# Patient Record
Sex: Female | Born: 1995 | Race: Black or African American | Hispanic: No | Marital: Single | Smoking: Former smoker
Health system: Southern US, Community
[De-identification: ages and names within clinical notes are randomized; demographics above are authoritative.]

## PROBLEM LIST (undated history)

## (undated) ENCOUNTER — Inpatient Hospital Stay (HOSPITAL_COMMUNITY): Payer: Self-pay

## (undated) ENCOUNTER — Ambulatory Visit (HOSPITAL_COMMUNITY): Admission: EM | Payer: Medicaid Other

## (undated) DIAGNOSIS — J45909 Unspecified asthma, uncomplicated: Secondary | ICD-10-CM

## (undated) DIAGNOSIS — F909 Attention-deficit hyperactivity disorder, unspecified type: Secondary | ICD-10-CM

## (undated) DIAGNOSIS — F4310 Post-traumatic stress disorder, unspecified: Secondary | ICD-10-CM

## (undated) DIAGNOSIS — O99810 Abnormal glucose complicating pregnancy: Secondary | ICD-10-CM

## (undated) DIAGNOSIS — F32A Depression, unspecified: Secondary | ICD-10-CM

## (undated) DIAGNOSIS — F329 Major depressive disorder, single episode, unspecified: Secondary | ICD-10-CM

## (undated) HISTORY — DX: Abnormal glucose complicating pregnancy: O99.810

## (undated) HISTORY — PX: NO PAST SURGERIES: SHX2092

---
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
## 2017-08-14 ENCOUNTER — Other Ambulatory Visit: Payer: Self-pay

## 2017-08-14 ENCOUNTER — Encounter (HOSPITAL_COMMUNITY): Payer: Self-pay | Admitting: Emergency Medicine

## 2017-08-14 ENCOUNTER — Emergency Department (HOSPITAL_COMMUNITY)
Admission: EM | Admit: 2017-08-14 | Discharge: 2017-08-14 | Disposition: A | Discharge status: Home / Self Care | Payer: Medicaid Other | Attending: Emergency Medicine | Admitting: Emergency Medicine

## 2017-08-14 DIAGNOSIS — F909 Attention-deficit hyperactivity disorder, unspecified type: Secondary | ICD-10-CM | POA: Diagnosis not present

## 2017-08-14 DIAGNOSIS — Z79899 Other long term (current) drug therapy: Secondary | ICD-10-CM | POA: Insufficient documentation

## 2017-08-14 DIAGNOSIS — J029 Acute pharyngitis, unspecified: Secondary | ICD-10-CM | POA: Diagnosis present

## 2017-08-14 MED ORDER — DIPHENHYDRAMINE HCL 25 MG PO TABS: 25.0000 mg | ORAL_TABLET | Freq: Four times a day (QID) | ORAL | 0 refills | Status: DC

## 2017-08-14 NOTE — ED Notes (Signed)
See EDP assessment 

## 2017-08-14 NOTE — Discharge Instructions (Addendum)
Please read attached information. If you experience any new or worsening signs or symptoms please return to the emergency room for evaluation. Please follow-up with your primary care provider or specialist as discussed. Please use medication prescribed only as directed and discontinue taking if you have any concerning signs or symptoms.   °

## 2017-08-14 NOTE — ED Notes (Signed)
Pt verbalizes understanding of d/c instructions. Pt received prescriptions. Pt ambulatory at d/c with all belongings.  

## 2017-08-14 NOTE — ED Triage Notes (Signed)
Pt was cleaning her house and thinks she got some of the product in her throat. Pt c/o pain only when she coughs or at night. Pt in no acute distress.

## 2017-08-14 NOTE — ED Provider Notes (Signed)
[facility] EMERGENCY DEPARTMENT Provider Note   CSN: [id number] Arrival date & time: 08/14/17  1418   History   Chief Complaint Chief Complaint  Patient presents with  . Sore Throat    HPI Bonnie Cobb is a 22 y.o. female.  HPI   22 year old female presents today with complaints of sore throat.  Patient is 5-day history of dry itchy throat.  She notes this is worse at night.  She notes that the [facility] has been on lately and blows over her bed.  Patient denies any difficulty swallowing, fever, runny nose or nasal congestion.  She denies any other complaints here today.    Past Medical History:  Diagnosis Date  . ADHD (attention deficit hyperactivity disorder)     There are no active problems to display for this patient.   History reviewed. No pertinent surgical history.   OB History   None      Home Medications    Prior to Admission medications   Medication Sig Start Date End Date Taking? Authorizing Provider  amoxicillin (AMOXIL) 500 MG capsule Take 1 capsule (500 mg total) by mouth 3 (three) times daily. 02/09/16   Elson Areas, PA-C  cloNIDine (CATAPRES) 0.2 MG tablet Take 0.2 mg by mouth 2 (two) times daily. 12/11/15   [provider]  cyclobenzaprine (FLEXERIL) 10 MG tablet Take 1 tablet (10 mg total) by mouth 2 (two) times daily as needed for muscle spasms. 12/18/15   Earley Favor, NP  diphenhydrAMINE (BENADRYL) 25 MG tablet Take 1 tablet (25 mg total) by mouth every 6 (six) hours. 08/14/17   Dayon Witt, Tinnie Gens, PA-C  HYDROcodone-acetaminophen (NORCO/VICODIN) 5-325 MG tablet Take 1 tablet by mouth every 6 (six) hours as needed for severe pain. 12/18/15   Earley Favor, NP  ibuprofen (ADVIL,MOTRIN) 800 MG tablet Take 1 tablet (800 mg total) by mouth every 8 (eight) hours as needed for moderate pain. 12/18/15   Earley Favor, NP  STRATTERA 40 MG capsule Take 40 mg by mouth every morning. 12/11/15   [provider]  traZODone (DESYREL)  100 MG tablet Take 100 mg by mouth at bedtime. 11/24/15   [provider]    Family History No family history on file.  Social History Social History   Tobacco Use  . Smoking status: Never Smoker  . Smokeless tobacco: Never Used  Substance Use Topics  . Alcohol use: No  . Drug use: No     Allergies   Patient has no known allergies.   Review of Systems Review of Systems  All other systems reviewed and are negative.    Physical Exam Updated Vital Signs BP 140/82   Pulse 99   Temp 98.4 F (36.9 C) (Oral)   Resp 18   Ht 5\' 3"  (1.6 m)   Wt 113.4 kg (250 lb)   LMP 08/13/2017   SpO2 99%   BMI 44.29 kg/m   Physical Exam  Constitutional: She is oriented to person, place, and time. She appears well-developed and well-nourished.  HENT:  Head: Normocephalic and atraumatic.  Eyes: Pupils are equal, round, and reactive to light. Conjunctivae are normal. Right eye exhibits no discharge. Left eye exhibits no discharge. No scleral icterus.  Neck: Normal range of motion. No JVD present. No tracheal deviation present.  Pulmonary/Chest: Effort normal. No stridor.  Neurological: She is alert and oriented to person, place, and time. Coordination normal.  Psychiatric: She has a normal mood and affect. Her behavior is normal. Judgment  and thought content normal.  Nursing note and vitals reviewed.    ED Treatments / Results  Labs (all labs ordered are listed, but only abnormal results are displayed) Labs Reviewed - No data to display  EKG None  Radiology No results found.  Procedures Procedures (including critical care time)  Medications Ordered in ED Medications - No data to display   Initial Impression / Assessment and Plan / ED Course  I have reviewed the triage vital signs and the nursing notes.  Pertinent labs & imaging results that were available during my care of the patient were reviewed by me and considered in my medical decision making (see chart  for details).    22 year old female presents today with complaints of sore throat.  She notes this is episodic, worse at night.  Question surrounding environment including AC unit versus allergies.  No signs of infectious etiology, no swelling or edema.  Patient well-appearing no acute distress.  Patient discharged with symptomatic care instructions and return precautions.  She verbalized understanding and agreement to today's plan  Final Clinical Impressions(s) / ED Diagnoses   Final diagnoses:  Sore throat    ED Discharge Orders        Ordered    diphenhydrAMINE (BENADRYL) 25 MG tablet  Every 6 hours     08/14/17 1512       Eyvonne MechanicHedges, Isaly Fasching, PA-C 08/14/17 1513    Benjiman CorePickering, Nathan, MD 08/14/17 2155

## 2017-08-15 ENCOUNTER — Emergency Department (HOSPITAL_COMMUNITY)
Admission: EM | Admit: 2017-08-15 | Discharge: 2017-08-15 | Disposition: A | Discharge status: Home / Self Care | Payer: Medicaid Other | Attending: Emergency Medicine | Admitting: Emergency Medicine

## 2017-08-15 ENCOUNTER — Other Ambulatory Visit: Payer: Self-pay

## 2017-08-15 ENCOUNTER — Encounter (HOSPITAL_COMMUNITY): Payer: Self-pay | Admitting: Emergency Medicine

## 2017-08-15 DIAGNOSIS — Z5321 Procedure and treatment not carried out due to patient leaving prior to being seen by health care provider: Secondary | ICD-10-CM | POA: Insufficient documentation

## 2017-08-15 DIAGNOSIS — M542 Cervicalgia: Secondary | ICD-10-CM | POA: Diagnosis present

## 2017-08-15 MED ORDER — IBUPROFEN 400 MG PO TABS
400.0000 mg | ORAL_TABLET | Freq: Once | ORAL | Status: AC
  Administered 2017-08-15: 400 mg via ORAL
  Filled 2017-08-15: qty 1
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
## 2017-08-15 NOTE — ED Notes (Signed)
Patient up to desk, states she no longer can wait.  This RN informed her of rooming.  Patient states she is going to leave now.  Will d/c.

## 2017-08-15 NOTE — ED Notes (Signed)
Patient updated on delays 

## 2017-08-15 NOTE — ED Notes (Signed)
Called Pt. to come back to room. Pt. Said "I have been waiting too long I'm leaving." This Tech asked Pt. If she was sure she wanted to leave and told her that she had a room. Pt. said "Give it to someone else."

## 2017-08-18 ENCOUNTER — Encounter (HOSPITAL_COMMUNITY): Payer: Self-pay | Admitting: Emergency Medicine

## 2017-08-18 ENCOUNTER — Emergency Department (HOSPITAL_COMMUNITY): Payer: Medicaid Other

## 2017-08-18 ENCOUNTER — Emergency Department (HOSPITAL_COMMUNITY)
Admission: EM | Admit: 2017-08-18 | Discharge: 2017-08-18 | Disposition: A | Discharge status: Home / Self Care | Payer: Medicaid Other | Attending: Emergency Medicine | Admitting: Emergency Medicine

## 2017-08-18 DIAGNOSIS — R1114 Bilious vomiting: Secondary | ICD-10-CM | POA: Insufficient documentation

## 2017-08-18 DIAGNOSIS — M542 Cervicalgia: Secondary | ICD-10-CM | POA: Insufficient documentation

## 2017-08-18 DIAGNOSIS — R112 Nausea with vomiting, unspecified: Secondary | ICD-10-CM | POA: Diagnosis present

## 2017-08-18 DIAGNOSIS — Z79899 Other long term (current) drug therapy: Secondary | ICD-10-CM | POA: Insufficient documentation

## 2017-08-18 LAB — COMPREHENSIVE METABOLIC PANEL
ALT: 16 U/L (ref 14–54)
AST: 20 U/L (ref 15–41)
Albumin: 3.7 g/dL (ref 3.5–5.0)
Alkaline Phosphatase: 62 U/L (ref 38–126)
Anion gap: 7 (ref 5–15)
BUN: 8 mg/dL (ref 6–20)
CALCIUM: 8.9 mg/dL (ref 8.9–10.3)
CO2: 26 mmol/L (ref 22–32)
Chloride: 104 mmol/L (ref 101–111)
Creatinine, Ser: 0.71 mg/dL (ref 0.44–1.00)
GFR calc Af Amer: >60 mL/min (ref >60)
GFR calc non Af Amer: >60 mL/min (ref >60)
Glucose, Bld: 97 mg/dL (ref 65–99)
Potassium: 3.9 mmol/L (ref 3.5–5.1)
SODIUM: 137 mmol/L (ref 135–145)
TOTAL PROTEIN: 7.4 g/dL (ref 6.5–8.1)
Total Bilirubin: 0.4 mg/dL (ref 0.3–1.2)

## 2017-08-18 LAB — I-STAT BETA HCG BLOOD, ED (MC, WL, AP ONLY)

## 2017-08-18 LAB — URINALYSIS, ROUTINE W REFLEX MICROSCOPIC
Bilirubin Urine: NEGATIVE
GLUCOSE, UA: NEGATIVE mg/dL
Hgb urine dipstick: NEGATIVE
KETONES UR: NEGATIVE mg/dL
LEUKOCYTES UA: NEGATIVE
Nitrite: NEGATIVE
PROTEIN: NEGATIVE mg/dL
Specific Gravity, Urine: 1.018 (ref 1.005–1.030)
pH: 8 (ref 5.0–8.0)

## 2017-08-18 LAB — LIPASE, BLOOD: LIPASE: 44 U/L (ref 11–51)

## 2017-08-18 MED ORDER — FAMOTIDINE 20 MG PO TABS: 20.0000 mg | ORAL_TABLET | Freq: Two times a day (BID) | ORAL | 0 refills | Status: DC

## 2017-08-18 MED ORDER — IBUPROFEN 400 MG PO TABS: 400.0000 mg | ORAL_TABLET | Freq: Three times a day (TID) | ORAL | 0 refills | Status: AC

## 2017-08-18 MED ORDER — SODIUM CHLORIDE 0.9 % IV BOLUS
1000.0000 mL | Freq: Once | INTRAVENOUS | Status: AC
  Administered 2017-08-18: 1000 mL via INTRAVENOUS

## 2017-08-18 MED ORDER — PROCHLORPERAZINE EDISYLATE 10 MG/2ML IJ SOLN
10.0000 mg | Freq: Once | INTRAMUSCULAR | Status: AC
  Administered 2017-08-18: 10 mg via INTRAVENOUS
  Filled 2017-08-18: qty 2

## 2017-08-18 NOTE — Discharge Instructions (Signed)
As discussed, your evaluation today has been largely reassuring.  But, it is important that you monitor your condition carefully, and do not hesitate to return to the ED if you develop new, or concerning changes in your condition. ? ?Otherwise, please follow-up with your physician for appropriate ongoing care. ? ?
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
## 2017-08-18 NOTE — ED Notes (Signed)
Patient transported to X-ray 

## 2017-08-18 NOTE — ED Provider Notes (Signed)
[facility] EMERGENCY DEPARTMENT Provider Note   CSN: [id number] Arrival date & time: 08/18/17  1416     History   Chief Complaint Chief Complaint  Patient presents with  . Neck Pain  . Emesis    HPI Bonnie Cobb is a 22 y.o. female.  HPI And female presents with multiple complaints. Seems as though she was well until the last day or so. She has had a cough during this period, possibly longer, though she is unsure. In addition, today the patient developed nausea, diarrhea, vomiting. She recalls eating a seafood dish prior to the onset of symptoms. Currently she has no pain, including no neck pain chest pain, abdominal pain, does have some ongoing nausea, no dyspnea, but with persistent cough.  Patient has a history of ADHD, denies other substantial medical problems. Nausea is improved, without clear intervention.  Past Medical History:  Diagnosis Date  . ADHD (attention deficit hyperactivity disorder)     There are no active problems to display for this patient.   History reviewed. No pertinent surgical history.   OB History   None      Home Medications    Prior to Admission medications   Medication Sig Start Date End Date Taking? Authorizing Provider  cloNIDine (CATAPRES) 0.2 MG tablet Take 0.2 mg by mouth 2 (two) times daily. 12/11/15  Yes [provider]  diphenhydrAMINE (BENADRYL) 25 MG tablet Take 1 tablet (25 mg total) by mouth every 6 (six) hours. 08/14/17  Yes Hedges, Tinnie Gens, PA-C  ibuprofen (ADVIL,MOTRIN) 800 MG tablet Take 1 tablet (800 mg total) by mouth every 8 (eight) hours as needed for moderate pain. 12/18/15  Yes Earley Favor, NP  naproxen sodium (ALEVE) 220 MG tablet Take 220 mg by mouth daily as needed (cramps).   Yes [provider]  STRATTERA 40 MG capsule Take 40 mg by mouth every morning. 12/11/15  Yes [provider]  traZODone (DESYREL) 100 MG tablet Take 100 mg by mouth at bedtime. 11/24/15   Yes [provider]  amoxicillin (AMOXIL) 500 MG capsule Take 1 capsule (500 mg total) by mouth 3 (three) times daily. Patient not taking: Reported on 08/18/2017 02/09/16   Elson Areas, PA-C  cyclobenzaprine (FLEXERIL) 10 MG tablet Take 1 tablet (10 mg total) by mouth 2 (two) times daily as needed for muscle spasms. Patient not taking: Reported on 08/18/2017 12/18/15   Earley Favor, NP  HYDROcodone-acetaminophen (NORCO/VICODIN) 5-325 MG tablet Take 1 tablet by mouth every 6 (six) hours as needed for severe pain. Patient not taking: Reported on 08/18/2017 12/18/15   Earley Favor, NP    Family History History reviewed. No pertinent family history.  Social History Social History   Tobacco Use  . Smoking status: Never Smoker  . Smokeless tobacco: Never Used  Substance Use Topics  . Alcohol use: No  . Drug use: No     Allergies   Patient has no known allergies.   Review of Systems Review of Systems  Constitutional:       Per HPI, otherwise negative  HENT:       Per HPI, otherwise negative  Respiratory:       Per HPI, otherwise negative  Cardiovascular:       Per HPI, otherwise negative  Gastrointestinal: Positive for diarrhea, nausea and vomiting.  Endocrine:       Negative aside from HPI  Genitourinary:       Neg aside from HPI   Musculoskeletal:  Per HPI, otherwise negative  Skin: Negative.   Neurological: Negative for syncope.     Physical Exam Updated Vital Signs BP 128/70 (BP Location: Right Arm)   Pulse 84   Temp 98.2 F (36.8 C) (Oral)   Resp 20   LMP 08/13/2017   SpO2 100%   Physical Exam  Constitutional: She is oriented to person, place, and time. She appears well-developed and well-nourished. No distress.  HENT:  Head: Normocephalic and atraumatic.  Eyes: Conjunctivae and EOM are normal.  Neck: Normal range of motion.  Patient actively moving her head out in all dimensions without any apparent discomfort, speaking clearly, in no  distress  Cardiovascular: Normal rate and regular rhythm.  Pulmonary/Chest: Effort normal and breath sounds normal. No stridor. No respiratory distress.  Abdominal: She exhibits no distension. There is no tenderness. There is no guarding.  Musculoskeletal: She exhibits no edema.  Neurological: She is alert and oriented to person, place, and time. No cranial nerve deficit.  Skin: Skin is warm and dry.  Psychiatric: She has a normal mood and affect.  Nursing note and vitals reviewed.    ED Treatments / Results  Labs (all labs ordered are listed, but only abnormal results are displayed) Labs Reviewed  LIPASE, BLOOD  COMPREHENSIVE METABOLIC PANEL  URINALYSIS, ROUTINE W REFLEX MICROSCOPIC  CBC  I-STAT BETA HCG BLOOD, ED ([facility], [facility], [facility] ONLY)    EKG None  Radiology Dg Chest 2 View  Result Date: 08/18/2017 CLINICAL DATA:  Cough for 2 days.  Nausea and vomiting. EXAM: CHEST - 2 VIEW COMPARISON:  12/18/2015 FINDINGS: The heart size and mediastinal contours are within normal limits. Both lungs are clear. The visualized skeletal structures are unremarkable. IMPRESSION: Negative.  No active cardiopulmonary disease. Electronically Signed   By: Myles RosenthalJohn  Stahl M.D.   On: 08/18/2017 17:20    Procedures Procedures (including critical care time)  Medications Ordered in ED Medications  sodium chloride 0.9 % bolus 1,000 mL (1,000 mLs Intravenous New Bag/Given 08/18/17 1813)  prochlorperazine (COMPAZINE) injection 10 mg (10 mg Intravenous Given 08/18/17 1816)     Initial Impression / Assessment and Plan / ED Course  I have reviewed the triage vital signs and the nursing notes.  Pertinent labs & imaging results that were available during my care of the patient were reviewed by me and considered in my medical decision making (see chart for details).     6:45 PM On repeat exam patient is awake, alert, in no distress, no vomiting, no hesitancy to move the neck and intervention, no fever, no altered  mental status. Findings here reassuring, no evidence for pneumonia, nor substantial electrolyte abnormalities. With no ongoing neurologic complaints, unremarkable neurologic exam, no ongoing neck pain, fever, and reassuring findings, the patient was discharged in stable condition with primary care follow-up.  Final Clinical Impressions(s) / ED Diagnoses  Nausea and vomiting Neck pain   Gerhard MunchLockwood, Nettye Flegal, MD 08/18/17 1846

## 2017-08-22 ENCOUNTER — Emergency Department (HOSPITAL_COMMUNITY)
Admission: EM | Admit: 2017-08-22 | Discharge: 2017-08-22 | Disposition: A | Discharge status: Home / Self Care | Payer: Medicaid Other | Attending: Emergency Medicine | Admitting: Emergency Medicine

## 2017-08-22 ENCOUNTER — Other Ambulatory Visit: Payer: Self-pay

## 2017-08-22 ENCOUNTER — Encounter (HOSPITAL_COMMUNITY): Payer: Self-pay | Admitting: Emergency Medicine

## 2017-08-22 DIAGNOSIS — S199XXA Unspecified injury of neck, initial encounter: Secondary | ICD-10-CM | POA: Diagnosis present

## 2017-08-22 DIAGNOSIS — Z79899 Other long term (current) drug therapy: Secondary | ICD-10-CM | POA: Insufficient documentation

## 2017-08-22 DIAGNOSIS — Y92410 Unspecified street and highway as the place of occurrence of the external cause: Secondary | ICD-10-CM | POA: Insufficient documentation

## 2017-08-22 DIAGNOSIS — S161XXA Strain of muscle, fascia and tendon at neck level, initial encounter: Secondary | ICD-10-CM

## 2017-08-22 DIAGNOSIS — Y998 Other external cause status: Secondary | ICD-10-CM | POA: Insufficient documentation

## 2017-08-22 DIAGNOSIS — Y939 Activity, unspecified: Secondary | ICD-10-CM | POA: Diagnosis not present

## 2017-08-22 MED ORDER — NAPROXEN 500 MG PO TABS: 500.0000 mg | ORAL_TABLET | Freq: Two times a day (BID) | ORAL | 0 refills | Status: DC

## 2017-08-22 MED ORDER — METHOCARBAMOL 500 MG PO TABS: 500.0000 mg | ORAL_TABLET | Freq: Every evening | ORAL | 0 refills | Status: DC | PRN

## 2017-08-22 NOTE — Discharge Instructions (Addendum)
Take naproxen 2 times a day with meals.  Do not take other anti-inflammatories at the same time open (Advil, Motrin, ibuprofen, Aleve). You may supplement with Tylenol if you need further pain control. Use robaxin as needed for muscle stiffness or soreness.  Have caution, this may make you tired or groggy.  Do not drive or operate heavy machinery while taking this medicine. Use ice packs or heating pads if this helps control your pain. Use muscle creams (salonpas, icy hot, bengay) as needed for further pain. You will likely have continued muscle stiffness and soreness over the next couple days.  Follow-up with primary care in 1 week if your symptoms are not improving. Return to the emergency room if you develop vision changes, vomiting, slurred speech, numbness, loss of bowel or bladder control, or any new or worsening symptoms.
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
## 2018-01-30 ENCOUNTER — Other Ambulatory Visit: Payer: Self-pay

## 2018-01-30 ENCOUNTER — Emergency Department (HOSPITAL_COMMUNITY): Payer: Medicaid Other

## 2018-01-30 ENCOUNTER — Emergency Department (HOSPITAL_COMMUNITY)
Admission: EM | Admit: 2018-01-30 | Discharge: 2018-01-30 | Disposition: A | Discharge status: Home / Self Care | Payer: Medicaid Other | Attending: Emergency Medicine | Admitting: Emergency Medicine

## 2018-01-30 ENCOUNTER — Encounter (HOSPITAL_COMMUNITY): Payer: Self-pay

## 2018-01-30 DIAGNOSIS — F17290 Nicotine dependence, other tobacco product, uncomplicated: Secondary | ICD-10-CM | POA: Diagnosis not present

## 2018-01-30 DIAGNOSIS — R0789 Other chest pain: Secondary | ICD-10-CM | POA: Diagnosis present

## 2018-01-30 DIAGNOSIS — T148XXA Other injury of unspecified body region, initial encounter: Secondary | ICD-10-CM | POA: Diagnosis not present

## 2018-01-30 DIAGNOSIS — Y929 Unspecified place or not applicable: Secondary | ICD-10-CM | POA: Diagnosis not present

## 2018-01-30 DIAGNOSIS — X58XXXA Exposure to other specified factors, initial encounter: Secondary | ICD-10-CM | POA: Diagnosis not present

## 2018-01-30 DIAGNOSIS — Z79899 Other long term (current) drug therapy: Secondary | ICD-10-CM | POA: Diagnosis not present

## 2018-01-30 DIAGNOSIS — Y999 Unspecified external cause status: Secondary | ICD-10-CM | POA: Insufficient documentation

## 2018-01-30 DIAGNOSIS — Y939 Activity, unspecified: Secondary | ICD-10-CM | POA: Diagnosis not present

## 2018-01-30 LAB — COMPREHENSIVE METABOLIC PANEL
ALK PHOS: 64 U/L (ref 38–126)
ALT: 23 U/L (ref 0–44)
AST: 29 U/L (ref 15–41)
Albumin: 4.3 g/dL (ref 3.5–5.0)
Anion gap: 8 (ref 5–15)
BUN: 11 mg/dL (ref 6–20)
CHLORIDE: 105 mmol/L (ref 98–111)
CO2: 26 mmol/L (ref 22–32)
Calcium: 9.2 mg/dL (ref 8.9–10.3)
Creatinine, Ser: 0.73 mg/dL (ref 0.44–1.00)
GFR calc Af Amer: >60 mL/min (ref >60)
GFR calc non Af Amer: >60 mL/min (ref >60)
GLUCOSE: 93 mg/dL (ref 70–99)
Potassium: 3.7 mmol/L (ref 3.5–5.1)
Sodium: 139 mmol/L (ref 135–145)
Total Bilirubin: 0.7 mg/dL (ref 0.3–1.2)
Total Protein: 8.2 g/dL — ABNORMAL HIGH (ref 6.5–8.1)

## 2018-01-30 LAB — URINALYSIS, ROUTINE W REFLEX MICROSCOPIC
Bilirubin Urine: NEGATIVE
Glucose, UA: NEGATIVE mg/dL
HGB URINE DIPSTICK: NEGATIVE
Ketones, ur: NEGATIVE mg/dL
Nitrite: NEGATIVE
PROTEIN: NEGATIVE mg/dL
Specific Gravity, Urine: 1.021 (ref 1.005–1.030)
pH: 6 (ref 5.0–8.0)

## 2018-01-30 LAB — CBC
HEMATOCRIT: 36.4 % (ref 36.0–46.0)
HEMOGLOBIN: 11.3 g/dL — AB (ref 12.0–15.0)
MCH: 25.2 pg — AB (ref 26.0–34.0)
MCHC: 31 g/dL (ref 30.0–36.0)
MCV: 81.1 fL (ref 80.0–100.0)
Platelets: 382 10*3/uL (ref 150–400)
RBC: 4.49 MIL/uL (ref 3.87–5.11)
RDW: 15.9 % — ABNORMAL HIGH (ref 11.5–15.5)
WBC: 8.7 10*3/uL (ref 4.0–10.5)
nRBC: 0 % (ref 0.0–0.2)

## 2018-01-30 LAB — LIPASE, BLOOD: Lipase: 34 U/L (ref 11–51)

## 2018-01-30 LAB — I-STAT BETA HCG BLOOD, ED (MC, WL, AP ONLY)

## 2018-01-30 MED ORDER — ACETAMINOPHEN 325 MG PO TABS
650.0000 mg | ORAL_TABLET | Freq: Once | ORAL | Status: AC
  Administered 2018-01-30: 650 mg via ORAL
  Filled 2018-01-30: qty 2

## 2018-01-30 NOTE — ED Triage Notes (Signed)
Patient c/o abdominal cramping x 2 months. patient states she could be pregnant. Patient c/o N/V x 1 week.

## 2018-01-30 NOTE — ED Provider Notes (Signed)
[facility]-EMERGENCY DEPT Provider Note   CSN: [id number] Arrival date & time: 01/30/18  1305     History   Chief Complaint Chief Complaint  Patient presents with  . Abdominal Pain    HPI Bonnie Cobb is a 22 y.o. female.  The history is provided by the patient.  Illness  This is a new problem. The current episode started more than 2 days ago. The problem occurs daily. The problem has not changed since onset.Associated symptoms include chest pain (rib pain on left). Pertinent negatives include no abdominal pain, no headaches and no shortness of breath. Nothing aggravates the symptoms. Nothing relieves the symptoms. She has tried nothing for the symptoms. The treatment provided no relief.    Past Medical History:  Diagnosis Date  . ADHD (attention deficit hyperactivity disorder)     There are no active problems to display for this patient.   History reviewed. No pertinent surgical history.   OB History   None      Home Medications    Prior to Admission medications   Medication Sig Start Date End Date Taking? Authorizing Provider  diphenhydrAMINE (BENADRYL) 25 MG tablet Take 1 tablet (25 mg total) by mouth every 6 (six) hours. 08/14/17  Yes Hedges, Tinnie GensJeffrey, PA-C  Prenatal Vit-Fe Fumarate-FA (MULTIVITAMIN-PRENATAL) 27-0.8 MG TABS tablet Take 1 tablet by mouth daily at 12 noon.   Yes [provider]  cloNIDine (CATAPRES) 0.2 MG tablet Take 0.2 mg by mouth 2 (two) times daily. 12/11/15   [provider]  famotidine (PEPCID) 20 MG tablet Take 1 tablet (20 mg total) by mouth 2 (two) times daily. Take one tablet twice daily for two days 08/18/17   Gerhard MunchLockwood, Robert, MD  methocarbamol (ROBAXIN) 500 MG tablet Take 1 tablet (500 mg total) by mouth at bedtime as needed for muscle spasms. Patient not taking: Reported on 01/30/2018 08/22/17   Caccavale, Sophia, PA-C  naproxen (NAPROSYN) 500 MG tablet Take 1 tablet (500 mg total) by mouth 2  (two) times daily with a meal. Patient not taking: Reported on 01/30/2018 08/22/17   Caccavale, Sophia, PA-C  STRATTERA 40 MG capsule Take 40 mg by mouth every morning. 12/11/15   [provider]  traZODone (DESYREL) 100 MG tablet Take 100 mg by mouth at bedtime. 11/24/15   [provider]    Family History Family History  Problem Relation Age of Onset  . Healthy Mother   . Healthy Father     Social History Social History   Tobacco Use  . Smoking status: Current Some Day Smoker    Types: Cigars  . Smokeless tobacco: Never Used  Substance Use Topics  . Alcohol use: No  . Drug use: No     Allergies   Patient has no known allergies.   Review of Systems Review of Systems  Constitutional: Negative for chills and fever.  HENT: Negative for ear pain and sore throat.   Eyes: Negative for pain and visual disturbance.  Respiratory: Negative for cough and shortness of breath.   Cardiovascular: Positive for chest pain (rib pain on left). Negative for palpitations.  Gastrointestinal: Positive for nausea (resolved) and vomiting. Negative for abdominal pain.  Genitourinary: Negative for dysuria and hematuria.  Musculoskeletal: Negative for arthralgias and back pain.  Skin: Negative for color change and rash.  Neurological: Negative for seizures, syncope and headaches.  All other systems reviewed and are negative.    Physical Exam Updated Vital Signs  ED Triage Vitals  Enc Vitals Group     BP 01/30/18 1315 127/74     Pulse Rate 01/30/18 1315 96     Resp 01/30/18 1315 20     Temp 01/30/18 1315 98.3 F (36.8 C)     Temp Source 01/30/18 1315 Oral     SpO2 01/30/18 1315 98 %     Weight 01/30/18 1315 245 lb (111.1 kg)     Height 01/30/18 1315 5\' 3"  (1.6 m)     Head Circumference --      Peak Flow --      Pain Score 01/30/18 1326 7     Pain Loc --      Pain Edu? --      Excl. in GC? --     Physical Exam  Constitutional: She appears well-developed and  well-nourished. No distress.  HENT:  Head: Normocephalic and atraumatic.  Eyes: Pupils are equal, round, and reactive to light. Conjunctivae and EOM are normal.  Neck: Neck supple.  Cardiovascular: Normal rate, regular rhythm, normal heart sounds and intact distal pulses.  No murmur heard. Pulmonary/Chest: Effort normal and breath sounds normal. No respiratory distress.  Abdominal: Soft. Normal appearance and bowel sounds are normal. There is no tenderness.  Musculoskeletal: She exhibits no edema.  Neurological: She is alert.  Skin: Skin is warm and dry. Capillary refill takes less than 2 seconds.  Psychiatric: She has a normal mood and affect.  Nursing note and vitals reviewed.    ED Treatments / Results  Labs (all labs ordered are listed, but only abnormal results are displayed) Labs Reviewed  COMPREHENSIVE METABOLIC PANEL - Abnormal; Notable for the following components:      Result Value   Total Protein 8.2 (*)    All other components within normal limits  CBC - Abnormal; Notable for the following components:   Hemoglobin 11.3 (*)    MCH 25.2 (*)    RDW 15.9 (*)    All other components within normal limits  URINALYSIS, ROUTINE W REFLEX MICROSCOPIC - Abnormal; Notable for the following components:   Leukocytes, UA LARGE (*)    Bacteria, UA RARE (*)    All other components within normal limits  LIPASE, BLOOD  I-STAT BETA HCG BLOOD, ED ([facility], [facility], [facility] ONLY)    EKG None  Radiology Dg Chest 2 View  Result Date: 01/30/2018 CLINICAL DATA:  Chest pain EXAM: CHEST - 2 VIEW COMPARISON:  August 18, 2017 FINDINGS: Lungs are clear. Heart size and pulmonary vascularity are normal. No adenopathy. No pneumothorax. No evident bone lesions. IMPRESSION: No edema or consolidation. Electronically Signed   By: Bretta Bang III M.D.   On: 01/30/2018 14:10    Procedures Procedures (including critical care time)  Medications Ordered in ED Medications  acetaminophen (TYLENOL) tablet 650  mg (650 mg Oral Given 01/30/18 1409)     Initial Impression / Assessment and Plan / ED Course  I have reviewed the triage vital signs and the nursing notes.  Pertinent labs & imaging results that were available during my care of the patient were reviewed by me and considered in my medical decision making (see chart for details).     Bonnie Cobb is a 22 year old female with no significant medical history who presents to the ED with concern for pregnancy, rib pain.  Patient with normal vitals.  No fever.  Patient with nausea vomiting for the past week but asymptomatic now.  Had a negative pregnancy test upon arrival.  No concern for  pregnancy.  Patient with some tenderness of the left side of her ribs.  Otherwise exam is unremarkable.  No abdominal tenderness on exam.  Lab work showed no acute leukocytosis, anemia, electrolyte abnormality, kidney injury.  Chest x-ray showed no signs of pneumonia, pneumothorax, pleural effusion. Gallbladder, liver and pancreas enzymes all okay. Doubt inta-abdominal process. Possible viral process. Patient was given Tylenol for likely bone contusion.  Recommend follow-up with primary care doctor and discharged from ED in good condition.  This chart was dictated using voice recognition software.  Despite best efforts to proofread,  errors can occur which can change the documentation meaning.   Final Clinical Impressions(s) / ED Diagnoses   Final diagnoses:  Muscle contusion    ED Discharge Orders    None       Virgina Norfolk, DO 01/30/18 1605

## 2018-01-30 NOTE — ED Notes (Signed)
Pt made aware of need for urine sample. In bathroom at this time.

## 2018-03-11 ENCOUNTER — Emergency Department (HOSPITAL_COMMUNITY)
Admission: EM | Admit: 2018-03-11 | Discharge: 2018-03-11 | Disposition: A | Discharge status: Home / Self Care | Payer: Medicaid Other | Attending: Emergency Medicine | Admitting: Emergency Medicine

## 2018-03-11 ENCOUNTER — Emergency Department (HOSPITAL_COMMUNITY): Payer: Medicaid Other

## 2018-03-11 ENCOUNTER — Encounter (HOSPITAL_COMMUNITY): Payer: Self-pay | Admitting: Emergency Medicine

## 2018-03-11 ENCOUNTER — Other Ambulatory Visit: Payer: Self-pay

## 2018-03-11 DIAGNOSIS — Z79899 Other long term (current) drug therapy: Secondary | ICD-10-CM | POA: Insufficient documentation

## 2018-03-11 DIAGNOSIS — F909 Attention-deficit hyperactivity disorder, unspecified type: Secondary | ICD-10-CM | POA: Insufficient documentation

## 2018-03-11 DIAGNOSIS — F341 Dysthymic disorder: Secondary | ICD-10-CM | POA: Insufficient documentation

## 2018-03-11 DIAGNOSIS — F17210 Nicotine dependence, cigarettes, uncomplicated: Secondary | ICD-10-CM | POA: Insufficient documentation

## 2018-03-11 DIAGNOSIS — R4589 Other symptoms and signs involving emotional state: Secondary | ICD-10-CM

## 2018-03-11 DIAGNOSIS — R0789 Other chest pain: Secondary | ICD-10-CM | POA: Diagnosis present

## 2018-03-11 LAB — COMPREHENSIVE METABOLIC PANEL
ALBUMIN: 4 g/dL (ref 3.5–5.0)
ALT: 18 U/L (ref 0–44)
AST: 25 U/L (ref 15–41)
Alkaline Phosphatase: 50 U/L (ref 38–126)
Anion gap: 11 (ref 5–15)
BUN: 10 mg/dL (ref 6–20)
CHLORIDE: 103 mmol/L (ref 98–111)
CO2: 24 mmol/L (ref 22–32)
Calcium: 9.1 mg/dL (ref 8.9–10.3)
Creatinine, Ser: 0.74 mg/dL (ref 0.44–1.00)
GFR calc non Af Amer: >60 mL/min (ref >60)
Glucose, Bld: 102 mg/dL — ABNORMAL HIGH (ref 70–99)
POTASSIUM: 3.5 mmol/L (ref 3.5–5.1)
SODIUM: 138 mmol/L (ref 135–145)
Total Bilirubin: 0.6 mg/dL (ref 0.3–1.2)
Total Protein: 7.3 g/dL (ref 6.5–8.1)

## 2018-03-11 LAB — CBC WITH DIFFERENTIAL/PLATELET
ABS IMMATURE GRANULOCYTES: 0.01 10*3/uL (ref 0.00–0.07)
BASOS ABS: 0 10*3/uL (ref 0.0–0.1)
Basophils Relative: 1 %
Eosinophils Absolute: 0.1 10*3/uL (ref 0.0–0.5)
Eosinophils Relative: 1 %
HCT: 36.7 % (ref 36.0–46.0)
Hemoglobin: 11.3 g/dL — ABNORMAL LOW (ref 12.0–15.0)
IMMATURE GRANULOCYTES: 0 %
LYMPHS ABS: 3.8 10*3/uL (ref 0.7–4.0)
Lymphocytes Relative: 51 %
MCH: 24.5 pg — ABNORMAL LOW (ref 26.0–34.0)
MCHC: 30.8 g/dL (ref 30.0–36.0)
MCV: 79.4 fL — ABNORMAL LOW (ref 80.0–100.0)
MONOS PCT: 8 %
Monocytes Absolute: 0.6 10*3/uL (ref 0.1–1.0)
NEUTROS ABS: 2.8 10*3/uL (ref 1.7–7.7)
NEUTROS PCT: 39 %
NRBC: 0 % (ref 0.0–0.2)
Platelets: 371 10*3/uL (ref 150–400)
RBC: 4.62 MIL/uL (ref 3.87–5.11)
RDW: 16.8 % — ABNORMAL HIGH (ref 11.5–15.5)
WBC: 7.3 10*3/uL (ref 4.0–10.5)

## 2018-03-11 LAB — HCG, QUANTITATIVE, PREGNANCY

## 2018-03-11 LAB — LIPASE, BLOOD: Lipase: 37 U/L (ref 11–51)

## 2018-03-11 NOTE — ED Notes (Signed)
Patient given discharge teaching and verbalized understanding. Patient ambulated out of ED with a steady gait. 

## 2018-03-11 NOTE — ED Triage Notes (Signed)
Patient brought in [facility]. Patient is coming from rehab facility. Patient is in rehab for drugs. Patient is complaining of right breast pain. Patient is hyperventilating. Patient is also complaining of depression.

## 2018-03-11 NOTE — Discharge Instructions (Addendum)
Contact a doctor if: You have a fever. Your chest pain gets worse. You have new symptoms. Get help right away if: You feel sick to your stomach (nauseous) or you throw up (vomit). You feel sweaty or light-headed. You have a cough with mucus from your lungs (sputum) or you cough up blood. You are short of breath.

## 2018-03-13 NOTE — ED Provider Notes (Signed)
[facility] DEPT Provider Note   CSN: [id number] Arrival date & time: 03/11/18  0007     History   Chief Complaint Chief Complaint  Patient presents with  . Breast Pain  . Depression    HPI Bonnie Cobb is a 23 y.o. female who presents with cc of R chest pain.  Patient states that she is having cramping pain under her right breast breast and right upper abdominal quadrant.  She states that when it occurs it makes her feel short of breath.  She denies constant pain and has no pain at this time.  She denies hemoptysis, cough, abdominal pain, nausea or vomiting.  Patient states that she is also somewhat depressed however she denies suicidal or homicidal ideation and does not feel she needs inpatient psychiatric admission.  HPI  Past Medical History:  Diagnosis Date  . ADHD (attention deficit hyperactivity disorder)     There are no active problems to display for this patient.   History reviewed. No pertinent surgical history.   OB History   No obstetric history on file.      Home Medications    Prior to Admission medications   Medication Sig Start Date End Date Taking? Authorizing Provider  cloNIDine (CATAPRES) 0.2 MG tablet Take 0.2 mg by mouth daily.  12/11/15  Yes [provider]  Prenatal Vit-Fe Fumarate-FA (MULTIVITAMIN-PRENATAL) 27-0.8 MG TABS tablet Take 1 tablet by mouth daily at 12 noon.   Yes [provider]  STRATTERA 40 MG capsule Take 40 mg by mouth every morning. 12/11/15  Yes [provider]  traZODone (DESYREL) 100 MG tablet Take 100 mg by mouth at bedtime. 11/24/15  Yes [provider]  diphenhydrAMINE (BENADRYL) 25 MG tablet Take 1 tablet (25 mg total) by mouth every 6 (six) hours. Patient not taking: Reported on 03/11/2018 08/14/17   Hedges, Dellis Filbert, PA-C  famotidine (PEPCID) 20 MG tablet Take 1 tablet (20 mg total) by mouth 2 (two) times daily. Take one tablet twice daily for two  days Patient not taking: Reported on 03/11/2018 08/18/17   Carmin Muskrat, MD  methocarbamol (ROBAXIN) 500 MG tablet Take 1 tablet (500 mg total) by mouth at bedtime as needed for muscle spasms. Patient not taking: Reported on 01/30/2018 08/22/17   Caccavale, Sophia, PA-C  naproxen (NAPROSYN) 500 MG tablet Take 1 tablet (500 mg total) by mouth 2 (two) times daily with a meal. Patient not taking: Reported on 01/30/2018 08/22/17   Caccavale, Jillyn Ledger, PA-C    Family History Family History  Problem Relation Age of Onset  . Healthy Mother   . Healthy Father     Social History Social History   Tobacco Use  . Smoking status: Current Some Day Smoker    Types: Cigars  . Smokeless tobacco: Never Used  Substance Use Topics  . Alcohol use: No  . Drug use: No     Allergies   Patient has no known allergies.   Review of Systems Review of Systems Ten systems reviewed and are negative for acute change, except as noted in the HPI.    Physical Exam Updated Vital Signs BP 107/70   Pulse 78   Temp 98.8 F (37.1 C) (Oral)   Resp 14   Ht 5' 3.5" (1.613 m)   Wt 108.4 kg   LMP 02/21/2018   SpO2 100%   BMI 41.67 kg/m   Physical Exam Vitals signs and nursing note reviewed.  Constitutional:      General:  She is not in acute distress.    Appearance: She is well-developed. She is not diaphoretic.  HENT:     Head: Normocephalic and atraumatic.  Eyes:     General: No scleral icterus.    Conjunctiva/sclera: Conjunctivae normal.  Neck:     Musculoskeletal: Normal range of motion.  Cardiovascular:     Rate and Rhythm: Normal rate and regular rhythm.     Heart sounds: Normal heart sounds. No murmur. No friction rub. No gallop.   Pulmonary:     Effort: Pulmonary effort is normal. No respiratory distress.     Breath sounds: Normal breath sounds.  Chest:    Abdominal:     General: Bowel sounds are normal. There is no distension.     Palpations: Abdomen is soft. There is no mass.      Tenderness: There is abdominal tenderness in the right upper quadrant. There is no guarding.  Skin:    General: Skin is warm and dry.     Findings: No rash.  Neurological:     Mental Status: She is alert and oriented to person, place, and time.  Psychiatric:        Behavior: Behavior normal.      ED Treatments / Results  Labs (all labs ordered are listed, but only abnormal results are displayed) Labs Reviewed  COMPREHENSIVE METABOLIC PANEL - Abnormal; Notable for the following components:      Result Value   Glucose, Bld 102 (*)    All other components within normal limits  CBC WITH DIFFERENTIAL/PLATELET - Abnormal; Notable for the following components:   Hemoglobin 11.3 (*)    MCV 79.4 (*)    MCH 24.5 (*)    RDW 16.8 (*)    All other components within normal limits  LIPASE, BLOOD  HCG, QUANTITATIVE, PREGNANCY    EKG None  Radiology No results found.  Procedures Procedures (including critical care time)  Medications Ordered in ED Medications - No data to display   Initial Impression / Assessment and Plan / ED Course  I have reviewed the triage vital signs and the nursing notes.  Pertinent labs & imaging results that were available during my care of the patient were reviewed by me and considered in my medical decision making (see chart for details).     Patient with chest wall pain.  Patient does not have elevated liver enzymes suggestive of cholecystitis.    She has a mildly low hemoglobin and met microcytic anemia likely secondary to normal menstruation. I personally reviewed the patient's chest x-ray which shows no acute abnormalities.  She has no active chest pain at this time and is PERC negative.  I feel she is appropriate for discharge.  Patient's pain is reproducible with chest wall palpation.  Final Clinical Impressions(s) / ED Diagnoses   Final diagnoses:  Chest wall pain  Dysphoric mood    ED Discharge Orders    None       Margarita Mail,  PA-C 03/13/18 6415    Shanon Rosser, MD 03/13/18 [phone]

## 2018-05-27 ENCOUNTER — Other Ambulatory Visit: Payer: Self-pay

## 2018-05-27 ENCOUNTER — Encounter (HOSPITAL_COMMUNITY): Payer: Self-pay | Admitting: *Deleted

## 2018-05-27 ENCOUNTER — Inpatient Hospital Stay (HOSPITAL_COMMUNITY): Payer: Medicaid Other

## 2018-05-27 ENCOUNTER — Inpatient Hospital Stay (HOSPITAL_COMMUNITY)
Admission: AD | Admit: 2018-05-27 | Discharge: 2018-05-27 | Disposition: A | Discharge status: Home / Self Care | Payer: Medicaid Other | Attending: Obstetrics and Gynecology | Admitting: Obstetrics and Gynecology

## 2018-05-27 DIAGNOSIS — O9989 Other specified diseases and conditions complicating pregnancy, childbirth and the puerperium: Secondary | ICD-10-CM | POA: Insufficient documentation

## 2018-05-27 DIAGNOSIS — O99341 Other mental disorders complicating pregnancy, first trimester: Secondary | ICD-10-CM | POA: Insufficient documentation

## 2018-05-27 DIAGNOSIS — O99331 Smoking (tobacco) complicating pregnancy, first trimester: Secondary | ICD-10-CM | POA: Diagnosis not present

## 2018-05-27 DIAGNOSIS — Z3A01 Less than 8 weeks gestation of pregnancy: Secondary | ICD-10-CM | POA: Diagnosis not present

## 2018-05-27 DIAGNOSIS — Z791 Long term (current) use of non-steroidal anti-inflammatories (NSAID): Secondary | ICD-10-CM | POA: Diagnosis not present

## 2018-05-27 DIAGNOSIS — Z79899 Other long term (current) drug therapy: Secondary | ICD-10-CM | POA: Diagnosis not present

## 2018-05-27 DIAGNOSIS — O26899 Other specified pregnancy related conditions, unspecified trimester: Secondary | ICD-10-CM

## 2018-05-27 DIAGNOSIS — F17290 Nicotine dependence, other tobacco product, uncomplicated: Secondary | ICD-10-CM | POA: Diagnosis not present

## 2018-05-27 DIAGNOSIS — R109 Unspecified abdominal pain: Secondary | ICD-10-CM | POA: Insufficient documentation

## 2018-05-27 DIAGNOSIS — F909 Attention-deficit hyperactivity disorder, unspecified type: Secondary | ICD-10-CM | POA: Insufficient documentation

## 2018-05-27 DIAGNOSIS — O219 Vomiting of pregnancy, unspecified: Secondary | ICD-10-CM | POA: Insufficient documentation

## 2018-05-27 DIAGNOSIS — O26891 Other specified pregnancy related conditions, first trimester: Secondary | ICD-10-CM | POA: Diagnosis not present

## 2018-05-27 LAB — WET PREP, GENITAL
Clue Cells Wet Prep HPF POC: NONE SEEN
SPERM: NONE SEEN
Trich, Wet Prep: NONE SEEN
YEAST WET PREP: NONE SEEN

## 2018-05-27 LAB — CBC WITH DIFFERENTIAL/PLATELET
Abs Immature Granulocytes: 0.02 10*3/uL (ref 0.00–0.07)
Basophils Absolute: 0 10*3/uL (ref 0.0–0.1)
Basophils Relative: 0 %
Eosinophils Absolute: 0.1 10*3/uL (ref 0.0–0.5)
Eosinophils Relative: 1 %
HEMATOCRIT: 36.8 % (ref 36.0–46.0)
HEMOGLOBIN: 11.7 g/dL — AB (ref 12.0–15.0)
Immature Granulocytes: 0 %
LYMPHS PCT: 43 %
Lymphs Abs: 3.6 10*3/uL (ref 0.7–4.0)
MCH: 24.5 pg — ABNORMAL LOW (ref 26.0–34.0)
MCHC: 31.8 g/dL (ref 30.0–36.0)
MCV: 77 fL — ABNORMAL LOW (ref 80.0–100.0)
Monocytes Absolute: 0.4 10*3/uL (ref 0.1–1.0)
Monocytes Relative: 5 %
Neutro Abs: 4.1 10*3/uL (ref 1.7–7.7)
Neutrophils Relative %: 51 %
Platelets: 350 10*3/uL (ref 150–400)
RBC: 4.78 MIL/uL (ref 3.87–5.11)
RDW: 15.5 % (ref 11.5–15.5)
WBC: 8.2 10*3/uL (ref 4.0–10.5)
nRBC: 0 % (ref 0.0–0.2)

## 2018-05-27 LAB — URINALYSIS, ROUTINE W REFLEX MICROSCOPIC
BILIRUBIN URINE: NEGATIVE
Glucose, UA: NEGATIVE mg/dL
Hgb urine dipstick: NEGATIVE
Ketones, ur: NEGATIVE mg/dL
Leukocytes,Ua: NEGATIVE
NITRITE: NEGATIVE
PH: 6 (ref 5.0–8.0)
Protein, ur: NEGATIVE mg/dL
SPECIFIC GRAVITY, URINE: 1.017 (ref 1.005–1.030)

## 2018-05-27 LAB — HCG, QUANTITATIVE, PREGNANCY: HCG, BETA CHAIN, QUANT, S: 38094 m[IU]/mL — AB (ref <5)

## 2018-05-27 LAB — POCT PREGNANCY, URINE: Preg Test, Ur: POSITIVE — AB

## 2018-05-27 NOTE — Discharge Instructions (Signed)
Abdominal Pain During Pregnancy ° °Abdominal pain is common during pregnancy, and has many possible causes. Some causes are more serious than others, and sometimes the cause is not known. Abdominal pain can be a sign that labor is starting. It can also be caused by normal growth and stretching of muscles and ligaments during pregnancy. Always tell your health care provider if you have any abdominal pain. °Follow these instructions at home: °· Do not have sex or put anything in your vagina until your pain goes away completely. °· Get plenty of rest until your pain improves. °· Drink enough fluid to keep your urine pale yellow. °· Take over-the-counter and prescription medicines only as told by your health care provider. °· Keep all follow-up visits as told by your health care provider. This is important. °Contact a health care provider if: °· Your pain continues or gets worse after resting. °· You have lower abdominal pain that: °? Comes and goes at regular intervals. °? Spreads to your back. °? Is similar to menstrual cramps. °· You have pain or burning when you urinate. °Get help right away if: °· You have a fever or chills. °· You have vaginal bleeding. °· You are leaking fluid from your vagina. °· You are passing tissue from your vagina. °· You have vomiting or diarrhea that lasts for more than 24 hours. °· Your baby is moving less than usual. °· You feel very weak or faint. °· You have shortness of breath. °· You develop severe pain in your upper abdomen. °Summary °· Abdominal pain is common during pregnancy, and has many possible causes. °· If you experience abdominal pain during pregnancy, tell your health care provider right away. °· Follow your health care provider's home care instructions and keep all follow-up visits as directed. °This information is not intended to replace advice given to you by your health care provider. Make sure you discuss any questions you have with your health care  provider. °Document Released: 02/26/2005 Document Revised: 05/31/2016 Document Reviewed: 05/31/2016 °Elsevier Interactive Patient Education © 2019 [employer] ° °Safe Medications in Pregnancy  ° °Acne: °Benzoyl Peroxide °Salicylic Acid ° °Backache/Headache: °Tylenol: 2 regular strength every 4 hours OR °             2 Extra strength every 6 hours ° °Colds/Coughs/Allergies: °Benadryl (alcohol free) 25 mg every 6 hours as needed °Breath right strips °Claritin °Cepacol throat lozenges °Chloraseptic throat spray °Cold-Eeze- up to three times per day °Cough drops, alcohol free °Flonase (by prescription only) °Guaifenesin °Mucinex °Robitussin DM (plain only, alcohol free) °Saline nasal spray/drops °Sudafed (pseudoephedrine) & Actifed ** use only after [redacted] weeks gestation and if you do not have high blood pressure °Tylenol °Vicks Vaporub °Zinc lozenges °Zyrtec  ° °Constipation: °Colace °Ducolax suppositories °Fleet enema °Glycerin suppositories °Metamucil °Milk of magnesia °Miralax °Senokot °Smooth move tea ° °Diarrhea: °Kaopectate °Imodium A-D ° °*NO pepto Bismol ° °Hemorrhoids: °Anusol °Anusol HC °Preparation H °Tucks ° °Indigestion: °Tums °Maalox °Mylanta °Zantac  °Pepcid ° °Insomnia: °Benadryl (alcohol free) 25mg every 6 hours as needed °Tylenol PM °Unisom, no Gelcaps ° °Leg Cramps: °Tums °MagGel ° °Nausea/Vomiting:  °Bonine °Dramamine °Emetrol °Ginger extract °Sea bands °Meclizine  °Nausea medication to take during pregnancy:  °Unisom (doxylamine succinate 25 mg tablets) Take one tablet daily at bedtime. If symptoms are not adequately controlled, the dose can be increased to a maximum recommended dose of two tablets daily (1/2 tablet in the morning, 1/2 tablet mid-afternoon and one at bedtime). °Vitamin B6 100mg   tablets daily (1/2 tablet in the morning, 1/2 tablet mid-afternoon and one at bedtime). Vitamin B6 100mg  tablets. Take one tablet twice a day (up to 200 mg per day).  Skin Rashes: Aveeno products Benadryl cream or 25mg  every 6 hours as needed Calamine Lotion 1% cortisone cream  Yeast  infection: Gyne-lotrimin 7 Monistat 7   **If taking multiple medications, please check labels to avoid duplicating the same active ingredients **take medication as directed on the label ** Do not exceed 4000 mg of tylenol in 24 hours **Do not take medications that contain aspirin or ibuprofen

## 2018-05-27 NOTE — MAU Provider Note (Addendum)
History     CSN: [id number]  Arrival date and time: 05/27/18 7619   First Provider Initiated Contact with Patient 05/27/18 2015      Chief Complaint  Patient presents with  . Abdominal Pain   HPI Bonnie Cobb is a 23 y.o. G1P0000 at [redacted]w[redacted]d who presents to MAU today with complaint of intermittent abdominal cramping today. She has had both positive and negative tests at home. She denies vaginal bleeding or abnormal discharge. She has not taken anything for pain. She denies pain now. She has had N/V over the last few days as well. She has not taken anything for that either. She states LMP late January.   OB History    Gravida  1   Para      Term      Preterm      AB  0   Living        SAB  0   TAB      Ectopic      Multiple      Live Births              Past Medical History:  Diagnosis Date  . ADHD (attention deficit hyperactivity disorder)     No past surgical history on file.  Family History  Problem Relation Age of Onset  . Healthy Mother   . Healthy Father     Social History   Tobacco Use  . Smoking status: Current Some Day Smoker    Types: Cigars  . Smokeless tobacco: Never Used  Substance Use Topics  . Alcohol use: No  . Drug use: No    Allergies: No Known Allergies  Medications Prior to Admission  Medication Sig Dispense Refill Last Dose  . cloNIDine (CATAPRES) 0.2 MG tablet Take 0.2 mg by mouth daily.   2 Past Week at Unknown time  . diphenhydrAMINE (BENADRYL) 25 MG tablet Take 1 tablet (25 mg total) by mouth every 6 (six) hours. (Patient not taking: Reported on 03/11/2018) 20 tablet 0 Not Taking at Unknown time  . famotidine (PEPCID) 20 MG tablet Take 1 tablet (20 mg total) by mouth 2 (two) times daily. Take one tablet twice daily for two days (Patient not taking: Reported on 03/11/2018) 30 tablet 0 Not Taking at Unknown time  . methocarbamol (ROBAXIN) 500 MG tablet Take 1 tablet (500 mg total) by mouth at bedtime as needed for  muscle spasms. (Patient not taking: Reported on 01/30/2018) 5 tablet 0 Completed Course at Unknown time  . naproxen (NAPROSYN) 500 MG tablet Take 1 tablet (500 mg total) by mouth 2 (two) times daily with a meal. (Patient not taking: Reported on 01/30/2018) 30 tablet 0 Completed Course at Unknown time  . Prenatal Vit-Fe Fumarate-FA (MULTIVITAMIN-PRENATAL) 27-0.8 MG TABS tablet Take 1 tablet by mouth daily at 12 noon.   Past Month at Unknown time  . STRATTERA 40 MG capsule Take 40 mg by mouth every morning.  0 Past Week at Unknown time  . traZODone (DESYREL) 100 MG tablet Take 100 mg by mouth at bedtime.  2 Past Week at Unknown time    Review of Systems  Constitutional: Negative for fever.  Gastrointestinal: Positive for abdominal pain, nausea and vomiting. Negative for constipation and diarrhea.  Genitourinary: Negative for dysuria, frequency, urgency, vaginal bleeding and vaginal discharge.   Physical Exam   Blood pressure 114/67, pulse 79, temperature 98.6 F (37 C), temperature source Oral, resp. rate 20, height 5' 3.5" (1.613 m),  weight 107.2 kg, last menstrual period 04/03/2018.  Physical Exam  Nursing note and vitals reviewed. Constitutional: She is oriented to person, place, and time. She appears well-developed and well-nourished. No distress.  HENT:  Head: Normocephalic and atraumatic.  Cardiovascular: Normal rate.  Respiratory: Effort normal.  GI: Soft. She exhibits no distension and no mass. There is no abdominal tenderness. There is no rebound and no guarding.  Neurological: She is alert and oriented to person, place, and time.  Skin: Skin is warm and dry. No erythema.  Psychiatric: She has a normal mood and affect.   Results for orders placed or performed during the hospital encounter of 05/27/18 (from the past 24 hour(s))  Urinalysis, Routine w reflex microscopic     Status: Abnormal   Collection Time: 05/27/18  7:50 PM  Result Value Ref Range   Color, Urine YELLOW YELLOW    APPearance HAZY (A) CLEAR   Specific Gravity, Urine 1.017 1.005 - 1.030   pH 6.0 5.0 - 8.0   Glucose, UA NEGATIVE NEGATIVE mg/dL   Hgb urine dipstick NEGATIVE NEGATIVE   Bilirubin Urine NEGATIVE NEGATIVE   Ketones, ur NEGATIVE NEGATIVE mg/dL   Protein, ur NEGATIVE NEGATIVE mg/dL   Nitrite NEGATIVE NEGATIVE   Leukocytes,Ua NEGATIVE NEGATIVE  Pregnancy, urine POC     Status: Abnormal   Collection Time: 05/27/18  7:51 PM  Result Value Ref Range   Preg Test, Ur POSITIVE (A) NEGATIVE  CBC with Differential/Platelet     Status: Abnormal   Collection Time: 05/27/18  7:55 PM  Result Value Ref Range   WBC 8.2 4.0 - 10.5 K/uL   RBC 4.78 3.87 - 5.11 MIL/uL   Hemoglobin 11.7 (L) 12.0 - 15.0 g/dL   HCT 06.3 01.6 - 01.0 %   MCV 77.0 (L) 80.0 - 100.0 fL   MCH 24.5 (L) 26.0 - 34.0 pg   MCHC 31.8 30.0 - 36.0 g/dL   RDW 93.2 35.5 - 73.2 %   Platelets 350 150 - 400 K/uL   nRBC 0.0 0.0 - 0.2 %   Neutrophils Relative % 51 %   Neutro Abs 4.1 1.7 - 7.7 K/uL   Lymphocytes Relative 43 %   Lymphs Abs 3.6 0.7 - 4.0 K/uL   Monocytes Relative 5 %   Monocytes Absolute 0.4 0.1 - 1.0 K/uL   Eosinophils Relative 1 %   Eosinophils Absolute 0.1 0.0 - 0.5 K/uL   Basophils Relative 0 %   Basophils Absolute 0.0 0.0 - 0.1 K/uL   Immature Granulocytes 0 %   Abs Immature Granulocytes 0.02 0.00 - 0.07 K/uL   US Ob Comp Less 14 Wks  Result Date: 05/27/2018 CLINICAL DATA:  Initial evaluation for acute abdominal pain, early pregnancy. EXAM: OBSTETRIC <14 WK [place] AND TRANSVAGINAL OB US TECHNIQUE: Both transabdominal and transvaginal ultrasound examinations were performed for complete evaluation of the gestation as well as the maternal uterus, adnexal regions, and pelvic cul-de-sac. Transvaginal technique was performed to assess early pregnancy. COMPARISON:  None. FINDINGS: Intrauterine gestational sac: Single Yolk sac:  Present Embryo:  Present Cardiac Activity: Present Heart Rate: 123 bpm CRL: 7.2 mm   6 w   4 d                   [place] EDC: 01/16/2019 Subchorionic hemorrhage:  None visualized. Maternal uterus/adnexae: Ovaries are normal in appearance bilaterally. Small corpus luteal cyst noted on the right. No free fluid within the pelvis. IMPRESSION: 1. Single viable intrauterine pregnancy as above  without complication, estimated gestational age [redacted] weeks and 4 days by crown-rump length, with ultrasound EDC of 01/16/2019. 2. No other acute maternal uterine or adnexal abnormality identified. Electronically Signed   By: Rise Mu M.D.   On: 05/27/2018 21:14   US Ob Transvaginal  Result Date: 05/27/2018 CLINICAL DATA:  Initial evaluation for acute abdominal pain, early pregnancy. EXAM: OBSTETRIC <14 WK [place] AND TRANSVAGINAL OB US TECHNIQUE: Both transabdominal and transvaginal ultrasound examinations were performed for complete evaluation of the gestation as well as the maternal uterus, adnexal regions, and pelvic cul-de-sac. Transvaginal technique was performed to assess early pregnancy. COMPARISON:  None. FINDINGS: Intrauterine gestational sac: Single Yolk sac:  Present Embryo:  Present Cardiac Activity: Present Heart Rate: 123 bpm CRL: 7.2 mm   6 w   4 d                  [place] EDC: 01/16/2019 Subchorionic hemorrhage:  None visualized. Maternal uterus/adnexae: Ovaries are normal in appearance bilaterally. Small corpus luteal cyst noted on the right. No free fluid within the pelvis. IMPRESSION: 1. Single viable intrauterine pregnancy as above without complication, estimated gestational age [redacted] weeks and 4 days by crown-rump length, with ultrasound EDC of 01/16/2019. 2. No other acute maternal uterine or adnexal abnormality identified. Electronically Signed   By: Rise Mu M.D.   On: 05/27/2018 21:14   MAU Course  Procedures None  MDM +UPT UA, wet prep, GC/chlamydia, CBC, ABO/Rh, quant hCG, HIV, RPR and [place] today to rule out ectopic pregnancy Labs pending, waiting for [place]. Care turned over to [facility],  CNM   Vonzella Nipple 05/27/2018, 8:22 PM   Normal IUP on US>[redacted]w[redacted]d with [facility] 01/16/19. GC/wet prep pending. Stable for discharge home.   Assessment and Plan  [redacted]w[redacted]d IUP Abdominal pain in pregnancy  Discharge home Follow up at [facility] in 4 weeks to start care SAB precautions Start PNV   Allergies as of 05/27/2018   No Known Allergies     Medication List    STOP taking these medications   cloNIDine 0.2 MG tablet Commonly known as:  CATAPRES   diphenhydrAMINE 25 MG tablet Commonly known as:  BENADRYL   methocarbamol 500 MG tablet Commonly known as:  ROBAXIN   naproxen 500 MG tablet Commonly known as:  NAPROSYN   Strattera 40 MG capsule Generic drug:  atomoxetine   traZODone 100 MG tablet Commonly known as:  DESYREL     TAKE these medications   famotidine 20 MG tablet Commonly known as:  PEPCID Take 1 tablet (20 mg total) by mouth 2 (two) times daily. Take one tablet twice daily for two days   multivitamin-prenatal 27-0.8 MG Tabs tablet Take 1 tablet by mouth daily at 12 noon.      Donette Larry, CNM  05/27/2018 9:50 PM

## 2018-05-27 NOTE — MAU Note (Addendum)
PT SAYS SHE WANTS TO FIND OUT HOW FAR PREG SHE IS.   YESTERDAY - SHE DID HPT-  2 OF THEM WAS NEG AND 2 WERE POSITIVE .   LAST SEX-   1 MTH AGO. NO BIRTH   CONTROL.   TODAY  AT 630PM-  SHE WAS SITTING - DRINKING WATER- AND SHE FELT  1 SHARP PAIN IN UMBILICUS AND IN HER BACK .  NO PAIN NOW.

## 2018-05-29 LAB — GC/CHLAMYDIA PROBE AMP (~~LOC~~) NOT AT ARMC
CHLAMYDIA, DNA PROBE: NEGATIVE
Neisseria Gonorrhea: NEGATIVE

## 2018-06-02 ENCOUNTER — Inpatient Hospital Stay (HOSPITAL_COMMUNITY): Payer: Medicaid Other

## 2018-06-02 ENCOUNTER — Other Ambulatory Visit: Payer: Self-pay

## 2018-06-02 ENCOUNTER — Inpatient Hospital Stay (HOSPITAL_COMMUNITY)
Admission: AD | Admit: 2018-06-02 | Discharge: 2018-06-02 | Disposition: A | Discharge status: Home / Self Care | Payer: Medicaid Other | Attending: Obstetrics and Gynecology | Admitting: Obstetrics and Gynecology

## 2018-06-02 ENCOUNTER — Encounter (HOSPITAL_COMMUNITY): Payer: Self-pay | Admitting: *Deleted

## 2018-06-02 DIAGNOSIS — Z3A01 Less than 8 weeks gestation of pregnancy: Secondary | ICD-10-CM

## 2018-06-02 DIAGNOSIS — O209 Hemorrhage in early pregnancy, unspecified: Secondary | ICD-10-CM

## 2018-06-02 DIAGNOSIS — O208 Other hemorrhage in early pregnancy: Secondary | ICD-10-CM

## 2018-06-02 DIAGNOSIS — O200 Threatened abortion: Secondary | ICD-10-CM | POA: Diagnosis not present

## 2018-06-02 HISTORY — DX: Depression, unspecified: F32.A

## 2018-06-02 HISTORY — DX: Post-traumatic stress disorder, unspecified: F43.10

## 2018-06-02 HISTORY — DX: Major depressive disorder, single episode, unspecified: F32.9

## 2018-06-02 LAB — CBC
HEMATOCRIT: 37.4 % (ref 36.0–46.0)
Hemoglobin: 11.9 g/dL — ABNORMAL LOW (ref 12.0–15.0)
MCH: 24.6 pg — ABNORMAL LOW (ref 26.0–34.0)
MCHC: 31.8 g/dL (ref 30.0–36.0)
MCV: 77.4 fL — ABNORMAL LOW (ref 80.0–100.0)
Platelets: 408 10*3/uL — ABNORMAL HIGH (ref 150–400)
RBC: 4.83 MIL/uL (ref 3.87–5.11)
RDW: 15.3 % (ref 11.5–15.5)
WBC: 8.6 10*3/uL (ref 4.0–10.5)
nRBC: 0 % (ref 0.0–0.2)

## 2018-06-02 LAB — HCG, QUANTITATIVE, PREGNANCY: hCG, Beta Chain, Quant, S: 74772 m[IU]/mL — ABNORMAL HIGH (ref <5)

## 2018-06-02 MED ORDER — OXYCODONE-ACETAMINOPHEN 5-325 MG PO TABS
1.0000 | ORAL_TABLET | Freq: Once | ORAL | Status: AC
  Administered 2018-06-02: 1 via ORAL
  Filled 2018-06-02: qty 1

## 2018-06-02 MED ORDER — PROMETHAZINE HCL 12.5 MG PO TABS: 12.5000 mg | ORAL_TABLET | Freq: Four times a day (QID) | ORAL | 0 refills | Status: DC | PRN

## 2018-06-02 MED ORDER — OXYCODONE-ACETAMINOPHEN 5-325 MG PO TABS: 1.0000 | ORAL_TABLET | ORAL | 0 refills | Status: DC | PRN

## 2018-06-02 NOTE — Discharge Instructions (Signed)
Your ultrasound did show a live baby in the uterus. However, baby had a very low heart beat of 65 beats per minute and the sac baby is in is very near the cervix, the opening between your uterus and vaginal canal. It is likely that you will have a miscarriage. I have prescribed pain medication and anti-nausea medications for you to take at home as needed. We need to get a repeat pregnancy hormone level to monitor you closely.

## 2018-06-02 NOTE — MAU Note (Addendum)
Pt was hunched over toilet cramping and in tears. Supported pt until cramping subside and she was able to stand. Upon assisting pt with changing pad and underwear, noticed sm amt of tissue on pad which pt desired to put in container and take home to bury. Comfort measure given, wheeled pt and husband out of hospital .  Will follow up at [facility] this thursday for HCG.

## 2018-06-02 NOTE — MAU Provider Note (Signed)
History     CSN: [id number]  Arrival date and time: 06/02/18 1717   First Provider Initiated Contact with Patient 06/02/18 1829      Chief Complaint  Patient presents with  . Vaginal Bleeding  . Abdominal Pain   HPI   Bonnie Cobb is 23 y.o. G1P0000 female at [redacted]w[redacted]d who presents for vaginal bleeding. Bleeding started this AM. Has been heavy changing pads q2 hours. While attempting to give a urine sample in MAU, she passed a blood clot. Otherwise no clots or tissue noted. Endorses associated lower abdominal cramping. Has a confirmed IUP from ultrasound on 3/17.   OB History    Gravida  1   Para      Term      Preterm      AB  0   Living        SAB  0   TAB      Ectopic      Multiple      Live Births              Past Medical History:  Diagnosis Date  . ADHD (attention deficit hyperactivity disorder)   . Depression   . PTSD (post-traumatic stress disorder)     Past Surgical History:  Procedure Laterality Date  . NO PAST SURGERIES      Family History  Problem Relation Age of Onset  . Healthy Mother   . Healthy Father     Social History   Tobacco Use  . Smoking status: Former Smoker    Types: Cigars  . Smokeless tobacco: Never Used  Substance Use Topics  . Alcohol use: No  . Drug use: Yes    Types: Marijuana    Comment: Last smoked 05/30/2018    Allergies: No Known Allergies  Medications Prior to Admission  Medication Sig Dispense Refill Last Dose  . famotidine (PEPCID) 20 MG tablet Take 1 tablet (20 mg total) by mouth 2 (two) times daily. Take one tablet twice daily for two days (Patient not taking: Reported on 03/11/2018) 30 tablet 0 Not Taking at Unknown time  . Prenatal Vit-Fe Fumarate-FA (MULTIVITAMIN-PRENATAL) 27-0.8 MG TABS tablet Take 1 tablet by mouth daily at 12 noon.   Past Month at Unknown time    Review of Systems  Constitutional: Negative for chills and fever.  Respiratory: Negative for cough and shortness of breath.    Cardiovascular: Negative for chest pain.  Gastrointestinal: Positive for abdominal pain. Negative for nausea and vomiting.  Genitourinary: Positive for vaginal bleeding. Negative for dysuria, pelvic pain and vaginal discharge.  Skin: Negative for rash.  Neurological: Negative for dizziness and light-headedness.   Physical Exam   Blood pressure 118/62, pulse 69, temperature 98.4 F (36.9 C), temperature source Oral, resp. rate 18, height 5' 3.5" (1.613 m), weight 106.6 kg, last menstrual period 04/03/2018, SpO2 100 %.  Physical Exam  Constitutional: She is oriented to person, place, and time. She appears well-developed and well-nourished.  In pain but non-toxic appearing.   HENT:  Head: Normocephalic and atraumatic.  Eyes: Conjunctivae and EOM are normal.  Neck: Normal range of motion. Neck supple.  Cardiovascular: Normal rate, regular rhythm and normal heart sounds.  Respiratory: Effort normal and breath sounds normal. No respiratory distress.  GI: Soft. She exhibits no distension. There is no rebound and no guarding.  TTP in the lower quadrants bilaterally.   Genitourinary:    Genitourinary Comments: Bleeding noted from vagina.    Musculoskeletal: Normal range  of motion.        General: No edema.  Neurological: She is alert and oriented to person, place, and time. She exhibits normal muscle tone.  Skin: Skin is warm and dry.  Psychiatric: She has a normal mood and affect. Her behavior is normal.    MAU Course  Procedures  MDM Repeat ultrasound obtained. Blood type A+, Rhogam not indicated. HgB stable. Bhcg obtained.   US Ob Transvaginal  Result Date: 06/02/2018 CLINICAL DATA:  Pregnant patient in first-trimester pregnancy with vaginal bleeding. EXAM: TRANSVAGINAL OB ULTRASOUND TECHNIQUE: Transvaginal ultrasound was performed for complete evaluation of the gestation as well as the maternal uterus, adnexal regions, and pelvic cul-de-sac. COMPARISON:  Obstetric ultrasound  05/27/2018 FINDINGS: Intrauterine gestational sac: Single. Gestational sac is elongated and located in the lower uterine segment approaching the internal os. Yolk sac:  Visualized. Embryo:  Visualized. Cardiac Activity: Visualized. Heart Rate: 65 bpm CRL:   11.9 mm   7 w 2 d                  [place] EDC: 01/17/2019 Subchorionic hemorrhage:  None visualized. Maternal uterus/adnexae: Both ovaries are visualized and appear normal. No extra ovarian adnexal mass. Small volume of simple free fluid in the pelvis. IMPRESSION: 1. Live intrauterine gestation, however the gestational sac is elongated and located in the lower uterine segment approaching the internal os. Fetal bradycardia with fetal heart rate of 65 beats per minute. Findings can be seen with impending pregnancy loss, and recommend close clinical and sonographic follow-up. 2. Interval fetal growth since prior exam. Current estimated gestational age [redacted] weeks 2 days. Electronically Signed   By: Narda Rutherford M.D.   On: 06/02/2018 19:45      Assessment and Plan   1. Threatened abortion   2. Vaginal bleeding affecting early pregnancy    Discussed with patient that ultrasound findings of fetal bradycardia with elongated gestational sac in the lower uterine segment approaching internal os with symptoms of vaginal bleeding and abdominal cramping is concerning for threatened SAB. Discussed that I anticipate a miscarriage. Provided emotional support. Discharged with Percocet and Phenergan. Repeat B-hcg scheduled for 3/26 at [facility]. Patient stable for discharge. Strict return precautions discussed.   De Hollingshead 06/02/2018, 6:34 PM

## 2018-06-02 NOTE — MAU Note (Signed)
Started bleeding this morning, getting heavier, has not passed any clots, bled through pad. Start cramping about an hour ago

## 2018-06-03 LAB — ABO/RH: ABO/RH(D): A POS

## 2018-06-05 ENCOUNTER — Other Ambulatory Visit: Payer: Medicaid Other

## 2018-06-05 ENCOUNTER — Other Ambulatory Visit: Payer: Self-pay | Admitting: *Deleted

## 2018-06-05 DIAGNOSIS — O200 Threatened abortion: Secondary | ICD-10-CM

## 2018-07-14 ENCOUNTER — Inpatient Hospital Stay (HOSPITAL_COMMUNITY)
Admission: AD | Admit: 2018-07-14 | Discharge: 2018-07-14 | Discharge status: Against Medical Advice | Payer: Medicaid Other | Attending: Obstetrics & Gynecology | Admitting: Obstetrics & Gynecology

## 2018-07-14 ENCOUNTER — Ambulatory Visit (HOSPITAL_COMMUNITY)
Admission: EM | Admit: 2018-07-14 | Discharge: 2018-07-14 | Disposition: A | Discharge status: Home / Self Care | Payer: Medicaid Other | Attending: Family Medicine | Admitting: Family Medicine

## 2018-07-14 ENCOUNTER — Encounter (HOSPITAL_COMMUNITY): Payer: Self-pay | Admitting: Emergency Medicine

## 2018-07-14 ENCOUNTER — Other Ambulatory Visit: Payer: Self-pay

## 2018-07-14 DIAGNOSIS — N939 Abnormal uterine and vaginal bleeding, unspecified: Secondary | ICD-10-CM | POA: Diagnosis not present

## 2018-07-14 DIAGNOSIS — Z5321 Procedure and treatment not carried out due to patient leaving prior to being seen by health care provider: Secondary | ICD-10-CM | POA: Insufficient documentation

## 2018-07-14 LAB — POCT PREGNANCY, URINE: Preg Test, Ur: NEGATIVE

## 2018-07-14 NOTE — ED Provider Notes (Signed)
[facility]    CSN: [id number] Arrival date & time: 07/14/18  1405     History   Chief Complaint Chief Complaint  Patient presents with  . Vaginal Bleeding    HPI HIBO FETTE is a 23 y.o. female.   HPI  Patient had a miscarriage in March/20/2020.  She has not any vaginal bleeding since that time.  She has been having unprotected sexual relations.  She really hopes to be pregnant.  She started having some spotting 5 days ago.  It has been irregular.  No nausea.  No breast tenderness.  She has an appointment with her GYN tomorrow.  She is here today because she really wants to know if she is pregnant.  Her pregnancy test is negative  Past Medical History:  Diagnosis Date  . ADHD (attention deficit hyperactivity disorder)   . Depression   . PTSD (post-traumatic stress disorder)     There are no active problems to display for this patient.   Past Surgical History:  Procedure Laterality Date  . NO PAST SURGERIES      OB History    Gravida  1   Para      Term      Preterm      AB  0   Living        SAB  0   TAB      Ectopic      Multiple      Live Births               Home Medications    Prior to Admission medications   Medication Sig Start Date End Date Taking? Authorizing Provider  Prenatal Vit-Fe Fumarate-FA (MULTIVITAMIN-PRENATAL) 27-0.8 MG TABS tablet Take 1 tablet by mouth daily at 12 noon.    [provider]    Family History Family History  Problem Relation Age of Onset  . Healthy Mother   . Healthy Father     Social History Social History   Tobacco Use  . Smoking status: Former Smoker    Types: Cigars  . Smokeless tobacco: Never Used  Substance Use Topics  . Alcohol use: No  . Drug use: Yes    Types: Marijuana    Comment: Last smoked 05/30/2018     Allergies   Patient has no known allergies.   Review of Systems Review of Systems  Constitutional: Negative for chills and fever.  HENT:  Negative for ear pain and sore throat.   Eyes: Negative for pain and visual disturbance.  Respiratory: Negative for cough and shortness of breath.   Cardiovascular: Negative for chest pain and palpitations.  Gastrointestinal: Negative for abdominal pain and vomiting.  Genitourinary: Positive for menstrual problem. Negative for dysuria and hematuria.  Musculoskeletal: Negative for arthralgias and back pain.  Skin: Negative for color change and rash.  Neurological: Negative for seizures and syncope.  All other systems reviewed and are negative.    Physical Exam Triage Vital Signs ED Triage Vitals  Enc Vitals Group     BP 07/14/18 1426 (!) 153/97     Pulse Rate 07/14/18 1426 95     Resp 07/14/18 1426 18     Temp 07/14/18 1426 98.8 F (37.1 C)     Temp src --      SpO2 07/14/18 1426 100 %     Weight --      Height --      Head Circumference --  Peak Flow --      Pain Score 07/14/18 1427 0     Pain Loc --      Pain Edu? --      Excl. in GC? --    No data found.  Updated Vital Signs BP (!) 153/97   Pulse 95   Temp 98.8 F (37.1 C)   Resp 18   LMP 07/14/2018   SpO2 100%   Breastfeeding Unknown          Physical Exam Constitutional:      General: She is not in acute distress.    Appearance: She is well-developed.  HENT:     Head: Normocephalic and atraumatic.  Eyes:     Conjunctiva/sclera: Conjunctivae normal.     Pupils: Pupils are equal, round, and reactive to light.  Neck:     Musculoskeletal: Normal range of motion.  Cardiovascular:     Rate and Rhythm: Normal rate.  Pulmonary:     Effort: Pulmonary effort is normal. No respiratory distress.  Abdominal:     General: There is no distension.     Palpations: Abdomen is soft.  Musculoskeletal: Normal range of motion.  Skin:    General: Skin is warm and dry.  Neurological:     Mental Status: She is alert.      UC Treatments / Results  Labs (all labs ordered are listed, but only abnormal results  are displayed) Labs Reviewed  POC URINE PREG, ED  POCT PREGNANCY, URINE    EKG None  Radiology No results found.  Procedures Procedures (including critical care time)  Medications Ordered in UC Medications - No data to display  Initial Impression / Assessment and Plan / UC Course  I have reviewed the triage vital signs and the nursing notes.  Pertinent labs & imaging results that were available during my care of the patient were reviewed by me and considered in my medical decision making (see chart for details).      Final Clinical Impressions(s) / UC Diagnoses   Final diagnoses:  Vaginal bleeding     Discharge Instructions     Negative pregnancy See your GYN tomorrow   ED Prescriptions    None     Controlled Substance Prescriptions [place] Controlled Substance Registry consulted? Not Applicable   Eustace MooreNelson, Alley Neils Sue, MD 07/14/18 2010

## 2018-07-14 NOTE — MAU Note (Signed)
Pt said she couldn't wait and was going to leave.

## 2018-07-14 NOTE — Discharge Instructions (Addendum)
Negative pregnancy See your GYN tomorrow

## 2018-07-14 NOTE — MAU Note (Signed)
Pt had spotting starting on 4/30, not currently spotting, stopped on 5/2. States she had a miscarriage on 3/23, never followed up for bloodwork. Had positive test today at 230pm, but was negative, so she came here because she didn't trust it.   Has abdominal cramping on left side, rates a 5.

## 2018-07-14 NOTE — MAU Note (Signed)
Phlebotomy called to draw labs for patient. Patient not in lobby.

## 2018-07-14 NOTE — ED Triage Notes (Signed)
Pt states she had a miscarriage in march, hasn't had a period since then, states she started having spotting 5 days ago, was concerned about possible pregnancy.

## 2018-07-14 NOTE — MAU Note (Signed)
Not in lobby

## 2018-08-04 ENCOUNTER — Other Ambulatory Visit: Payer: Self-pay

## 2018-08-04 ENCOUNTER — Ambulatory Visit (HOSPITAL_COMMUNITY)
Admission: EM | Admit: 2018-08-04 | Discharge: 2018-08-04 | Disposition: A | Discharge status: Home / Self Care | Payer: Medicaid Other | Attending: Family Medicine | Admitting: Family Medicine

## 2018-08-04 ENCOUNTER — Encounter (HOSPITAL_COMMUNITY): Payer: Self-pay | Admitting: Emergency Medicine

## 2018-08-04 ENCOUNTER — Telehealth (HOSPITAL_COMMUNITY): Payer: Self-pay | Admitting: Emergency Medicine

## 2018-08-04 DIAGNOSIS — J069 Acute upper respiratory infection, unspecified: Secondary | ICD-10-CM

## 2018-08-04 DIAGNOSIS — B9789 Other viral agents as the cause of diseases classified elsewhere: Secondary | ICD-10-CM | POA: Diagnosis not present

## 2018-08-04 MED ORDER — IPRATROPIUM BROMIDE 0.06 % NA SOLN: 2.0000 | Freq: Four times a day (QID) | NASAL | 12 refills | Status: DC

## 2018-08-04 MED ORDER — IBUPROFEN 800 MG PO TABS: 800.0000 mg | ORAL_TABLET | Freq: Three times a day (TID) | ORAL | 0 refills | Status: DC

## 2018-08-04 MED ORDER — BENZONATATE 100 MG PO CAPS: 100.0000 mg | ORAL_CAPSULE | Freq: Three times a day (TID) | ORAL | 0 refills | Status: DC

## 2018-08-04 NOTE — ED Provider Notes (Signed)
[facility]    CSN: [id number] Arrival date & time: 08/04/18  1241     History   Chief Complaint Chief Complaint  Patient presents with  . Sore Throat    HPI Bonnie Cobb is a 23 y.o. female.   [name] presents with complaints of sore throat. Symptoms originally started 3 days ago with sore throat, vomiting, hot/cold, cough. Called her PCP and was tested for Covid-19, tested negative. Gi symptoms have improved but now primarily with sore throat. Still with some cough. No known fevers. Head pressure with coughing. Has been taking a mucinex combination medication as well as tylenol which helps some. Gargling did not help sore throat. No known ill contacts. Has been at home, not currently working or attending school. Cough is productive of yellow mucus. Throat is painful with swallowing. Hx of adhd, ptsd and depression.     ROS per HPI, negative if not otherwise mentioned.      Past Medical History:  Diagnosis Date  . ADHD (attention deficit hyperactivity disorder)   . Depression   . PTSD (post-traumatic stress disorder)     There are no active problems to display for this patient.   Past Surgical History:  Procedure Laterality Date  . NO PAST SURGERIES      OB History    Gravida  1   Para      Term      Preterm      AB  0   Living        SAB  0   TAB      Ectopic      Multiple      Live Births               Home Medications    Prior to Admission medications   Medication Sig Start Date End Date Taking? Authorizing Provider  benzonatate (TESSALON) 100 MG capsule Take 1 capsule (100 mg total) by mouth every 8 (eight) hours. 08/04/18   Georgetta Haber, NP  ibuprofen (ADVIL) 800 MG tablet Take 1 tablet (800 mg total) by mouth 3 (three) times daily. 08/04/18   Linus Mako B, NP  ipratropium (ATROVENT) 0.06 % nasal spray Place 2 sprays into both nostrils 4 (four) times daily. 08/04/18   Georgetta Haber, NP  Prenatal  Vit-Fe Fumarate-FA (MULTIVITAMIN-PRENATAL) 27-0.8 MG TABS tablet Take 1 tablet by mouth daily at 12 noon.    [provider]    Family History Family History  Problem Relation Age of Onset  . Healthy Mother   . Healthy Father     Social History Social History   Tobacco Use  . Smoking status: Former Smoker    Types: Cigars  . Smokeless tobacco: Never Used  Substance Use Topics  . Alcohol use: No  . Drug use: Yes    Types: Marijuana    Comment: Last smoked 05/30/2018     Allergies   Patient has no known allergies.   Review of Systems Review of Systems   Physical Exam Triage Vital Signs ED Triage Vitals  Enc Vitals Group     BP 08/04/18 1252 111/73     Pulse Rate 08/04/18 1252 99     Resp 08/04/18 1252 20     Temp 08/04/18 1252 98.5 F (36.9 C)     Temp Source 08/04/18 1252 Oral     SpO2 08/04/18 1252 100 %     Weight --  Height --      Head Circumference --      Peak Flow --      Pain Score 08/04/18 1253 5     Pain Loc --      Pain Edu? --      Excl. in GC? --    No data found.  Updated Vital Signs BP 111/73 (BP Location: Right Arm)   Pulse 99   Temp 98.5 F (36.9 C) (Oral)   Resp 20   LMP 07/14/2018   SpO2 100%    Physical Exam Constitutional:      General: She is not in acute distress.    Appearance: She is well-developed.  HENT:     Right Ear: Tympanic membrane and ear canal normal.     Left Ear: Tympanic membrane and ear canal normal.     Mouth/Throat:     Mouth: Mucous membranes are moist.     Pharynx: No oropharyngeal exudate or uvula swelling.     Tonsils: No tonsillar exudate. 1+ on the right. 1+ on the left.  Cardiovascular:     Rate and Rhythm: Normal rate and regular rhythm.     Heart sounds: Normal heart sounds.  Pulmonary:     Effort: Pulmonary effort is normal.     Comments: Occasional congested cough noted  Skin:    General: Skin is warm and dry.  Neurological:     Mental Status: She is alert and oriented to  person, place, and time.      UC Treatments / Results  Labs (all labs ordered are listed, but only abnormal results are displayed) Labs Reviewed - No data to display  EKG None  Radiology No results found.  Procedures Procedures (including critical care time)  Medications Ordered in UC Medications - No data to display  Initial Impression / Assessment and Plan / UC Course  I have reviewed the triage vital signs and the nursing notes.  Pertinent labs & imaging results that were available during my care of the patient were reviewed by me and considered in my medical decision making (see chart for details).     Non toxic. Benign physical exam.  History and physical consistent with viral illness.  Negative covid testing, per patient. No known exposures. Supportive cares recommended. Patient concerned about needing antibiotics and possible sinus infection. Day 4 of illness, no fevers with benign exam. Deferred antibiotics at this time with return precautions and follow up if symptoms persist. Patient verbalized understanding and agreeable to plan.   Final Clinical Impressions(s) / UC Diagnoses   Final diagnoses:  Viral URI with cough     Discharge Instructions     Push fluids to ensure adequate hydration and keep secretions thin.  Tylenol and/or ibuprofen as needed for pain or fevers.  Tessalon as needed to help with cough.  Continue with combination mucinex over the counter treatments as needed for symptoms.  Nasal spray 2-4 times a day to help with post nasal drip.  Throat lozenges, gargles, chloraseptic spray, warm teas, popsicles etc to help with throat pain.   If symptoms worsen or do not improve in the next week to return to be seen or to follow up with your PCP.     ED Prescriptions    Medication Sig Dispense Auth. Provider   benzonatate (TESSALON) 100 MG capsule Take 1 capsule (100 mg total) by mouth every 8 (eight) hours. 21 capsule Linus Mako B, NP    ibuprofen (ADVIL) 800 MG tablet  Take 1 tablet (800 mg total) by mouth 3 (three) times daily. 21 tablet Linus MakoBurky, Jerrico Covello B, NP   ipratropium (ATROVENT) 0.06 % nasal spray Place 2 sprays into both nostrils 4 (four) times daily. 15 mL Linus MakoBurky, Vincenza Dail B, NP     Controlled Substance Prescriptions [place] Controlled Substance Registry consulted? Not Applicable   Georgetta HaberBurky, Kennisha Qin B, NP 08/04/18 1334

## 2018-08-04 NOTE — ED Triage Notes (Signed)
Pt here for sore throat x 3 days  

## 2018-08-04 NOTE — Discharge Instructions (Signed)
Push fluids to ensure adequate hydration and keep secretions thin.  Tylenol and/or ibuprofen as needed for pain or fevers.  Tessalon as needed to help with cough.  Continue with combination mucinex over the counter treatments as needed for symptoms.  Nasal spray 2-4 times a day to help with post nasal drip.  Throat lozenges, gargles, chloraseptic spray, warm teas, popsicles etc to help with throat pain.   If symptoms worsen or do not improve in the next week to return to be seen or to follow up with your PCP.

## 2018-08-04 NOTE — Telephone Encounter (Signed)
Pt called and stated that her meds were not covered by her insurance; per NB there were no alternative prescriptions but could use over the counter meds; pt verbalized understanding

## 2018-11-22 ENCOUNTER — Inpatient Hospital Stay (HOSPITAL_COMMUNITY)
Admission: AD | Admit: 2018-11-22 | Discharge: 2018-11-22 | Disposition: A | Discharge status: Home / Self Care | Payer: Medicaid Other | Attending: Obstetrics and Gynecology | Admitting: Obstetrics and Gynecology

## 2018-11-22 ENCOUNTER — Encounter (HOSPITAL_COMMUNITY): Payer: Self-pay

## 2018-11-22 ENCOUNTER — Other Ambulatory Visit: Payer: Self-pay

## 2018-11-22 DIAGNOSIS — Z59819 Housing instability, housed unspecified: Secondary | ICD-10-CM

## 2018-11-22 DIAGNOSIS — Z3A01 Less than 8 weeks gestation of pregnancy: Secondary | ICD-10-CM | POA: Insufficient documentation

## 2018-11-22 DIAGNOSIS — Z3201 Encounter for pregnancy test, result positive: Secondary | ICD-10-CM

## 2018-11-22 DIAGNOSIS — O26891 Other specified pregnancy related conditions, first trimester: Secondary | ICD-10-CM | POA: Insufficient documentation

## 2018-11-22 DIAGNOSIS — Z599 Problem related to housing and economic circumstances, unspecified: Secondary | ICD-10-CM

## 2018-11-22 LAB — POCT PREGNANCY, URINE: Preg Test, Ur: POSITIVE — AB

## 2018-11-22 NOTE — Progress Notes (Signed)
CSW received call from RN requesting that a CSW come speak with patient due to housing concerns. CSW met with patient to answer questions and offer resources. Per patient, she has been "plopping around" to different places for shelter. Patient stated she is currently staying with a friend in [place] but reported it was a "full house" as her friend's mom and her friend's 2 kids also live there. [name] shared that she hasn't had stable housing since 2018 when she was living with her grandma. [name] reported it hasn't worked out living with her family members and requested information for housing resources. CSW provided [name] with contact information for [employer] and [employer] to reach out to gather more information regarding available options. CSW cautioned [name] that [employer] may not be taking applications right now but to reach out and see. 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 CSW provided patient with a list of agencies that may be able to assist. [name] also shared she is hoping to get her disability turned back on and will be following up on that also. Patient reported she has a safe place to stay tonight whether it be with her grandmother or her mother and then she intends to return to [facility] on Monday. Patient confirmed she has access to transportation. CSW also provided patient with information for the Pregnancy [place]. Patient denied any further questions, concerns or need for resources from CSW, at this time.   Elijio Miles, [place]  [facility] [employer] [phone]

## 2018-11-22 NOTE — MAU Note (Signed)
List of [place] area providers and safe medications in pregnancy list given to patient.

## 2018-11-22 NOTE — MAU Note (Signed)
Bonnie Cobb is a 23 y.o. at [redacted]w[redacted]d here in MAU reporting: states she is not having any pain or bleeding. States she came up here to get information about the housing authority and if they have emergency housing. States she has been staying with a friend in [place] but states it hasn't been working out. Also wanting to know how far along she is.   LMP: 10/27/18  Pain score: 0/10  Vitals:   11/22/18 1437  BP: 116/84  Pulse: 72  Resp: 16  Temp: 98.3 F (36.8 C)  SpO2: 100%      Lab orders placed from triage: UPT

## 2018-11-22 NOTE — MAU Note (Signed)
CSW called, states she will come see patient.

## 2018-11-22 NOTE — Discharge Instructions (Signed)
[facility]     Phone: [phone]  [facility] for [employer] at [place]  Phone: [phone]  [facility] for [place] at [place]  Phone: [place] [facility] [place] at [place]                           Phone: [place] for [place] at [facility]          Phone: [phone]  [facility]    Phone: [place])    Phone: [facility]    Phone: [phone]  [facility]    Phone: [place]    Phone: [phone]  [place]  Phone: [place]               Phone: [phone]  [facility]   Phone: [phone]  [facility]    Phone: [phone]                                  Safe Medications in Pregnancy    Acne: Benzoyl Peroxide Salicylic Acid  Backache/Headache: Tylenol: 2 regular strength every 4 hours OR              2 Extra strength every 6 hours  Colds/Coughs/Allergies: Benadryl (alcohol free) 25 mg every 6 hours as needed Breath right strips Claritin Cepacol throat lozenges Chloraseptic throat spray Cold-Eeze- up to three times per day Cough drops, alcohol free Flonase (by prescription only) Guaifenesin Mucinex Robitussin DM (plain only, alcohol free) Saline nasal spray/drops Sudafed (pseudoephedrine) & Actifed ** use only after [redacted] weeks gestation and if you do not have high blood pressure Tylenol Vicks Vaporub Zinc lozenges Zyrtec   Constipation: Colace Ducolax suppositories Fleet enema Glycerin suppositories Metamucil Milk of magnesia Miralax Senokot Smooth move tea  Diarrhea: Kaopectate Imodium A-D  *NO pepto Bismol  Hemorrhoids: Anusol Anusol  HC Preparation H Tucks  Indigestion: Tums Maalox Mylanta Zantac  Pepcid  Insomnia: Benadryl (alcohol free) 25mg  every 6 hours as needed Tylenol PM Unisom, no Gelcaps  Leg Cramps: Tums MagGel  Nausea/Vomiting:  Bonine Dramamine Emetrol Ginger extract Sea bands Meclizine  Nausea medication to take during pregnancy:  Unisom (doxylamine succinate 25 mg tablets) Take one tablet daily at bedtime. If symptoms are not adequately controlled, the dose can be increased to a maximum recommended dose of two tablets daily (1/2 tablet in the morning, 1/2 tablet mid-afternoon and one at bedtime). Vitamin B6 100mg  tablets. Take one tablet twice a day (up to 200 mg per day).  Skin Rashes: Aveeno products Benadryl cream or 25mg  every 6 hours as needed Calamine Lotion 1% cortisone cream  Yeast infection: Gyne-lotrimin 7 Monistat 7   **If taking multiple medications, please check labels to avoid duplicating the same active ingredients **take medication as directed on the label ** Do not exceed 4000 mg of tylenol in 24 hours **Do not take medications that contain aspirin or ibuprofen

## 2018-11-22 NOTE — MAU Provider Note (Signed)
First Provider Initiated Contact with Patient 11/22/18 1441      S [name] is a 23 y.o. G2P0000 pregnant female @[redacted]w[redacted]d  by LMP who presents to MAU today with complaint of "wanting to know how far along I am" and housing instability. Pt denies pain, bleeding, N/V.  O BP 116/84 (BP Location: Right Arm)   Pulse 72   Temp 98.3 F (36.8 C) (Oral)   Resp 16   Ht 5' 3.5" (1.613 m)   Wt 107.9 kg   LMP 10/27/2018   SpO2 100%   BMI 41.46 kg/m  Physical Exam  Constitutional: She is oriented to person, place, and time. She appears well-developed and well-nourished. No distress.  HENT:  Head: Normocephalic and atraumatic.  Respiratory: Effort normal.  Neurological: She is alert and oriented to person, place, and time.  Skin: She is not diaphoretic.  Psychiatric: She has a normal mood and affect. Her behavior is normal. Judgment and thought content normal.   A Pregnant female Medical screening exam complete Housing instability  P Social work consult to discuss housing options Discharge from MAU in stable condition List of OB providers in [place] given List of safe meds in pregnancy given  Warning signs for worsening condition that would warrant emergency follow-up discussed Patient may return to MAU as needed for pregnancy related complaints  Dejana Pugsley, Gerrie Nordmann, NP 11/22/2018 2:48 PM

## 2018-12-15 ENCOUNTER — Inpatient Hospital Stay (HOSPITAL_COMMUNITY)
Admission: AC | Admit: 2018-12-15 | Discharge: 2018-12-15 | Disposition: A | Discharge status: Home / Self Care | Payer: Medicaid Other | Attending: Obstetrics and Gynecology | Admitting: Obstetrics and Gynecology

## 2018-12-15 ENCOUNTER — Other Ambulatory Visit: Payer: Self-pay

## 2018-12-15 DIAGNOSIS — N925 Other specified irregular menstruation: Secondary | ICD-10-CM

## 2018-12-15 DIAGNOSIS — N912 Amenorrhea, unspecified: Secondary | ICD-10-CM | POA: Diagnosis present

## 2018-12-15 DIAGNOSIS — Z3201 Encounter for pregnancy test, result positive: Secondary | ICD-10-CM

## 2018-12-15 LAB — URINALYSIS, ROUTINE W REFLEX MICROSCOPIC
Bilirubin Urine: NEGATIVE
Glucose, UA: NEGATIVE mg/dL
Hgb urine dipstick: NEGATIVE
Ketones, ur: NEGATIVE mg/dL
Leukocytes,Ua: NEGATIVE
Nitrite: NEGATIVE
Protein, ur: NEGATIVE mg/dL
Specific Gravity, Urine: 1.021 (ref 1.005–1.030)
pH: 7 (ref 5.0–8.0)

## 2018-12-15 NOTE — MAU Note (Signed)
.   Bonnie Cobb is a 23 y.o. at [redacted]w[redacted]d here in MAU reporting: that she needs confirmation of pregnancy. Denies any VB or pain LMP: 10/27/18 Onset of complaint: none Pain score:  Vitals:   12/15/18 1448 12/15/18 1449  BP:  120/74  Pulse: 98   Resp: 16   Temp: 98.1 F (36.7 C)      FHT: Lab orders placed from triage:

## 2018-12-15 NOTE — MAU Provider Note (Signed)
  S:   23 y.o. G2P0000 @[redacted]w[redacted]d  by LMP presents to MAU for pregnancy confirmation.  She denies abdominal pain or vaginal bleeding today.  She continues to have housing instability, however has a plan in place. She is requesting a pregnancy verification.   O: BP 120/74   Pulse 98   Temp 98.1 F (36.7 C)   Resp 16   Wt 106.6 kg   LMP 10/27/2018   BMI 40.98 kg/m  Physical Examination: General appearance - alert, well appearing, and in no distress, oriented to person, place, and time and acyanotic, in no respiratory distress  Results for orders placed or performed during the hospital encounter of 12/15/18 (from the past 48 hour(s))  Urinalysis, Routine w reflex microscopic     Status: Abnormal   Collection Time: 12/15/18  2:15 PM  Result Value Ref Range   Color, Urine YELLOW YELLOW   APPearance HAZY (A) CLEAR   Specific Gravity, Urine 1.021 1.005 - 1.030   pH 7.0 5.0 - 8.0   Glucose, UA NEGATIVE NEGATIVE mg/dL   Hgb urine dipstick NEGATIVE NEGATIVE   Bilirubin Urine NEGATIVE NEGATIVE   Ketones, ur NEGATIVE NEGATIVE mg/dL   Protein, ur NEGATIVE NEGATIVE mg/dL   Nitrite NEGATIVE NEGATIVE   Leukocytes,Ua NEGATIVE NEGATIVE    Comment: Performed at [facility], [address], [place], [place] [address]    A: Missed menses + Pregnancy test from 9/12   P:  D/C home Referred to [facility] and surrounding offices.  Return to MAU as needed for pregnancy related emergencies  Noni Saupe, NP 2:58 PM

## 2019-01-05 ENCOUNTER — Inpatient Hospital Stay (HOSPITAL_COMMUNITY): Payer: Medicaid Other

## 2019-01-05 ENCOUNTER — Encounter (HOSPITAL_COMMUNITY): Payer: Self-pay

## 2019-01-05 ENCOUNTER — Other Ambulatory Visit: Payer: Self-pay

## 2019-01-05 ENCOUNTER — Inpatient Hospital Stay (HOSPITAL_COMMUNITY)
Admission: AD | Admit: 2019-01-05 | Discharge: 2019-01-05 | Disposition: A | Discharge status: Home / Self Care | Payer: Medicaid Other | Attending: Obstetrics and Gynecology | Admitting: Obstetrics and Gynecology

## 2019-01-05 DIAGNOSIS — O161 Unspecified maternal hypertension, first trimester: Secondary | ICD-10-CM | POA: Diagnosis not present

## 2019-01-05 DIAGNOSIS — O219 Vomiting of pregnancy, unspecified: Secondary | ICD-10-CM | POA: Diagnosis not present

## 2019-01-05 DIAGNOSIS — R030 Elevated blood-pressure reading, without diagnosis of hypertension: Secondary | ICD-10-CM | POA: Diagnosis not present

## 2019-01-05 DIAGNOSIS — Z87891 Personal history of nicotine dependence: Secondary | ICD-10-CM | POA: Diagnosis not present

## 2019-01-05 DIAGNOSIS — R112 Nausea with vomiting, unspecified: Secondary | ICD-10-CM | POA: Diagnosis present

## 2019-01-05 DIAGNOSIS — O26899 Other specified pregnancy related conditions, unspecified trimester: Secondary | ICD-10-CM

## 2019-01-05 DIAGNOSIS — R109 Unspecified abdominal pain: Secondary | ICD-10-CM

## 2019-01-05 DIAGNOSIS — Z3A10 10 weeks gestation of pregnancy: Secondary | ICD-10-CM | POA: Diagnosis not present

## 2019-01-05 DIAGNOSIS — O26891 Other specified pregnancy related conditions, first trimester: Secondary | ICD-10-CM | POA: Insufficient documentation

## 2019-01-05 LAB — URINALYSIS, ROUTINE W REFLEX MICROSCOPIC
Bilirubin Urine: NEGATIVE
Glucose, UA: NEGATIVE mg/dL
Hgb urine dipstick: NEGATIVE
Ketones, ur: 15 mg/dL — AB
Leukocytes,Ua: NEGATIVE
Nitrite: NEGATIVE
Protein, ur: NEGATIVE mg/dL
Specific Gravity, Urine: >1.03 — ABNORMAL HIGH (ref 1.005–1.030)
pH: 6 (ref 5.0–8.0)

## 2019-01-05 MED ORDER — PROMETHAZINE HCL 12.5 MG PO TABS: 12.5000 mg | ORAL_TABLET | Freq: Four times a day (QID) | ORAL | 0 refills | Status: DC | PRN

## 2019-01-05 MED ORDER — PROMETHAZINE HCL 25 MG/ML IJ SOLN
12.5000 mg | Freq: Once | INTRAMUSCULAR | Status: AC
  Administered 2019-01-05: 20:00:00 12.5 mg via INTRAMUSCULAR
  Filled 2019-01-05: qty 1

## 2019-01-05 MED ORDER — DOXYLAMINE-PYRIDOXINE 10-10 MG PO TBEC: 2.0000 | DELAYED_RELEASE_TABLET | Freq: Every evening | ORAL | 0 refills | Status: DC | PRN

## 2019-01-05 NOTE — Discharge Instructions (Signed)
Nausea & Vomiting  Have saltine crackers or pretzels by your bed and eat a few bites before you raise your head out of bed in the morning  Eat small frequent meals throughout the day instead of large meals  Drink plenty of fluids throughout the day to stay hydrated, just don't drink a lot of fluids with your meals.  This can make your stomach fill up faster making you feel sick  Do not brush your teeth right after you eat  Products with real ginger are good for nausea, like ginger ale and ginger hard candy Make sure it says made with real ginger!  Sucking on sour candy like lemon heads is also good for nausea  If your prenatal vitamins make you nauseated, take them at night so you will sleep through the nausea  Sea Bands  If you feel like you need medicine for the nausea & vomiting please let us know  If you are unable to keep any fluids or food down please let us know    Morning Sickness  Morning sickness is when you feel sick to your stomach (nauseous) during pregnancy. You may feel sick to your stomach and throw up (vomit). You may feel sick in the morning, but you can feel this way at any time of day. Some women feel very sick to their stomach and cannot stop throwing up (hyperemesis gravidarum). Follow these instructions at home: Medicines  Take over-the-counter and prescription medicines only as told by your doctor. Do not take any medicines until you talk with your doctor about them first.  Taking multivitamins before getting pregnant can stop or lessen the harshness of morning sickness. Eating and drinking  Eat dry toast or crackers before getting out of bed.  Eat 5 or 6 small meals a day.  Eat dry and bland foods like rice and baked potatoes.  Do not eat greasy, fatty, or spicy foods.  Have someone cook for you if the smell of food causes you to feel sick or throw up.  If you feel sick to your stomach after taking prenatal vitamins, take them at night or with a  snack.  Eat protein when you need a snack. Nuts, yogurt, and cheese are good choices.  Drink fluids throughout the day.  Try ginger ale made with real ginger, ginger tea made from fresh grated ginger, or ginger candies. General instructions  Do not use any products that have nicotine or tobacco in them, such as cigarettes and e-cigarettes. If you need help quitting, ask your doctor.  Use an air purifier to keep the air in your house free of smells.  Get lots of fresh air.  Try to avoid smells that make you feel sick.  Try: ? Wearing a bracelet that is used for seasickness (acupressure wristband). ? Going to a doctor who puts thin needles into certain body points (acupuncture) to improve how you feel. Contact a doctor if:  You need medicine to feel better.  You feel dizzy or light-headed.  You are losing weight. Get help right away if:  You feel very sick to your stomach and cannot stop throwing up.  You pass out (faint).  You have very bad pain in your belly. Summary  Morning sickness is when you feel sick to your stomach (nauseous) during pregnancy.  You may feel sick in the morning, but you can feel this way at any time of day.  Making some changes to what you eat may help your symptoms go  away. This information is not intended to replace advice given to you by your health care provider. Make sure you discuss any questions you have with your health care provider. Document Released: 04/05/2004 Document Revised: 02/08/2017 Document Reviewed: 03/29/2016 Elsevier Patient Education  2020 [employer].

## 2019-01-05 NOTE — MAU Note (Signed)
Patient reports to nurse that she is frustrated she has been waiting 4 hours to be seen.  Reports she is having abdominal cramping that comes and goes.  Hasn't been seen for her pregnancy yet-is supposed to have an appointment w/ [place] in mid-November.  States she has tried zofran and another pill given to her by a family member but vomited both back up.  Reports she is only able to keep down applesauce, apple juice, and hamburger helper.  No VB.
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
## 2019-01-05 NOTE — MAU Note (Signed)
.   Bonnie Cobb is a 23 y.o. at [redacted]w[redacted]d here in MAU reporting: nausea and vomiting that is ongoing. States she is not able to keep anything down.  LMP: 10/27/18 Onset of complaint: ongoing Pain score: 6 Vitals:   01/05/19 1726 01/05/19 1727  BP:  (!) 146/82  Pulse:  87  Resp:  18  Temp:  98.2 F (36.8 C)  SpO2: 100%      FHT: Lab orders placed from triage: UA

## 2019-01-07 ENCOUNTER — Telehealth (HOSPITAL_COMMUNITY): Payer: Self-pay

## 2019-01-07 NOTE — Telephone Encounter (Signed)
AHRIANA GUNKEL  [birth year] Smart start nurse to see patient one week from d/c (/) for blood pressure check.  Please report results to [facility] at [phone].   Patient called and verified her identity via birth date.  Patient agreeable to discuss medication prescriptions via phone and was informed of receipt that medications were sent to [facility] on [address].  Patient confirms this and states that she was unable to pick up the Diclegis because the pharmacy did not have it in stock.  Patient goes on to state that she can not afford to pay for the medication if it is going to cost $50-$60.  Patient informed that she can call the primary pharmacy, discuss costs, and then have the script transferred to another pharmacy that accepts her insurance and has the medication in stock.  Patient verbalized understanding and had no other questions or concerns.  Maryann Conners MSN, CNM Advanced Practice Provider, Center for [facility]  **This visit was completed, in its entirety, via telehealth communications.  I personally spent >/=4 minutes on the phone providing recommendations, education, and guidance.**

## 2019-01-13 ENCOUNTER — Ambulatory Visit: Payer: Medicaid Other | Admitting: *Deleted

## 2019-01-13 ENCOUNTER — Other Ambulatory Visit: Payer: Self-pay

## 2019-01-13 DIAGNOSIS — Z3480 Encounter for supervision of other normal pregnancy, unspecified trimester: Secondary | ICD-10-CM | POA: Insufficient documentation

## 2019-01-13 DIAGNOSIS — Z3481 Encounter for supervision of other normal pregnancy, first trimester: Secondary | ICD-10-CM

## 2019-01-13 MED ORDER — VITAFOL GUMMIES 3.33-0.333-34.8 MG PO CHEW: 3.0000 | CHEWABLE_TABLET | Freq: Every day | ORAL | 11 refills | Status: DC

## 2019-01-13 MED ORDER — BLOOD PRESSURE MONITOR KIT: 1.0000 | PACK | Freq: Once | 0 refills | Status: AC

## 2019-01-13 NOTE — Progress Notes (Signed)
I have reviewed this chart and agree with the RN/CMA assessment and management.    K. Meryl Davis, M.D. Attending [facility] (Faculty Practice)   

## 2019-01-13 NOTE — Progress Notes (Signed)
I connected with  Bennye Alm on 01/13/19 by a telemedicine application and verified that I am speaking with the correct person using two identifiers.   I discussed the limitations of evaluation and management by telemedicine. The patient expressed understanding and agreed to proceed.  PRENATAL INTAKE SUMMARY  Ms. Ramp presents today New OB Nurse Interview.  OB History    Gravida  2   Para      Term      Preterm      AB  1   Living  0     SAB  1   TAB      Ectopic      Multiple      Live Births             I have reviewed the patient's medical, obstetrical, social, and family histories, medications, and available lab results.  SUBJECTIVE She complains of nausea   OBJECTIVE Televisit today for intake Initial Physical Exam (New OB)  ASSESSMENT Pt doing well with some nausea.   PLAN Prenatal care at [facility] Labs and Exam at [facility] visit. PNV and BP cuff to be ordered today.

## 2019-01-22 ENCOUNTER — Encounter: Payer: Medicaid Other | Admitting: Certified Nurse Midwife

## 2019-01-27 IMAGING — CR DG CHEST 2V
2 series · 2 of 2 positions shown · non-contrast
Comparison: 01/30/2018

CLINICAL DATA: Right breast pain

EXAM:
CHEST - 2 VIEW

[w chest pa]
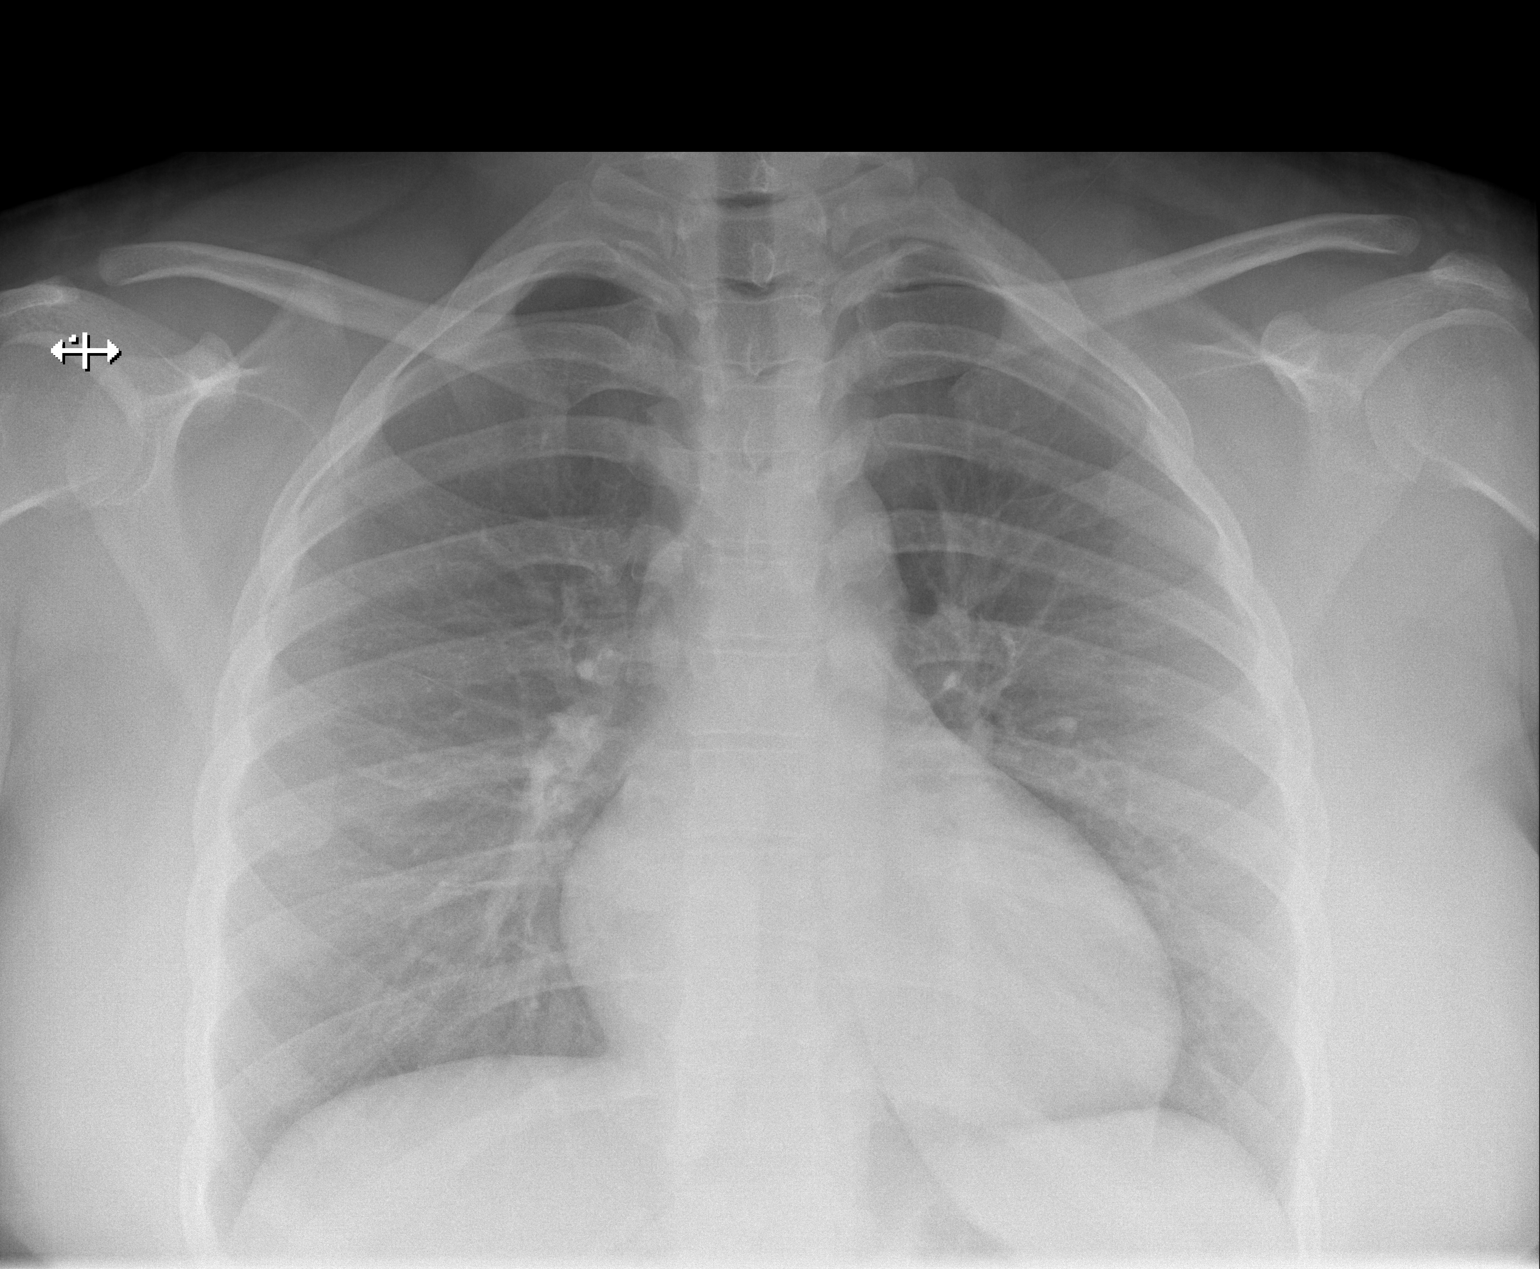

[w chest lat]
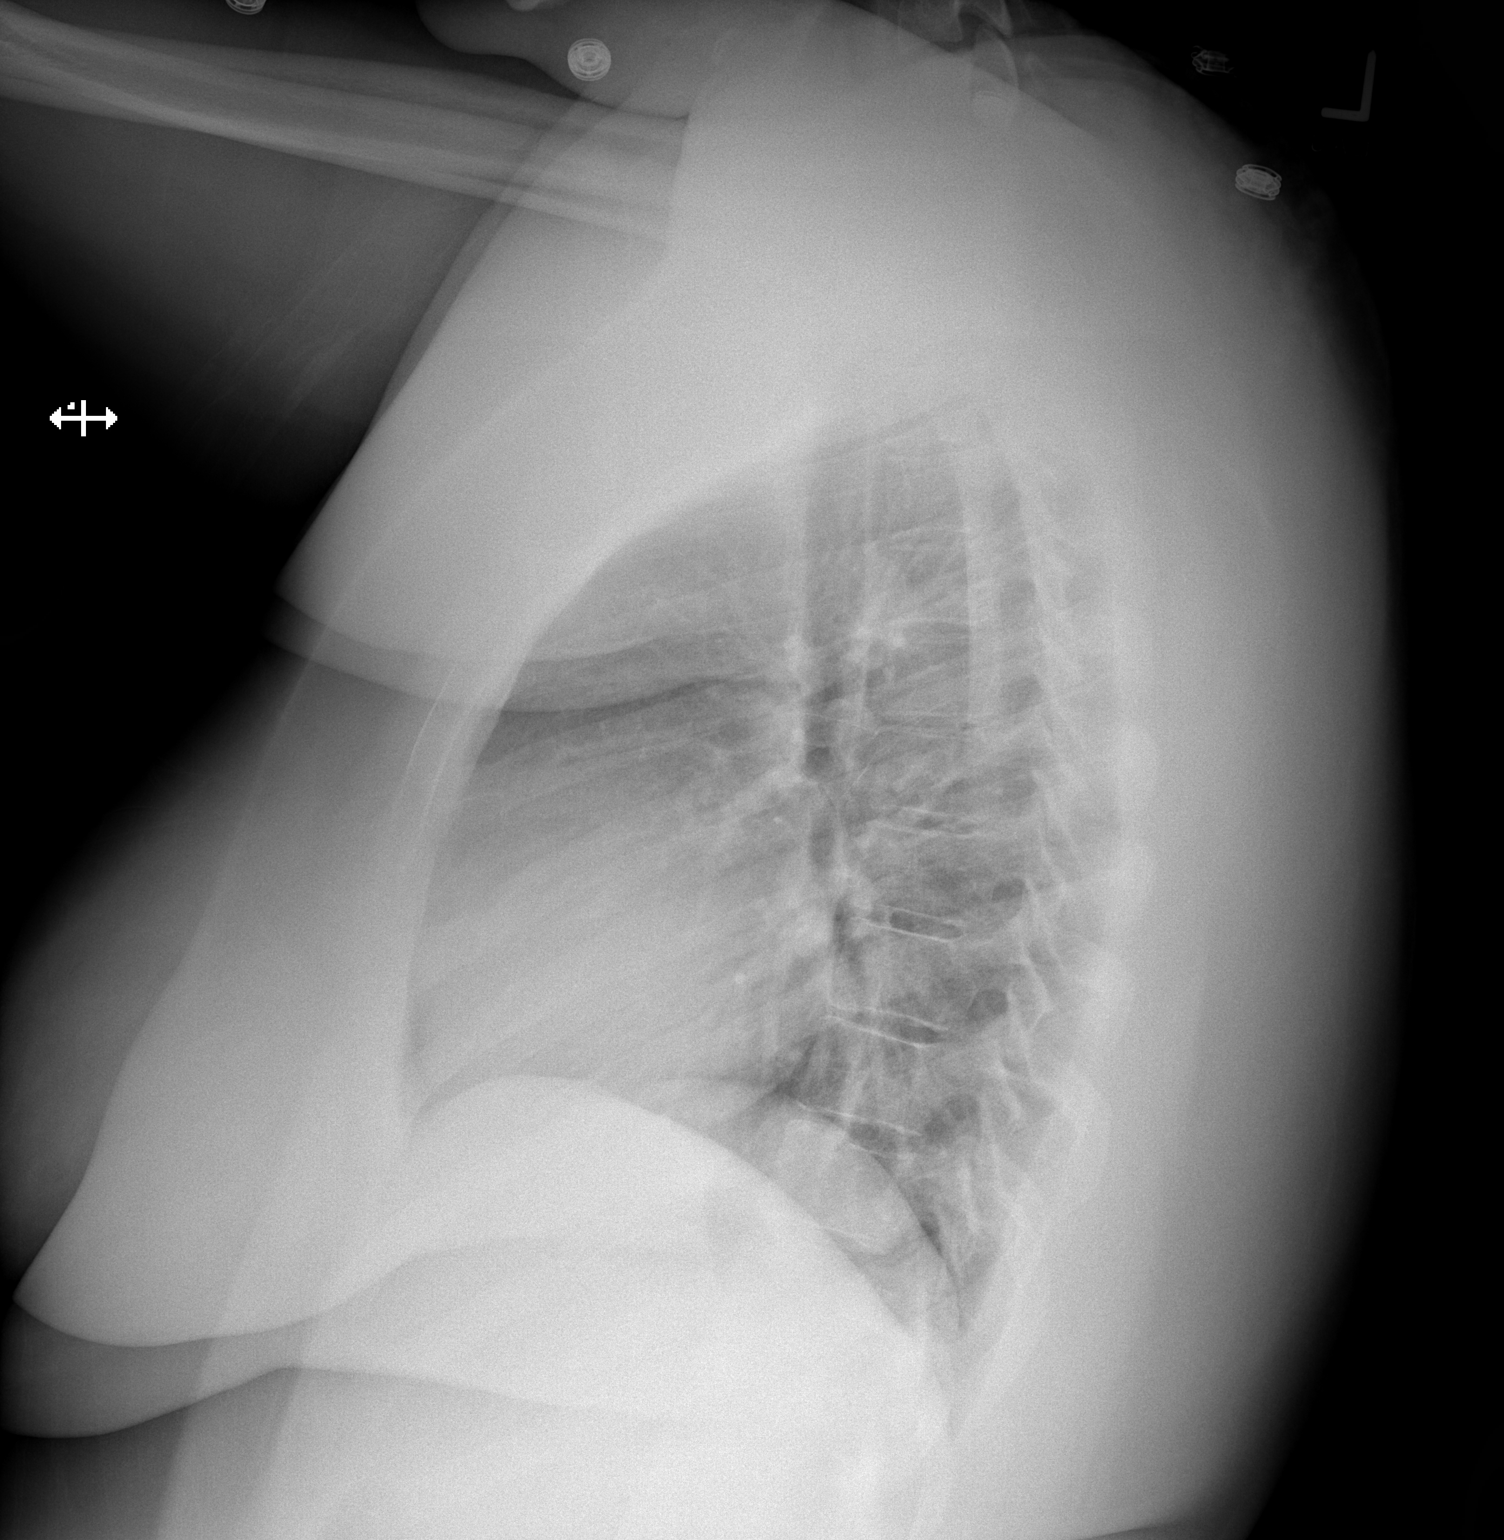

[2 of 2 positions shown; findings below may reference images not displayed]

FINDINGS: The heart size and mediastinal contours are within normal limits.
Both lungs are clear. The visualized skeletal structures are
unremarkable.
IMPRESSION: No active cardiopulmonary disease.
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
## 2019-04-01 ENCOUNTER — Other Ambulatory Visit (HOSPITAL_COMMUNITY)
Admission: RE | Admit: 2019-04-01 | Discharge: 2019-04-01 | Disposition: A | Discharge status: Home / Self Care | Payer: Medicaid Other | Source: Ambulatory Visit

## 2019-04-01 ENCOUNTER — Encounter: Payer: Self-pay | Admitting: General Practice

## 2019-04-01 ENCOUNTER — Other Ambulatory Visit: Payer: Self-pay

## 2019-04-01 ENCOUNTER — Ambulatory Visit (INDEPENDENT_AMBULATORY_CARE_PROVIDER_SITE_OTHER): Payer: Medicaid Other

## 2019-04-01 VITALS — BP 124/78 | HR 106 | Temp 97.9°F | Wt 250.0 lb

## 2019-04-01 DIAGNOSIS — Z3A22 22 weeks gestation of pregnancy: Secondary | ICD-10-CM

## 2019-04-01 DIAGNOSIS — Z8659 Personal history of other mental and behavioral disorders: Secondary | ICD-10-CM | POA: Insufficient documentation

## 2019-04-01 DIAGNOSIS — O99212 Obesity complicating pregnancy, second trimester: Secondary | ICD-10-CM

## 2019-04-01 DIAGNOSIS — O99210 Obesity complicating pregnancy, unspecified trimester: Secondary | ICD-10-CM | POA: Insufficient documentation

## 2019-04-01 DIAGNOSIS — Z3480 Encounter for supervision of other normal pregnancy, unspecified trimester: Secondary | ICD-10-CM

## 2019-04-01 LAB — POCT URINALYSIS DIPSTICK OB
Bilirubin, UA: NEGATIVE
Blood, UA: NEGATIVE
Glucose, UA: NEGATIVE
Ketones, UA: NEGATIVE
Nitrite, UA: NEGATIVE
Spec Grav, UA: 1.025 (ref 1.010–1.025)
Urobilinogen, UA: 0.2 E.U./dL
pH, UA: 7 (ref 5.0–8.0)

## 2019-04-01 MED ORDER — GOJJI WEIGHT SCALE MISC: 1.0000 | Freq: Every day | 0 refills | Status: DC | PRN

## 2019-04-01 MED ORDER — ASPIRIN EC 81 MG PO TBEC: 81.0000 mg | DELAYED_RELEASE_TABLET | Freq: Every day | ORAL | 2 refills | Status: DC

## 2019-04-01 NOTE — Patient Instructions (Signed)

## 2019-04-01 NOTE — BH Specialist Note (Signed)
Integrated Behavioral Health Initial Visit  MRN: [id number] Name: Bonnie Cobb  Number of Integrated Behavioral Health Clinician visits:: 1/3 Session Start time: 3:37pm  Session End time: 3:57pm Total time: 20  Type of Service: Integrated Behavioral Health- Individual  Interpretor:No  Interpretor Name and Language:    Warm Hand Off Completed.       SUBJECTIVE: Bonnie Cobb is a 24 y.o. female  Patient was referred by [facility] for Integrated Behavioral Health Patient reports the following symptoms/concerns: No energy, poor appetite, depressed and anxious Duration of problem: Approx 3 months; Severity of problem: Mild   OBJECTIVE: Mood: Good  and Affect: Normal  Risk of harm to self or others: None   LIFE CONTEXT: Family and Social: Patient reports unstable housing moving around from grandmother, aunt and friend house.  School/Work: Patient reports she is currently working  Self-Care: Patient reports poor self care such as poor eating habits.  Life Changes: Looking for apartment  GOALS ADDRESSED: Patient will: 1. Reduce symptoms of: anxiety 2. Increase knowledge and/or ability of: Mindfulness and stress relieving exercises   Demonstrate ability to: Decrease assessment scores and obtain stable housing   INTERVENTIONS: Interventions utilized: Brief support   Standardized Assessments completed: PHQ9 score 8, GAD7 Score 9  ASSESSMENT: Patient currently experiencing unstable housing and anxiety.    Patient may benefit from psychotherapy   PLAN: 1. Follow up with behavioral health clinician on : 3 weeks  2. Behavioral recommendations: Continue psychotherapy 3. Referral(s): None  4. "From scale of 1-10, how likely are you to follow plan?":   Gwyndolyn Saxon, LCSW

## 2019-04-01 NOTE — Assessment & Plan Note (Signed)
Recommendations [x ] Aspirin 81 mg daily after 12 weeks; discontinue after 36 weeks [ ]  Nutrition consult [ ]  Weight gain 11-20 lbs for singleton and 25-35 lbs for twin pregnancy (IOM guidelines) . Higher class of obesity patients recommended to gain closer to lower limit  . Weight loss is associated with adverse outcomes ] Screen for DM with A1C or early 2 hr GTT 09-15-1993 ] Baseline and surveillance labs (pulled in from [facility], refresh links as needed)  Lab Results  Component Value Date   PLT 408 (H) 06/02/2018   CREATININE 0.74 03/11/2018   AST 25 03/11/2018   ALT 18 03/11/2018    Antenatal Testing: Not indicated.  [ ]  Growth scans every 4-6 weeks as needed (fundal height likely inadequate in morbidly obese patients)  Postpartum Care: [ ]  Consider prophylactic wound vac/PICO for C/S [ ]  Lovenox for DVT/PE prophylaxis (6 hours after vaginal delivery, 12 hours after C/S).    Lovenox 40 mg [place] q24h (BMI 30.0-39.9 kg/m2)   Lovenox 0.5 mg/kg [place] q12h ((BMI ?40 kg/m2 ); Max 150 mg  q12h.   Consider prolonged therapy x 6 weeks PP in very concerning patients (I.e morbid obesity with other co-morbidities that increase risk of DVT/PE) [ ]  Counsel about diet, exercise and weight loss. Referrals PRN.
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
## 2019-04-02 ENCOUNTER — Other Ambulatory Visit: Payer: Self-pay | Admitting: *Deleted

## 2019-04-02 ENCOUNTER — Other Ambulatory Visit: Payer: Self-pay

## 2019-04-02 DIAGNOSIS — O99012 Anemia complicating pregnancy, second trimester: Secondary | ICD-10-CM

## 2019-04-02 DIAGNOSIS — O98212 Gonorrhea complicating pregnancy, second trimester: Secondary | ICD-10-CM

## 2019-04-02 DIAGNOSIS — O219 Vomiting of pregnancy, unspecified: Secondary | ICD-10-CM

## 2019-04-02 DIAGNOSIS — Z3480 Encounter for supervision of other normal pregnancy, unspecified trimester: Secondary | ICD-10-CM

## 2019-04-02 DIAGNOSIS — A5901 Trichomonal vulvovaginitis: Secondary | ICD-10-CM

## 2019-04-02 DIAGNOSIS — O98819 Other maternal infectious and parasitic diseases complicating pregnancy, unspecified trimester: Secondary | ICD-10-CM

## 2019-04-02 DIAGNOSIS — A749 Chlamydial infection, unspecified: Secondary | ICD-10-CM | POA: Insufficient documentation

## 2019-04-02 HISTORY — DX: Anemia complicating pregnancy, second trimester: O99.012

## 2019-04-02 HISTORY — DX: Gonorrhea complicating pregnancy, second trimester: O98.212

## 2019-04-02 LAB — CERVICOVAGINAL ANCILLARY ONLY
Bacterial Vaginitis (gardnerella): POSITIVE — AB
Candida Glabrata: NEGATIVE
Candida Vaginitis: NEGATIVE
Chlamydia: POSITIVE — AB
Comment: NEGATIVE
Comment: NEGATIVE
Comment: NEGATIVE
Comment: NEGATIVE
Comment: NEGATIVE
Comment: NORMAL
Neisseria Gonorrhea: POSITIVE — AB
Trichomonas: POSITIVE — AB

## 2019-04-02 LAB — COMPREHENSIVE METABOLIC PANEL
ALT: 28 IU/L (ref 0–32)
AST: 24 IU/L (ref 0–40)
Albumin/Globulin Ratio: 1.3 (ref 1.2–2.2)
Albumin: 4 g/dL (ref 3.9–5.0)
Alkaline Phosphatase: 69 IU/L (ref 39–117)
BUN/Creatinine Ratio: 8 — ABNORMAL LOW (ref 9–23)
BUN: 5 mg/dL — ABNORMAL LOW (ref 6–20)
Bilirubin Total: <0.2 mg/dL (ref 0.0–1.2)
CO2: 22 mmol/L (ref 20–29)
Calcium: 9.6 mg/dL (ref 8.7–10.2)
Chloride: 104 mmol/L (ref 96–106)
Creatinine, Ser: 0.63 mg/dL (ref 0.57–1.00)
GFR calc Af Amer: 148 mL/min/{1.73_m2} (ref >59)
GFR calc non Af Amer: 129 mL/min/{1.73_m2} (ref >59)
Globulin, Total: 3 g/dL (ref 1.5–4.5)
Glucose: 84 mg/dL (ref 65–99)
Potassium: 4.1 mmol/L (ref 3.5–5.2)
Sodium: 139 mmol/L (ref 134–144)
Total Protein: 7 g/dL (ref 6.0–8.5)

## 2019-04-02 MED ORDER — PROMETHAZINE HCL 25 MG PO TABS: 25.0000 mg | ORAL_TABLET | Freq: Four times a day (QID) | ORAL | 1 refills | Status: DC | PRN

## 2019-04-02 MED ORDER — METRONIDAZOLE 500 MG PO TABS: 2000.0000 mg | ORAL_TABLET | Freq: Once | ORAL | 0 refills | Status: AC

## 2019-04-02 MED ORDER — PRENATAL 27-0.8 MG PO TABS: 1.0000 | ORAL_TABLET | Freq: Every day | ORAL | 12 refills | Status: DC

## 2019-04-02 MED ORDER — DOXYLAMINE-PYRIDOXINE 10-10 MG PO TBEC: 2.0000 | DELAYED_RELEASE_TABLET | Freq: Every evening | ORAL | 2 refills | Status: DC | PRN

## 2019-04-02 MED ORDER — FERROUS SULFATE 325 (65 FE) MG PO TBEC: 325.0000 mg | DELAYED_RELEASE_TABLET | Freq: Every day | ORAL | 3 refills | Status: DC

## 2019-04-02 MED ORDER — AZITHROMYCIN 500 MG PO TABS: 1000.0000 mg | ORAL_TABLET | Freq: Once | ORAL | 0 refills | Status: AC

## 2019-04-02 NOTE — Telephone Encounter (Signed)
Patient called stating that her medications were not sent to pharmacy. PNV and medications for n/v sent.  Clovis Pu, RN

## 2019-04-03 LAB — OBSTETRIC PANEL, INCLUDING HIV
Basophils Absolute: 0 10*3/uL (ref 0.0–0.2)
Basos: 0 %
EOS (ABSOLUTE): 0.1 10*3/uL (ref 0.0–0.4)
Eos: 1 %
HIV Screen 4th Generation wRfx: NONREACTIVE
Hematocrit: 32 % — ABNORMAL LOW (ref 34.0–46.6)
Hemoglobin: 10.7 g/dL — ABNORMAL LOW (ref 11.1–15.9)
Hepatitis B Surface Ag: NEGATIVE
Immature Grans (Abs): 0.1 10*3/uL (ref 0.0–0.1)
Immature Granulocytes: 1 %
Lymphocytes Absolute: 2.2 10*3/uL (ref 0.7–3.1)
Lymphs: 27 %
MCH: 25.2 pg — ABNORMAL LOW (ref 26.6–33.0)
MCHC: 33.4 g/dL (ref 31.5–35.7)
MCV: 75 fL — ABNORMAL LOW (ref 79–97)
Monocytes Absolute: 0.5 10*3/uL (ref 0.1–0.9)
Monocytes: 6 %
Neutrophils Absolute: 5.4 10*3/uL (ref 1.4–7.0)
Neutrophils: 65 %
Platelets: 331 10*3/uL (ref 150–450)
RBC: 4.25 x10E6/uL (ref 3.77–5.28)
RDW: 14.7 % (ref 11.7–15.4)
RPR Ser Ql: NONREACTIVE
Rh Factor: POSITIVE
Rubella Antibodies, IGG: 5.62 index (ref >0.99)
WBC: 8.2 10*3/uL (ref 3.4–10.8)

## 2019-04-03 LAB — HEMOGLOBIN A1C
Est. average glucose Bld gHb Est-mCnc: 108 mg/dL
Hgb A1c MFr Bld: 5.4 % (ref 4.8–5.6)

## 2019-04-03 LAB — AB SCR+ANTIBODY ID: Antibody Screen: POSITIVE — AB

## 2019-04-04 ENCOUNTER — Encounter (HOSPITAL_COMMUNITY): Payer: Self-pay

## 2019-04-04 DIAGNOSIS — R8271 Bacteriuria: Secondary | ICD-10-CM | POA: Insufficient documentation

## 2019-04-06 ENCOUNTER — Other Ambulatory Visit: Payer: Self-pay | Admitting: *Deleted

## 2019-04-06 DIAGNOSIS — Z3480 Encounter for supervision of other normal pregnancy, unspecified trimester: Secondary | ICD-10-CM

## 2019-04-06 DIAGNOSIS — Z3481 Encounter for supervision of other normal pregnancy, first trimester: Secondary | ICD-10-CM

## 2019-04-06 LAB — URINE CULTURE, OB REFLEX

## 2019-04-06 LAB — CULTURE, OB URINE

## 2019-04-06 NOTE — Progress Notes (Signed)
Ultrasound order needed to be changed to detailed from ob complete.  Clovis Pu, RN

## 2019-04-07 ENCOUNTER — Ambulatory Visit: Payer: Medicaid Other | Admitting: Registered"

## 2019-04-07 ENCOUNTER — Encounter: Payer: Medicaid Other | Attending: Obstetrics & Gynecology | Admitting: Registered"

## 2019-04-07 ENCOUNTER — Other Ambulatory Visit: Payer: Self-pay

## 2019-04-07 DIAGNOSIS — O99210 Obesity complicating pregnancy, unspecified trimester: Secondary | ICD-10-CM | POA: Diagnosis present

## 2019-04-07 DIAGNOSIS — Z3A00 Weeks of gestation of pregnancy not specified: Secondary | ICD-10-CM | POA: Diagnosis not present

## 2019-04-07 DIAGNOSIS — E669 Obesity, unspecified: Secondary | ICD-10-CM | POA: Insufficient documentation

## 2019-04-07 DIAGNOSIS — R8271 Bacteriuria: Secondary | ICD-10-CM

## 2019-04-09 MED ORDER — PENICILLIN V POTASSIUM 500 MG PO TABS: 500.0000 mg | ORAL_TABLET | Freq: Two times a day (BID) | ORAL | 0 refills | Status: DC

## 2019-04-09 NOTE — Progress Notes (Signed)
Medical Nutrition Therapy  Assessment:  Primary concerns today: appropriate nutritional intake for pregnancy.  Pt states she had a message through MyChart that she had pre-diabetes and was very concerned. Pt showed RD message which stated she was screened for pre-diabetes and her results came back normal. Pt misunderstood the message and was relieved to know she does not have pre-diabetes.   Patient works part-time at nursing home. On days off doesn't have much energy and would like to have more motivation to move. Pt states she does not like that she feels winded after climbing 1 flight of stairs.   Pt states she was going to decide whether or not to take iron prescription based on price.  Preferred Learning Style:   No preference indicated   MEDICATIONS: reviewed   DIETARY INTAKE:  Usual eating pattern includes 2-3 meals and 1-2 snacks per day.  24-hr recall:  B ( AM): sausage egg cheese croissant, 8 oz OJ 1/2 c milk  Snk ( AM): none  L ( PM): hamburger helper Snk ( PM): 1 package peach oatmeal D ( PM): hamburger, cheese, Koolaid Snk ( PM): none Beverages: water, OJ, apple juice, few sips of soda when needs to burp  Usual physical activity: none; wants to go to gym and do yoga.  Progress Towards Goal(s):  New goals.   Nutritional Diagnosis:  NI-5.10.1 Inadequate mineral intake (specify): iron As related to low intake of iron rich foods and hasn't started iron supplement yet.  As evidenced by diet history, reported low energy, anemia in problem list.    Intervention:  Nutrition Education. Discussed importance of iron for health and energy. Discussed balanced eating. Discussed role of exercise in health. Discussed changes to expect during pregnancy related to OGTT and risk for GDM due to family history of T2DM. Discussed importance of sleep.  Handouts given during visit include:  MyPlate for Moms  Barriers to learning/adherence to lifestyle change: none  Demonstrated  degree of understanding via:  Teach Back   Monitoring/Evaluation:  Dietary intake, exercise, and body weight prn.

## 2019-04-10 ENCOUNTER — Ambulatory Visit (HOSPITAL_COMMUNITY)
Admission: RE | Admit: 2019-04-10 | Discharge: 2019-04-10 | Disposition: A | Discharge status: Home / Self Care | Payer: Medicaid Other | Source: Ambulatory Visit | Attending: Obstetrics and Gynecology | Admitting: Obstetrics and Gynecology

## 2019-04-10 ENCOUNTER — Ambulatory Visit (HOSPITAL_COMMUNITY): Payer: Medicaid Other

## 2019-04-10 ENCOUNTER — Other Ambulatory Visit: Payer: Self-pay

## 2019-04-10 ENCOUNTER — Other Ambulatory Visit (HOSPITAL_COMMUNITY): Payer: Self-pay | Admitting: *Deleted

## 2019-04-10 DIAGNOSIS — Z3480 Encounter for supervision of other normal pregnancy, unspecified trimester: Secondary | ICD-10-CM

## 2019-04-10 DIAGNOSIS — Z362 Encounter for other antenatal screening follow-up: Secondary | ICD-10-CM

## 2019-04-10 DIAGNOSIS — Z3A23 23 weeks gestation of pregnancy: Secondary | ICD-10-CM | POA: Diagnosis not present

## 2019-04-10 DIAGNOSIS — O99212 Obesity complicating pregnancy, second trimester: Secondary | ICD-10-CM | POA: Diagnosis not present

## 2019-04-12 ENCOUNTER — Encounter: Payer: Self-pay | Admitting: Obstetrics & Gynecology

## 2019-04-12 DIAGNOSIS — O361990 Maternal care for other isoimmunization, unspecified trimester, not applicable or unspecified: Secondary | ICD-10-CM | POA: Insufficient documentation

## 2019-04-12 HISTORY — DX: Maternal care for other isoimmunization, unspecified trimester, not applicable or unspecified: O36.1990

## 2019-04-14 ENCOUNTER — Ambulatory Visit (INDEPENDENT_AMBULATORY_CARE_PROVIDER_SITE_OTHER): Payer: Medicaid Other | Admitting: *Deleted

## 2019-04-14 ENCOUNTER — Other Ambulatory Visit: Payer: Self-pay

## 2019-04-14 VITALS — BP 117/70 | HR 98 | Temp 97.8°F | Wt 249.0 lb

## 2019-04-14 DIAGNOSIS — N898 Other specified noninflammatory disorders of vagina: Secondary | ICD-10-CM

## 2019-04-14 DIAGNOSIS — O98212 Gonorrhea complicating pregnancy, second trimester: Secondary | ICD-10-CM

## 2019-04-14 DIAGNOSIS — Z3A24 24 weeks gestation of pregnancy: Secondary | ICD-10-CM | POA: Diagnosis not present

## 2019-04-14 MED ORDER — CEFTRIAXONE SODIUM 500 MG IJ SOLR
500.0000 mg | Freq: Once | INTRAMUSCULAR | Status: AC
  Administered 2019-04-14: 500 mg via INTRAMUSCULAR

## 2019-04-14 MED ORDER — TERCONAZOLE 0.4 % VA CREA: 1.0000 | TOPICAL_CREAM | Freq: Every day | VAGINAL | 0 refills | Status: DC

## 2019-04-14 NOTE — Progress Notes (Signed)
   S: Patient in nurse clinic today for STD treatment of gonorrhea.    O: Need for treatment of gonorrhea.  A: Ceftriaxone 500 mg IM x 1 given in Right Deltoid per standing orders/Protocols.  Patient refused to have administered by deep intra-gluteal.  Patient observed 15 minutes in office.  No reaction noted.   P: Patient to follow up in 3 month for re-screening.    STD report form fax completed and faxed to [facility] Department at [phone] (STD department).     Patient advised to abstain from sex for 7-10 days after treatment or when partner has been tested/treated.     Clovis Pu, RN

## 2019-04-15 ENCOUNTER — Encounter (HOSPITAL_COMMUNITY): Payer: Self-pay | Admitting: Family Medicine

## 2019-04-15 ENCOUNTER — Encounter: Payer: Self-pay | Admitting: General Practice

## 2019-04-15 ENCOUNTER — Other Ambulatory Visit: Payer: Self-pay

## 2019-04-15 ENCOUNTER — Inpatient Hospital Stay (HOSPITAL_COMMUNITY)
Admission: AD | Admit: 2019-04-15 | Discharge: 2019-04-15 | Disposition: A | Discharge status: Home / Self Care | Payer: Medicaid Other | Attending: Family Medicine | Admitting: Family Medicine

## 2019-04-15 ENCOUNTER — Telehealth: Payer: Self-pay | Admitting: Licensed Clinical Social Worker

## 2019-04-15 DIAGNOSIS — R102 Pelvic and perineal pain: Secondary | ICD-10-CM | POA: Diagnosis present

## 2019-04-15 DIAGNOSIS — O26892 Other specified pregnancy related conditions, second trimester: Secondary | ICD-10-CM

## 2019-04-15 DIAGNOSIS — Z3480 Encounter for supervision of other normal pregnancy, unspecified trimester: Secondary | ICD-10-CM

## 2019-04-15 DIAGNOSIS — Z87891 Personal history of nicotine dependence: Secondary | ICD-10-CM | POA: Diagnosis not present

## 2019-04-15 DIAGNOSIS — N949 Unspecified condition associated with female genital organs and menstrual cycle: Secondary | ICD-10-CM

## 2019-04-15 DIAGNOSIS — Z3A24 24 weeks gestation of pregnancy: Secondary | ICD-10-CM

## 2019-04-15 LAB — URINALYSIS, ROUTINE W REFLEX MICROSCOPIC
Bilirubin Urine: NEGATIVE
Glucose, UA: NEGATIVE mg/dL
Hgb urine dipstick: NEGATIVE
Ketones, ur: NEGATIVE mg/dL
Nitrite: NEGATIVE
Protein, ur: NEGATIVE mg/dL
Specific Gravity, Urine: 1.009 (ref 1.005–1.030)
pH: 7 (ref 5.0–8.0)

## 2019-04-15 MED ORDER — CYCLOBENZAPRINE HCL 5 MG PO TABS: 5.0000 mg | ORAL_TABLET | Freq: Three times a day (TID) | ORAL | 0 refills | Status: DC | PRN

## 2019-04-15 MED ORDER — CYCLOBENZAPRINE HCL 10 MG PO TABS
10.0000 mg | ORAL_TABLET | Freq: Once | ORAL | Status: AC
  Administered 2019-04-15: 21:00:00 10 mg via ORAL
  Filled 2019-04-15: qty 1

## 2019-04-15 NOTE — Telephone Encounter (Signed)
Advised patient [facility] office has information and will call to set up benefits on 04/21/2019 @ 1015am

## 2019-04-15 NOTE — MAU Note (Signed)
Arrived by EMS.  Sharp pain in lower abd "like something is sticking me" comes and goes - been going on for 15 min. And been having pain when turns from side to side in lower abd x 1 month.  Received medications for trich, gonorrhea, and chlamydia yesterday.  No bleeding. Baby moving well.

## 2019-04-15 NOTE — MAU Provider Note (Signed)
Chief Complaint:  No chief complaint on file.   First Provider Initiated Contact with Patient 04/15/19 2010     HPI: Bonnie Cobb is a 24 y.o. G2P0010 at 34w2dwho presents to maternity admissions reporting pain in pelvis and pubic bone for a month.  Feels worse tonight.  Recently diagnosed with 3 STDs and treated yesterday. Pain is on lower sides of uterus and along pubic bone. Hurts more when she turns over in bed or walks. . She reports good fetal movement, denies LOF, vaginal bleeding, vaginal itching/burning, urinary symptoms, h/a, dizziness, n/v, diarrhea, constipation or fever/chills.  She denies headache, visual changes or RUQ abdominal pain.  Abdominal Pain This is a recurrent problem. The current episode started 1 to 4 weeks ago. The onset quality is gradual. The problem occurs intermittently. The problem has been unchanged. The pain is located in the suprapubic region, LLQ and RLQ. The pain is moderate. The quality of the pain is cramping, sharp and tearing. The abdominal pain does not radiate. Pertinent negatives include no anorexia, constipation, diarrhea, dysuria, fever, headaches, myalgias, nausea or vomiting. The pain is aggravated by palpation and movement. The pain is relieved by nothing. She has tried nothing for the symptoms.    RN Note: Arrived by EMS.  Sharp pain in lower abd "like something is sticking me" comes and goes - been going on for 15 min. And been having pain when turns from side to side in lower abd x 1 month.  Received medications for trich, gonorrhea, and chlamydia yesterday.  No bleeding. Baby moving well.   Past Medical History: Past Medical History:  Diagnosis Date  . ADHD (attention deficit hyperactivity disorder)   . Depression   . PTSD (post-traumatic stress disorder)   . PTSD (post-traumatic stress disorder)     Past obstetric history: OB History  Gravida Para Term Preterm AB Living  2       1 0  SAB TAB Ectopic Multiple Live Births  1             # Outcome Date GA Lbr Len/2nd Weight Sex Delivery Anes PTL Lv  2 Current           1 SAB             Past Surgical History: Past Surgical History:  Procedure Laterality Date  . NO PAST SURGERIES      Family History: Family History  Problem Relation Age of Onset  . Healthy Mother   . Healthy Father     Social History: Social History   Tobacco Use  . Smoking status: Former Smoker    Types: Cigars  . Smokeless tobacco: Never Used  Substance Use Topics  . Alcohol use: No  . Drug use: Not Currently    Types: Marijuana    Comment: Last smoked 05/30/2018    Allergies: No Known Allergies  Meds:  Medications Prior to Admission  Medication Sig Dispense Refill Last Dose  . aspirin EC 81 MG tablet Take 1 tablet (81 mg total) by mouth daily. 60 tablet 2 Past Week at Unknown time  . penicillin v potassium (VEETID) 500 MG tablet Take 1 tablet (500 mg total) by mouth 2 (two) times daily. 14 tablet 0 04/15/2019 at Unknown time  . Prenatal Vit-Fe Phos-FA-Omega (VITAFOL GUMMIES) 3.33-0.333-34.8 MG CHEW Chew 3 each by mouth daily. 90 tablet 11 04/15/2019 at Unknown time  . Doxylamine-Pyridoxine 10-10 MG TBEC Take 2 tablets by mouth at bedtime as needed. 60 tablet 2   .  ferrous sulfate 325 (65 FE) MG EC tablet Take 1 tablet (325 mg total) by mouth daily with breakfast. 45 tablet 3   . Misc. Devices (GOJJI WEIGHT SCALE) MISC 1 Device by Does not apply route daily as needed. To weight self daily as needed at home. ICD-10 code: O09.90 1 each 0   . Prenatal Vit-Fe Fumarate-FA (MULTIVITAMIN-PRENATAL) 27-0.8 MG TABS tablet Take 1 tablet by mouth daily at 12 noon. 30 tablet 12   . promethazine (PHENERGAN) 25 MG tablet Take 1 tablet (25 mg total) by mouth every 6 (six) hours as needed for nausea or vomiting. 30 tablet 1   . terconazole (TERAZOL 7) 0.4 % vaginal cream Place 1 applicator vaginally at bedtime. 45 g 0     I have reviewed patient's Past Medical Hx, Surgical Hx, Family Hx, Social Hx,  medications and allergies.   ROS:  Review of Systems  Constitutional: Negative for fever.  Gastrointestinal: Positive for abdominal pain. Negative for anorexia, constipation, diarrhea, nausea and vomiting.  Genitourinary: Negative for dysuria.  Musculoskeletal: Negative for myalgias.  Neurological: Negative for headaches.   Other systems negative  Physical Exam   Patient Vitals for the past 24 hrs:  BP Temp Temp src Pulse Resp SpO2 Height Weight  04/15/19 2007 116/60 -- -- 88 -- -- -- --  04/15/19 2004 -- -- -- -- -- 100 % -- --  04/15/19 1956 -- -- -- -- -- -- 5' 3.5" (1.613 m) 112.9 kg  04/15/19 1949 (!) 127/45 98.4 F (36.9 C) Oral 90 20 99 % -- --   Constitutional: Well-developed, well-nourished female in no acute distress.  Cardiovascular: normal rate and rhythm Respiratory: normal effort, clear to auscultation bilaterally GI: Abd soft, non-tender, gravid appropriate for gestational age.   No rebound or guarding. MS: Extremities nontender, no edema, normal ROM Neurologic: Alert and oriented x 4.  GU: Neg CVAT.  PELVIC EXAM: Tender over bilateral round ligaments.  Tender over symphysis pubis junction.                            Cervix long and closed    FHT:  Baseline 150 , moderate variability, small accelerations present, no decelerations Contractions: none   Labs: Results for orders placed or performed during the hospital encounter of 04/15/19 (from the past 24 hour(s))  Urinalysis, Routine w reflex microscopic     Status: Abnormal   Collection Time: 04/15/19  8:07 PM  Result Value Ref Range   Color, Urine YELLOW YELLOW   APPearance CLEAR CLEAR   Specific Gravity, Urine 1.009 1.005 - 1.030   pH 7.0 5.0 - 8.0   Glucose, UA NEGATIVE NEGATIVE mg/dL   Hgb urine dipstick NEGATIVE NEGATIVE   Bilirubin Urine NEGATIVE NEGATIVE   Ketones, ur NEGATIVE NEGATIVE mg/dL   Protein, ur NEGATIVE NEGATIVE mg/dL   Nitrite NEGATIVE NEGATIVE   Leukocytes,Ua SMALL (A) NEGATIVE    RBC / HPF 0-5 0 - 5 RBC/hpf   WBC, UA 0-5 0 - 5 WBC/hpf   Bacteria, UA RARE (A) NONE SEEN   Squamous Epithelial / LPF 0-5 0 - 5   Mucus PRESENT     A/Positive/-- (01/20 1513)  Imaging:    MAU Course/MDM: I have ordered labs and reviewed results. UA is cleat Discussed pain is likely round ligament stretching and spasm of muscles around pelvis.   NST reviewed, reassuring for Gestational Age .  Treatments in MAU included Flexeril which gave her  almost complete relief.  FHR reassuring.  No PTL.    Assessment: Single intrauterine pregnancy at [redacted]w[redacted]d Pelvic pain Pubic bone pain Round Ligament pain  Plan: Discharge home Rx Flexeril for prn use at home.  Warned about sleepiness side effect May need PT if pain persists Preterm Labor precautions and fetal kick counts Follow up in Office for prenatal visits   Encouraged to return here or to other Urgent Care/ED if she develops worsening of symptoms, increase in pain, fever, or other concerning symptoms.   Pt stable at time of discharge.  Hansel Feinstein CNM, MSN Certified Nurse-Midwife 04/15/2019 8:11 PM

## 2019-04-15 NOTE — Discharge Instructions (Signed)

## 2019-04-17 ENCOUNTER — Other Ambulatory Visit: Payer: Self-pay | Admitting: *Deleted

## 2019-04-17 DIAGNOSIS — O99210 Obesity complicating pregnancy, unspecified trimester: Secondary | ICD-10-CM

## 2019-04-20 ENCOUNTER — Other Ambulatory Visit: Payer: Self-pay

## 2019-04-28 ENCOUNTER — Other Ambulatory Visit: Payer: Self-pay

## 2019-04-28 ENCOUNTER — Encounter: Payer: Medicaid Other | Attending: Obstetrics & Gynecology | Admitting: Registered"

## 2019-04-28 ENCOUNTER — Ambulatory Visit: Payer: Medicaid Other | Admitting: Registered"

## 2019-04-28 DIAGNOSIS — O99210 Obesity complicating pregnancy, unspecified trimester: Secondary | ICD-10-CM | POA: Insufficient documentation

## 2019-04-28 DIAGNOSIS — E669 Obesity, unspecified: Secondary | ICD-10-CM | POA: Diagnosis not present

## 2019-04-28 DIAGNOSIS — Z3A00 Weeks of gestation of pregnancy not specified: Secondary | ICD-10-CM | POA: Diagnosis not present

## 2019-04-28 NOTE — Progress Notes (Signed)
Medical Nutrition Therapy  Follow-up Assessment:  Primary concerns today: appropriate nutritional intake for pregnancy.  Pt reports she has started taking her iron prescription and has increased her intake of iron-rich foods. Patient has increased fruit intake. If patient has glucose intolerance will need to decrease fruit and juice intake, but not addressed at this visit. RD will wait for the GTT results in 2 weeks to decide on next nutrition goals for pregnancy.  Patient states she does not eat much meat except fish and sometimes has [place] bacon with breakfast. Patient likely not eating enough protein.  Patient also states last night she moved back in with her grandmother and will have more space to get exercise using pregnancy exercise app she recently downloaded.  Preferred Learning Style:   No preference indicated   MEDICATIONS: reviewed   DIETARY INTAKE:  Usual eating pattern includes 2-3 meals and 1-2 snacks per day.  24-hr recall:  B ( AM): [place], egg sandwich on honey wheat OR raisin bran, 2% milk  Snk ( AM): none  L ( PM): [place] blend salad, a few croutons, ranch dressing Snk ( PM): 1 package peach oatmeal D ( PM): beef stew, rice, pinto beans, body armour water Snk ( PM): none Beverages: water, OJ, apple juice, few sips of soda when needs to burp  Usual physical activity: not assessed.  Progress Towards Goal(s):  New goals.   Nutritional Diagnosis:  NI-5.10.1 Inadequate mineral intake (specify): iron As related to low intake of iron rich foods and hasn't started iron supplement yet.  As evidenced by diet history, reported low energy, anemia in problem list.    Intervention:  Nutrition Education. Discussed importance of protein in addition to the changes she has made increasing vegetables and iron-rich foods.  Handouts given during visit include:  MyPlate for Moms (Patient was not able to open digital copy, provided hard copy today)  Barriers to  learning/adherence to lifestyle change: none  Demonstrated degree of understanding via:  Teach Back   Monitoring/Evaluation:  Dietary intake, exercise, and body weight prn.

## 2019-04-29 ENCOUNTER — Encounter: Payer: Self-pay | Admitting: Certified Nurse Midwife

## 2019-04-29 ENCOUNTER — Telehealth (INDEPENDENT_AMBULATORY_CARE_PROVIDER_SITE_OTHER): Payer: Medicaid Other | Admitting: Certified Nurse Midwife

## 2019-04-29 DIAGNOSIS — A749 Chlamydial infection, unspecified: Secondary | ICD-10-CM

## 2019-04-29 DIAGNOSIS — O98812 Other maternal infectious and parasitic diseases complicating pregnancy, second trimester: Secondary | ICD-10-CM | POA: Diagnosis not present

## 2019-04-29 DIAGNOSIS — O98212 Gonorrhea complicating pregnancy, second trimester: Secondary | ICD-10-CM | POA: Diagnosis not present

## 2019-04-29 DIAGNOSIS — R8271 Bacteriuria: Secondary | ICD-10-CM

## 2019-04-29 DIAGNOSIS — O23592 Infection of other part of genital tract in pregnancy, second trimester: Secondary | ICD-10-CM | POA: Diagnosis not present

## 2019-04-29 DIAGNOSIS — A5901 Trichomonal vulvovaginitis: Secondary | ICD-10-CM

## 2019-04-29 DIAGNOSIS — Z3A26 26 weeks gestation of pregnancy: Secondary | ICD-10-CM

## 2019-04-29 DIAGNOSIS — Z3480 Encounter for supervision of other normal pregnancy, unspecified trimester: Secondary | ICD-10-CM

## 2019-04-29 MED ORDER — CEFADROXIL 500 MG PO CAPS: 500.0000 mg | ORAL_CAPSULE | Freq: Two times a day (BID) | ORAL | 0 refills | Status: DC

## 2019-04-29 NOTE — Progress Notes (Signed)
TELEHEALTH VIRTUAL OBSTETRICS VISIT ENCOUNTER NOTE  I connected with Bonnie Cobb on 04/29/19 at  8:50 AM EST by telephone at home and verified that I am speaking with the correct person using two identifiers.  Patient reports that she is unable to do mychart appointment due to not having Internet access at this time.    I discussed the limitations, risks, security and privacy concerns of performing an evaluation and management service by telephone and the availability of in person appointments. I also discussed with the patient that there may be a patient responsible charge related to this service. The patient expressed understanding and agreed to proceed.  Subjective:  Bonnie Cobb is a 24 y.o. G2P0010 at [redacted]w[redacted]d being followed for ongoing prenatal care.  She is currently monitored for the following issues for this low-risk pregnancy and has Supervision of other normal pregnancy, antepartum; Obesity complicating pregnancy, childbirth, or puerperium, antepartum; History of depression; Chlamydia infection during pregnancy; Maternal gonorrhea in second trimester; Anemia affecting pregnancy in second trimester; GBS bacteriuria; and Maternal atypical antibody complicating pregnancy on their problem list.  Patient reports cloudiness in urine and vaginal discharge. Reports fetal movement. Denies any contractions, bleeding or leaking of fluid.   The following portions of the patient's history were reviewed and updated as appropriate: allergies, current medications, past family history, past medical history, past social history, past surgical history and problem list.   Objective:   General:  Alert, oriented and cooperative.   Mental Status: Normal mood and affect perceived. Normal judgment and thought content.  Rest of physical exam deferred due to type of encounter  Assessment and Plan:  Pregnancy: G2P0010 at [redacted]w[redacted]d 1. Supervision of other normal pregnancy, antepartum - Patient reports  that since completion of antibiotics she has been having yellow thick vaginal discharge and irritation. Also reports cloudiness with urine since completion of antibiotics  - Was prescribed terazol for yeast infection on 2/2, patient reports she has not picked up medication at this time d/t not getting paid until Friday. Discussed with patient that medication should be covered under insurance and to call office if symptoms still present after completion of medication, patient verbalizes understanding  - Routine prenatal care - Anticipatory guidance on upcoming appointments with next being in person appointment with TOC and GTT. Discussed with patient to not eat or drink after MN prior to appointment. Discussed with patient we will test to make sure STDs have resolved, patient verbalizes understanding.  - Educated and discussed RLP during pregnancy, safe medication during pregnancy and use of support belt if needed.   2. GBS bacteriuria - Upon chart review, noted patient has not been treated for GBS bacteriuria, will treat today  - cefadroxil (DURICEF) 500 MG capsule; Take 1 capsule (500 mg total) by mouth 2 (two) times daily.  Dispense: 14 capsule; Refill: 0  3. Chlamydia infection during pregnancy - Patient reports completion of medication   4. Maternal gonorrhea in second trimester - Patient reports completions of medication   5. Trichomonal vaginitis during pregnancy in second trimester - Patient reports completion of medication, patient denies IC since completion   Preterm labor symptoms and general obstetric precautions including but not limited to vaginal bleeding, contractions, leaking of fluid and fetal movement were reviewed in detail with the patient.  I discussed the assessment and treatment plan with the patient. The patient was provided an opportunity to ask questions and all were answered. The patient agreed with the plan and demonstrated an understanding of  the instructions. The  patient was advised to call back or seek an in-person office evaluation/go to [facility]U at [facility] for any urgent or concerning symptoms. Please refer to After Visit Summary for other counseling recommendations.   I provided 20 minutes of non-face-to-face time during this encounter.  Future Appointments  Date Time Provider Department Center  05/08/2019  1:30 PM [facility] NURSE [facility]  05/08/2019  1:30 PM [facility] [place] 1 [facility]-US  05/14/2019  8:10 AM Raelyn Mora, CNM [facility] None    Sharyon Cable, CNM Center for [employer], [facility]

## 2019-05-04 ENCOUNTER — Telehealth: Payer: Self-pay | Admitting: *Deleted

## 2019-05-04 DIAGNOSIS — B379 Candidiasis, unspecified: Secondary | ICD-10-CM

## 2019-05-04 MED ORDER — FLUCONAZOLE 150 MG PO TABS: 150.0000 mg | ORAL_TABLET | Freq: Once | ORAL | 0 refills | Status: DC

## 2019-05-04 NOTE — Telephone Encounter (Signed)
Patient informed that Diflucan 150 mg 1 tablet PO x1 verbal order by Raelyn Mora, CNM sent to pharmacy.  Clovis Pu, RN

## 2019-05-04 NOTE — Telephone Encounter (Signed)
-----   Message from Marti Sleigh, [place] sent at 05/04/2019  9:31 AM EST ----- Regarding: Rx Has a question about a Rx that was sent in.  She said it was urgent.

## 2019-05-04 NOTE — Telephone Encounter (Signed)
Patient called and left message with after hours line regarding the vaginal cream for yeast. Pt reported that the cream was causing her to have burning and itching feeling in vaginal area. Patient stopped using the cream.  Returned call to patient regarding symptoms from vaginal cream. Patient requested if she could have pills to take for the yeast infection. The cream is causing burning and itching feeling in vaginal area. Informed patient that nurse need to speak with midwife regarding medication. Nurse will call patient back.   Clovis Pu, RN

## 2019-05-06 ENCOUNTER — Other Ambulatory Visit: Payer: Self-pay | Admitting: *Deleted

## 2019-05-06 ENCOUNTER — Encounter: Payer: Self-pay | Admitting: Certified Nurse Midwife

## 2019-05-06 DIAGNOSIS — B379 Candidiasis, unspecified: Secondary | ICD-10-CM

## 2019-05-06 MED ORDER — FLUCONAZOLE 150 MG PO TABS: 150.0000 mg | ORAL_TABLET | Freq: Once | ORAL | 2 refills | Status: AC

## 2019-05-07 ENCOUNTER — Other Ambulatory Visit: Payer: Self-pay

## 2019-05-08 ENCOUNTER — Other Ambulatory Visit: Payer: Self-pay

## 2019-05-08 ENCOUNTER — Ambulatory Visit (HOSPITAL_COMMUNITY): Payer: Medicaid Other | Admitting: *Deleted

## 2019-05-08 ENCOUNTER — Encounter (HOSPITAL_COMMUNITY): Payer: Self-pay

## 2019-05-08 ENCOUNTER — Ambulatory Visit (HOSPITAL_COMMUNITY)
Admission: RE | Admit: 2019-05-08 | Discharge: 2019-05-08 | Disposition: A | Discharge status: Home / Self Care | Payer: Medicaid Other | Source: Ambulatory Visit | Attending: Obstetrics and Gynecology | Admitting: Obstetrics and Gynecology

## 2019-05-08 DIAGNOSIS — O23592 Infection of other part of genital tract in pregnancy, second trimester: Secondary | ICD-10-CM | POA: Insufficient documentation

## 2019-05-08 DIAGNOSIS — A5901 Trichomonal vulvovaginitis: Secondary | ICD-10-CM

## 2019-05-08 DIAGNOSIS — Z362 Encounter for other antenatal screening follow-up: Secondary | ICD-10-CM | POA: Insufficient documentation

## 2019-05-08 DIAGNOSIS — Z3480 Encounter for supervision of other normal pregnancy, unspecified trimester: Secondary | ICD-10-CM | POA: Diagnosis present

## 2019-05-08 DIAGNOSIS — Z3A27 27 weeks gestation of pregnancy: Secondary | ICD-10-CM

## 2019-05-08 DIAGNOSIS — O99212 Obesity complicating pregnancy, second trimester: Secondary | ICD-10-CM | POA: Diagnosis not present

## 2019-05-14 ENCOUNTER — Encounter: Payer: Self-pay | Admitting: General Practice

## 2019-05-14 ENCOUNTER — Ambulatory Visit (INDEPENDENT_AMBULATORY_CARE_PROVIDER_SITE_OTHER): Payer: Medicaid Other | Admitting: Obstetrics and Gynecology

## 2019-05-14 ENCOUNTER — Other Ambulatory Visit: Payer: Self-pay

## 2019-05-14 ENCOUNTER — Other Ambulatory Visit (HOSPITAL_COMMUNITY)
Admission: RE | Admit: 2019-05-14 | Discharge: 2019-05-14 | Disposition: A | Discharge status: Home / Self Care | Payer: Medicaid Other | Source: Ambulatory Visit | Attending: Obstetrics and Gynecology | Admitting: Obstetrics and Gynecology

## 2019-05-14 ENCOUNTER — Other Ambulatory Visit: Payer: Self-pay | Admitting: Advanced Practice Midwife

## 2019-05-14 VITALS — BP 112/71 | HR 103 | Temp 97.9°F | Wt 244.0 lb

## 2019-05-14 DIAGNOSIS — N898 Other specified noninflammatory disorders of vagina: Secondary | ICD-10-CM

## 2019-05-14 DIAGNOSIS — Z3480 Encounter for supervision of other normal pregnancy, unspecified trimester: Secondary | ICD-10-CM

## 2019-05-14 DIAGNOSIS — O26843 Uterine size-date discrepancy, third trimester: Secondary | ICD-10-CM | POA: Diagnosis not present

## 2019-05-14 DIAGNOSIS — R8290 Unspecified abnormal findings in urine: Secondary | ICD-10-CM

## 2019-05-14 DIAGNOSIS — Z3A28 28 weeks gestation of pregnancy: Secondary | ICD-10-CM | POA: Diagnosis not present

## 2019-05-14 DIAGNOSIS — Z113 Encounter for screening for infections with a predominantly sexual mode of transmission: Secondary | ICD-10-CM | POA: Diagnosis present

## 2019-05-14 LAB — POCT URINALYSIS DIPSTICK OB
Bilirubin, UA: NEGATIVE
Glucose, UA: NEGATIVE
Ketones, UA: NEGATIVE
Nitrite, UA: NEGATIVE
Spec Grav, UA: >=1.03 — AB (ref 1.010–1.025)
Urobilinogen, UA: 0.2 E.U./dL
pH, UA: 6.5 (ref 5.0–8.0)

## 2019-05-14 MED ORDER — METRONIDAZOLE 500 MG PO TABS: 500.0000 mg | ORAL_TABLET | Freq: Two times a day (BID) | ORAL | 0 refills | Status: DC

## 2019-05-14 NOTE — Patient Instructions (Addendum)
If you are in need of transportation to get to and from your appointments in our office.  You can reach Transportation Services by calling [phone] Monday - Friday  7am-6pm.     Fetal Movement Counts Patient Name: ________________________________________________ Patient Due Date: ____________________ What is a fetal movement count?  A fetal movement count is the number of times that you feel your baby move during a certain amount of time. This may also be called a fetal kick count. A fetal movement count is recommended for every pregnant woman. You may be asked to start counting fetal movements as early as week 28 of your pregnancy. Pay attention to when your baby is most active. You may notice your baby's sleep and wake cycles. You may also notice things that make your baby move more. You should do a fetal movement count:  When your baby is normally most active.  At the same time each day. A good time to count movements is while you are resting, after having something to eat and drink. How do I count fetal movements? 1. Find a quiet, comfortable area. Sit, or lie down on your side. 2. Write down the date, the start time and stop time, and the number of movements that you felt between those two times. Take this information with you to your health care visits. 3. Write down your start time when you feel the first movement. 4. Count kicks, flutters, swishes, rolls, and jabs. You should feel at least 10 movements. 5. You may stop counting after you have felt 10 movements, or if you have been counting for 2 hours. Write down the stop time. 6. If you do not feel 10 movements in 2 hours, contact your health care provider for further instructions. Your health care provider may want to do additional tests to assess your baby's well-being. Contact a health care provider if:  You feel fewer than 10 movements in 2 hours.  Your baby is not moving like he or she usually does. Date: ____________  Start time: ____________ Stop time: ____________ Movements: ____________ Date: ____________ Start time: ____________ Stop time: ____________ Movements: ____________ Date: ____________ Start time: ____________ Stop time: ____________ Movements: ____________ Date: ____________ Start time: ____________ Stop time: ____________ Movements: ____________ Date: ____________ Start time: ____________ Stop time: ____________ Movements: ____________ Date: ____________ Start time: ____________ Stop time: ____________ Movements: ____________ Date: ____________ Start time: ____________ Stop time: ____________ Movements: ____________ Date: ____________ Start time: ____________ Stop time: ____________ Movements: ____________ Date: ____________ Start time: ____________ Stop time: ____________ Movements: ____________ This information is not intended to replace advice given to you by your health care provider. Make sure you discuss any questions you have with your health care provider. Document Revised: 10/16/2018 Document Reviewed: 10/16/2018 Elsevier Patient Education  2020 [employer]      Third Trimester of Pregnancy The third trimester is from week 28 through week 40 (months 7 through 9). The third trimester is a time when the unborn baby (fetus) is growing rapidly. At the end of the ninth month, the fetus is about 20 inches in length and weighs 6-10 pounds. Body changes during your third trimester Your body will continue to go through many changes during pregnancy. The changes vary from woman to woman. During the third trimester:  Your weight will continue to increase. You can expect to gain 25-35 pounds (11-16 kg) by the end of the pregnancy.  You may begin to get stretch marks on your hips, abdomen, and breasts.  You may  urinate more often because the fetus is moving lower into your pelvis and pressing on your bladder.  You may develop or continue to have heartburn. This is caused by increased  hormones that slow down muscles in the digestive tract.  You may develop or continue to have constipation because increased hormones slow digestion and cause the muscles that push waste through your intestines to relax.  You may develop hemorrhoids. These are swollen veins (varicose veins) in the rectum that can itch or be painful.  You may develop swollen, bulging veins (varicose veins) in your legs.  You may have increased body aches in the pelvis, back, or thighs. This is due to weight gain and increased hormones that are relaxing your joints.  You may have changes in your hair. These can include thickening of your hair, rapid growth, and changes in texture. Some women also have hair loss during or after pregnancy, or hair that feels dry or thin. Your hair will most likely return to normal after your baby is born.  Your breasts will continue to grow and they will continue to become tender. A yellow fluid (colostrum) may leak from your breasts. This is the first milk you are producing for your baby.  Your belly button may stick out.  You may notice more swelling in your hands, face, or ankles.  You may have increased tingling or numbness in your hands, arms, and legs. The skin on your belly may also feel numb.  You may feel short of breath because of your expanding uterus.  You may have more problems sleeping. This can be caused by the size of your belly, increased need to urinate, and an increase in your body's metabolism.  You may notice the fetus "dropping," or moving lower in your abdomen (lightening).  You may have increased vaginal discharge.  You may notice your joints feel loose and you may have pain around your pelvic bone. What to expect at prenatal visits You will have prenatal exams every 2 weeks until week 36. Then you will have weekly prenatal exams. During a routine prenatal visit:  You will be weighed to make sure you and the baby are growing normally.  Your blood  pressure will be taken.  Your abdomen will be measured to track your baby's growth.  The fetal heartbeat will be listened to.  Any test results from the previous visit will be discussed.  You may have a cervical check near your due date to see if your cervix has softened or thinned (effaced).  You will be tested for Group B streptococcus. This happens between 35 and 37 weeks. Your health care provider may ask you:  What your birth plan is.  How you are feeling.  If you are feeling the baby move.  If you have had any abnormal symptoms, such as leaking fluid, bleeding, severe headaches, or abdominal cramping.  If you are using any tobacco products, including cigarettes, chewing tobacco, and electronic cigarettes.  If you have any questions. Other tests or screenings that may be performed during your third trimester include:  Blood tests that check for low iron levels (anemia).  Fetal testing to check the health, activity level, and growth of the fetus. Testing is done if you have certain medical conditions or if there are problems during the pregnancy.  Nonstress test (NST). This test checks the health of your baby to make sure there are no signs of problems, such as the baby not getting enough oxygen. During this  test, a belt is placed around your belly. The baby is made to move, and its heart rate is monitored during movement. What is false labor? False labor is a condition in which you feel small, irregular tightenings of the muscles in the womb (contractions) that usually go away with rest, changing position, or drinking water. These are called Braxton Hicks contractions. Contractions may last for hours, days, or even weeks before true labor sets in. If contractions come at regular intervals, become more frequent, increase in intensity, or become painful, you should see your health care provider. What are the signs of labor?  Abdominal cramps.  Regular contractions that start  at 10 minutes apart and become stronger and more frequent with time.  Contractions that start on the top of the uterus and spread down to the lower abdomen and back.  Increased pelvic pressure and dull back pain.  A watery or bloody mucus discharge that comes from the vagina.  Leaking of amniotic fluid. This is also known as your "water breaking." It could be a slow trickle or a gush. Let your health care provider know if it has a color or strange odor. If you have any of these signs, call your health care provider right away, even if it is before your due date. Follow these instructions at home: Medicines  Follow your health care provider's instructions regarding medicine use. Specific medicines may be either safe or unsafe to take during pregnancy.  Take a prenatal vitamin that contains at least 600 micrograms (mcg) of folic acid.  If you develop constipation, try taking a stool softener if your health care provider approves. Eating and drinking   Eat a balanced diet that includes fresh fruits and vegetables, whole grains, good sources of protein such as meat, eggs, or tofu, and low-fat dairy. Your health care provider will help you determine the amount of weight gain that is right for you.  Avoid raw meat and uncooked cheese. These carry germs that can cause birth defects in the baby.  If you have low calcium intake from food, talk to your health care provider about whether you should take a daily calcium supplement.  Eat four or five small meals rather than three large meals a day.  Limit foods that are high in fat and processed sugars, such as fried and sweet foods.  To prevent constipation: ? Drink enough fluid to keep your urine clear or pale yellow. ? Eat foods that are high in fiber, such as fresh fruits and vegetables, whole grains, and beans. Activity  Exercise only as directed by your health care provider. Most women can continue their usual exercise routine during  pregnancy. Try to exercise for 30 minutes at least 5 days a week. Stop exercising if you experience uterine contractions.  Avoid heavy lifting.  Do not exercise in extreme heat or humidity, or at high altitudes.  Wear low-heel, comfortable shoes.  Practice good posture.  You may continue to have sex unless your health care provider tells you otherwise. Relieving pain and discomfort  Take frequent breaks and rest with your legs elevated if you have leg cramps or low back pain.  Take warm sitz baths to soothe any pain or discomfort caused by hemorrhoids. Use hemorrhoid cream if your health care provider approves.  Wear a good support bra to prevent discomfort from breast tenderness.  If you develop varicose veins: ? Wear support pantyhose or compression stockings as told by your healthcare provider. ? Elevate your feet for  15 minutes, 3-4 times a day. Prenatal care  Write down your questions. Take them to your prenatal visits.  Keep all your prenatal visits as told by your health care provider. This is important. Safety  Wear your seat belt at all times when driving.  Make a list of emergency phone numbers, including numbers for family, friends, the hospital, and police and fire departments. General instructions  Avoid cat litter boxes and soil used by cats. These carry germs that can cause birth defects in the baby. If you have a cat, ask someone to clean the litter box for you.  Do not travel far distances unless it is absolutely necessary and only with the approval of your health care provider.  Do not use hot tubs, steam rooms, or saunas.  Do not drink alcohol.  Do not use any products that contain nicotine or tobacco, such as cigarettes and e-cigarettes. If you need help quitting, ask your health care provider.  Do not use any medicinal herbs or unprescribed drugs. These chemicals affect the formation and growth of the baby.  Do not douche or use tampons or scented  sanitary pads.  Do not cross your legs for long periods of time.  To prepare for the arrival of your baby: ? Take prenatal classes to understand, practice, and ask questions about labor and delivery. ? Make a trial run to the hospital. ? Visit the hospital and tour the maternity area. ? Arrange for maternity or paternity leave through employers. ? Arrange for family and friends to take care of pets while you are in the hospital. ? Purchase a rear-facing car seat and make sure you know how to install it in your car. ? Pack your hospital bag. ? Prepare the baby's nursery. Make sure to remove all pillows and stuffed animals from the baby's crib to prevent suffocation.  Visit your dentist if you have not gone during your pregnancy. Use a soft toothbrush to brush your teeth and be gentle when you floss. Contact a health care provider if:  You are unsure if you are in labor or if your water has broken.  You become dizzy.  You have mild pelvic cramps, pelvic pressure, or nagging pain in your abdominal area.  You have lower back pain.  You have persistent nausea, vomiting, or diarrhea.  You have an unusual or bad smelling vaginal discharge.  You have pain when you urinate. Get help right away if:  Your water breaks before 37 weeks.  You have regular contractions less than 5 minutes apart before 37 weeks.  You have a fever.  You are leaking fluid from your vagina.  You have spotting or bleeding from your vagina.  You have severe abdominal pain or cramping.  You have rapid weight loss or weight gain.  You have shortness of breath with chest pain.  You notice sudden or extreme swelling of your face, hands, ankles, feet, or legs.  Your baby makes fewer than 10 movements in 2 hours.  You have severe headaches that do not go away when you take medicine.  You have vision changes. Summary  The third trimester is from week 28 through week 40, months 7 through 9. The third  trimester is a time when the unborn baby (fetus) is growing rapidly.  During the third trimester, your discomfort may increase as you and your baby continue to gain weight. You may have abdominal, leg, and back pain, sleeping problems, and an increased need to urinate.  During  the third trimester your breasts will keep growing and they will continue to become tender. A yellow fluid (colostrum) may leak from your breasts. This is the first milk you are producing for your baby.  False labor is a condition in which you feel small, irregular tightenings of the muscles in the womb (contractions) that eventually go away. These are called Braxton Hicks contractions. Contractions may last for hours, days, or even weeks before true labor sets in.  Signs of labor can include: abdominal cramps; regular contractions that start at 10 minutes apart and become stronger and more frequent with time; watery or bloody mucus discharge that comes from the vagina; increased pelvic pressure and dull back pain; and leaking of amniotic fluid. This information is not intended to replace advice given to you by your health care provider. Make sure you discuss any questions you have with your health care provider. Document Revised: 06/19/2018 Document Reviewed: 04/03/2016 Elsevier Patient Education  2020 [employer] Inc[employer]  Iron-Rich Diet  Iron is a mineral that helps your body to produce hemoglobin. Hemoglobin is a protein in red blood cells that carries oxygen to your body's tissues. Eating too little iron may cause you to feel weak and tired, and it can increase your risk of infection. Iron is naturally found in many foods, and many foods have iron added to them (iron-fortified foods). You may need to follow an iron-rich diet if you do not have enough iron in your body due to certain medical conditions. The amount of iron that you need each day depends on your age, your sex, and any medical conditions you have. Follow  instructions from your health care provider or a diet and nutrition specialist (dietitian) about how much iron you should eat each day. What are tips for following this plan? Reading food labels  Check food labels to see how many milligrams (mg) of iron are in each serving. Cooking  Cook foods in pots and pans that are made from iron.  Take these steps to make it easier for your body to absorb iron from certain foods: ? Soak beans overnight before cooking. ? Soak whole grains overnight and drain them before using. ? Ferment flours before baking, such as by using yeast in bread dough. Meal planning  When you eat foods that contain iron, you should eat them with foods that are high in vitamin C. These include oranges, peppers, tomatoes, potatoes, and mango. Vitamin C helps your body to absorb iron. General information  Take iron supplements only as told by your health care provider. An overdose of iron can be life-threatening. If you were prescribed iron supplements, take them with orange juice or a vitamin C supplement.  When you eat iron-fortified foods or take an iron supplement, you should also eat foods that naturally contain iron, such as meat, poultry, and fish. Eating naturally iron-rich foods helps your body to absorb the iron that is added to other foods or contained in a supplement.  Certain foods and drinks prevent your body from absorbing iron properly. Avoid eating these foods in the same meal as iron-rich foods or with iron supplements. These foods include: ? Coffee, black tea, and red wine. ? Milk, dairy products, and foods that are high in calcium. ? Beans and soybeans. ? Whole grains. What foods should I eat? Fruits Prunes. Raisins. Eat fruits high in vitamin C, such as oranges, grapefruits, and strawberries, alongside iron-rich foods. Vegetables Spinach (cooked). Green peas. Broccoli. Fermented vegetables. Eat vegetables high in  vitamin C, such as leafy greens,  potatoes, bell peppers, and tomatoes, alongside iron-rich foods. Grains Iron-fortified breakfast cereal. Iron-fortified whole-wheat bread. Enriched rice. Sprouted grains. Meats and other proteins Beef liver. Oysters. Beef. Shrimp. [place]. Chicken. Tuna. Sardines. Chickpeas. Nuts. Tofu. Pumpkin seeds. Beverages Tomato juice. Fresh orange juice. Prune juice. Hibiscus tea. Fortified instant breakfast shakes. Sweets and desserts Blackstrap molasses. Seasonings and condiments Tahini. Fermented soy sauce. Other foods Wheat germ. The items listed above may not be a complete list of recommended foods and beverages. Contact a dietitian for more information. What foods should I avoid? Grains Whole grains. Bran cereal. Bran flour. Oats. Meats and other proteins Soybeans. Products made from soy protein. Black beans. Lentils. Mung beans. Split peas. Dairy Milk. Cream. Cheese. Yogurt. Cottage cheese. Beverages Coffee. Black tea. Red wine. Sweets and desserts Cocoa. Chocolate. Ice cream. Other foods Basil. Oregano. Large amounts of parsley. The items listed above may not be a complete list of foods and beverages to avoid. Contact a dietitian for more information. Summary  Iron is a mineral that helps your body to produce hemoglobin. Hemoglobin is a protein in red blood cells that carries oxygen to your body's tissues.  Iron is naturally found in many foods, and many foods have iron added to them (iron-fortified foods).  When you eat foods that contain iron, you should eat them with foods that are high in vitamin C. Vitamin C helps your body to absorb iron.  Certain foods and drinks prevent your body from absorbing iron properly, such as whole grains and dairy products. You should avoid eating these foods in the same meal as iron-rich foods or with iron supplements. This information is not intended to replace advice given to you by your health care provider. Make sure you discuss any questions  you have with your health care provider. Document Revised: 02/08/2017 Document Reviewed: 01/22/2017 Elsevier Patient Education  2020 [employer] Inc.   Alternative Vaginitis Therapies  1) soak in tub of warm water waist high with 1/2 cup of baking soda in water for ~ 20 mins. 2) soak 3 tampons in 1 tablespoon of fractionated (liquid form) coconut oil with 10 drops of Melaleuca (Tea Tree) essential oil, insert 1 saturated tampon vaginally at bedtime x 3 days. Both options are to be done after sexual intercourse, menses and when suspects BV and/or yeast infection. Advised that these alternatives will not replace the need to be evaluated, if sx's persist. You will need to seek care at an OB/GYN provider.  GO WHITE: Soap: UNSCENTED Dove (white box light green writing) Laundry detergent (underwear)- Dreft or [employer] unscented WHITE 100% cotton panties (NOT just cotton crouch) Sanitary napkin/panty liners: UNSCENTED.  If it doesn't SAY unscented it can have a scent/perfume    NO PERFUMES OR LOTIONS OR POTIONS in the vulvar area (may use regular [place]) Condoms: hypoallergenic only. Non dyed (no color) Toilet papers: white only Wash clothes: use a separate wash cloth. WHITE.  Wash in [place].   You can purchase Tea Tree Oil locally at:  [facility] [address] [place], [place] [address] [phone]  [facility] [address] [place], [place] [address] [phone]

## 2019-05-14 NOTE — Progress Notes (Signed)
LOW-RISK PREGNANCY OFFICE VISIT Patient name: Bonnie Cobb MRN [id number]  Date of birth: [birth year] Chief Complaint:   Routine Prenatal Visit  History of Present Illness:   Bonnie Cobb is a 24 y.o. G73P0010 female at [redacted]w[redacted]d with an Estimated Date of Delivery: 08/03/19 being seen today for ongoing management of a low-risk pregnancy.  Today she reports yellow vaginal discharge that has an odor that "wakes me up out of my sleep." She tried the vaginal cream that was Rx'd, but "the burning sensation it gave was too much". Contractions: Not present. Vag. Bleeding: None.  Movement: Present. denies leaking of fluid. Self-swabs for TOC collected today. Review of Systems:   Pertinent items are noted in HPI Denies abnormal vaginal discharge w/ itching/odor/irritation, headaches, visual changes, shortness of breath, chest pain, abdominal pain, severe nausea/vomiting, or problems with urination or bowel movements unless otherwise stated above. Pertinent History Reviewed:  Reviewed past medical,surgical, social, obstetrical and family history.  Reviewed problem list, medications and allergies. Physical Assessment:   Vitals:   05/14/19 0817  BP: 112/71  Pulse: (!) 103  Temp: 97.9 F (36.6 C)  Weight: 244 lb (110.7 kg)  Body mass index is 42.54 kg/m.        Physical Examination:   General appearance: Well appearing, and in no distress  Mental status: Alert, oriented to person, place, and time  Skin: Warm & dry  Cardiovascular: Normal heart rate noted  Respiratory: Normal respiratory effort, no distress  Abdomen: Soft, gravid, nontender  Pelvic: Cervical exam deferred         Extremities: Edema: None  Fetal Status: Fetal Heart Rate (bpm): 141 Fundal Height: 32 cm Movement: Present Presentation: Undeterminable  Results for orders placed or performed in visit on 05/14/19 (from the past 24 hour(s))  POC Urinalysis Dipstick OB   Collection Time: 05/14/19  8:59 AM  Result Value Ref  Range   Color, UA Yellow    Clarity, UA Cloudy    Glucose, UA Negative Negative   Bilirubin, UA Negative    Ketones, UA Negative    Spec Grav, UA >=1.030 (A) 1.010 - 1.025   Blood, UA Trace-Intact    pH, UA 6.5 5.0 - 8.0   POC,PROTEIN,UA Small (1+) Negative, Trace, Small (1+), Moderate (2+), Large (3+), 4+   Urobilinogen, UA 0.2 0.2 or 1.0 E.U./dL   Nitrite, UA Negative    Leukocytes, UA Large (3+) (A) Negative   Appearance Cloudy    Odor yes     Assessment & Plan:  1) Low-risk pregnancy G2P0010 at [redacted]w[redacted]d with an Estimated Date of Delivery: 08/03/19   2) Supervision of other normal pregnancy, antepartum  - Glucose Tolerance, 2 Hours w/1 Hour,  - HIV Antibody (routine testing w rflx),  - RPR,  - CBC,  - Antibody screen -- ordered per consult with Dr. Macon Large - Anticipatory guidance given for upcoming appt via My Chart  3) Screen for STD (sexually transmitted disease)  - Cervicovaginal ancillary only( [place])  4) Cloudy urine  - Culture, OB Urine,  - POC Urinalysis Dipstick OB  5) Uterine size-date discrepancy in third trimester  - [place] MFM OB FOLLOW UP  6) Vaginal odor  - Rx for metroNIDAZOLE (FLAGYL) 500 MG tablet     Meds: No orders of the defined types were placed in this encounter.  Labs/procedures today: TOC, 2 hr GTT, 3rd trimester labs  Plan:  Continue routine obstetrical care   Reviewed: Preterm labor symptoms and general obstetric precautions  including but not limited to vaginal bleeding, contractions, leaking of fluid and fetal movement were reviewed in detail with the patient.  All questions were answered. Has home bp cuff. Check bp weekly, let us know if >140/90.   Follow-up: Return in about 4 weeks (around 06/11/2019) for Return OB - My Chart video.  Orders Placed This Encounter  Procedures  . Culture, OB Urine  . [place] MFM OB FOLLOW UP  . Glucose Tolerance, 2 Hours w/1 Hour  . HIV Antibody (routine testing w rflx)  . RPR  . CBC  . POC Urinalysis  Dipstick OB  . Antibody screen   Laury Deep MSN, CNM 05/14/2019 9:18 AM

## 2019-05-15 ENCOUNTER — Other Ambulatory Visit: Payer: Self-pay | Admitting: Obstetrics and Gynecology

## 2019-05-15 DIAGNOSIS — D563 Thalassemia minor: Secondary | ICD-10-CM | POA: Insufficient documentation

## 2019-05-15 DIAGNOSIS — B379 Candidiasis, unspecified: Secondary | ICD-10-CM

## 2019-05-15 DIAGNOSIS — O23593 Infection of other part of genital tract in pregnancy, third trimester: Secondary | ICD-10-CM

## 2019-05-15 DIAGNOSIS — A5901 Trichomonal vulvovaginitis: Secondary | ICD-10-CM

## 2019-05-15 LAB — CBC
Hematocrit: 29.9 % — ABNORMAL LOW (ref 34.0–46.6)
Hemoglobin: 9.9 g/dL — ABNORMAL LOW (ref 11.1–15.9)
MCH: 24.8 pg — ABNORMAL LOW (ref 26.6–33.0)
MCHC: 33.1 g/dL (ref 31.5–35.7)
MCV: 75 fL — ABNORMAL LOW (ref 79–97)
Platelets: 245 10*3/uL (ref 150–450)
RBC: 3.99 x10E6/uL (ref 3.77–5.28)
RDW: 14.5 % (ref 11.7–15.4)
WBC: 6.8 10*3/uL (ref 3.4–10.8)

## 2019-05-15 LAB — AB SCR+ANTIBODY ID: Antibody Screen: POSITIVE — AB

## 2019-05-15 LAB — CERVICOVAGINAL ANCILLARY ONLY
Bacterial Vaginitis (gardnerella): POSITIVE — AB
Candida Glabrata: NEGATIVE
Candida Vaginitis: POSITIVE — AB
Chlamydia: NEGATIVE
Comment: NEGATIVE
Comment: NEGATIVE
Comment: NEGATIVE
Comment: NEGATIVE
Comment: NEGATIVE
Comment: NORMAL
Neisseria Gonorrhea: NEGATIVE
Trichomonas: POSITIVE — AB

## 2019-05-15 LAB — GLUCOSE TOLERANCE, 2 HOURS W/ 1HR
Glucose, 1 hour: 144 mg/dL (ref 65–179)
Glucose, 2 hour: 111 mg/dL (ref 65–152)
Glucose, Fasting: 91 mg/dL (ref 65–91)

## 2019-05-15 LAB — ANTIBODY SCREEN

## 2019-05-15 LAB — HIV ANTIBODY (ROUTINE TESTING W REFLEX): HIV Screen 4th Generation wRfx: NONREACTIVE

## 2019-05-15 LAB — RPR: RPR Ser Ql: NONREACTIVE

## 2019-05-15 MED ORDER — FLUCONAZOLE 150 MG PO TABS: 150.0000 mg | ORAL_TABLET | ORAL | 1 refills | Status: DC

## 2019-05-15 NOTE — Progress Notes (Signed)
Pt notified via My Chart  Raelyn Mora, CNM

## 2019-05-17 LAB — URINE CULTURE, OB REFLEX

## 2019-05-17 LAB — CULTURE, OB URINE

## 2019-06-03 ENCOUNTER — Other Ambulatory Visit: Payer: Self-pay

## 2019-06-03 ENCOUNTER — Inpatient Hospital Stay (HOSPITAL_COMMUNITY)
Admission: AD | Admit: 2019-06-03 | Discharge: 2019-06-03 | Disposition: A | Discharge status: Home / Self Care | Payer: Medicaid Other | Attending: Obstetrics and Gynecology | Admitting: Obstetrics and Gynecology

## 2019-06-03 ENCOUNTER — Encounter (HOSPITAL_COMMUNITY): Payer: Self-pay | Admitting: Obstetrics and Gynecology

## 2019-06-03 DIAGNOSIS — O99891 Other specified diseases and conditions complicating pregnancy: Secondary | ICD-10-CM

## 2019-06-03 DIAGNOSIS — Z87891 Personal history of nicotine dependence: Secondary | ICD-10-CM | POA: Insufficient documentation

## 2019-06-03 DIAGNOSIS — R102 Pelvic and perineal pain: Secondary | ICD-10-CM

## 2019-06-03 DIAGNOSIS — O26893 Other specified pregnancy related conditions, third trimester: Secondary | ICD-10-CM

## 2019-06-03 DIAGNOSIS — Z3A31 31 weeks gestation of pregnancy: Secondary | ICD-10-CM

## 2019-06-03 DIAGNOSIS — M7918 Myalgia, other site: Secondary | ICD-10-CM

## 2019-06-03 LAB — URINALYSIS, ROUTINE W REFLEX MICROSCOPIC
Bilirubin Urine: NEGATIVE
Glucose, UA: NEGATIVE mg/dL
Hgb urine dipstick: NEGATIVE
Ketones, ur: NEGATIVE mg/dL
Leukocytes,Ua: NEGATIVE
Nitrite: NEGATIVE
Protein, ur: NEGATIVE mg/dL
Specific Gravity, Urine: 1.008 (ref 1.005–1.030)
pH: 7 (ref 5.0–8.0)

## 2019-06-03 LAB — WET PREP, GENITAL
Clue Cells Wet Prep HPF POC: NONE SEEN
Sperm: NONE SEEN
Trich, Wet Prep: NONE SEEN
Yeast Wet Prep HPF POC: NONE SEEN

## 2019-06-03 MED ORDER — COMFORT FIT MATERNITY SUPP MED MISC: 1.0000 | Freq: Every day | 0 refills | Status: DC

## 2019-06-03 MED ORDER — CYCLOBENZAPRINE HCL 10 MG PO TABS: 5.0000 mg | ORAL_TABLET | Freq: Three times a day (TID) | ORAL | 1 refills | Status: DC | PRN

## 2019-06-03 NOTE — Discharge Instructions (Signed)
Pelvic pain in pregnancy/Pubic Symphysis Pain ° °Some women develop pelvic pain in pregnancy. This is sometimes called pregnancy-related pelvic girdle pain (PGP) or symphysis pubis dysfunction (SPD). °PGP is a collection of uncomfortable symptoms caused by a stiffness of your pelvic joints or the joints moving unevenly at either the back or front of your pelvis. ° °Symptoms of PGP °PGP is not harmful to your baby, but it can be painful and make it hard to get around.  °Women with PGP may feel pain:  °• over the pubic bone at the front in the center, roughly level with your hips °• across 1 or both sides of your lower back  °• in the area between your vagina and anus (perineum)  °• spreading to your thighs °Some women feel or hear a clicking or grinding in the pelvic area.  °The pain can be worse when you're:  °• walking  °• going up or down stairs °• standing on 1 leg (for example, when you're getting dressed)  °• turning over in bed  °• moving your legs apart (for example, when you get out of a car) °Most women with PGP can have a vaginal birth. ° °Non-urgent advice: Call your midwife or doctor if you have pelvic pain and: °• it's hard for you to move around °• it hurts to get out of a car or turn over in bed °• it's painful going up or down stairs °These can be signs of pregnancy-related pelvic girdle pain. ° °Treatments for PGP °Getting diagnosed as early as possible can help keep pain to a minimum and avoid long-term discomfort.  °You can ask your midwife for a referral to a physiotherapist who specializes in obstetric pelvic joint problems.  °Physiotherapy aims to relieve or ease pain, improve muscle function, and improve your pelvic joint position and stability.  °This may include:  °• manual therapy to make sure the joints of your pelvis, hip and spine move normally  °• exercises to strengthen your pelvic floor, stomach, back and hip muscles  °• exercises in water  °• advice and suggestions, including  positions for labor and birth, looking after your baby and positions for sex  °• pain relief, such as TENS °• equipment, if necessary, such as crutches or pelvic support belts °These problems tend not to get completely better until the baby is born, but treatment from an experienced practitioner can improve the symptoms during pregnancy.  °You can contact the [employer]vic Partnership (www.pelvicpartnership.org.uk) for information and support. ° °Coping with pelvic pain in pregnancy °Your physiotherapist may recommend a pelvic support belt to help ease your pain, or crutches to help you get around.  °It can help to plan your day so you avoid activities that cause you pain. For example, do not go up or down stairs more often than you have to.  °The Pelvic, Obstetric & Gynaecological Physiotherapy (POGP) network also offers this advice:  °• be as active as possible within your pain limits, and avoid activities that make the pain worse  °• rest when you can  °• get help with household chores from your partner, family and friends  °• wear flat, supportive shoes  °• sit down to get dressed - for example, do not stand on 1 leg when putting on jeans  °• keep your knees together when getting in and out of the car - a plastic bag on the seat can help you swivel  °• sleep in a comfortable position - for example, on   your side with a pillow between your legs  °• try different ways of turning over in bed - for example, turning over with your knees together and squeezing your buttocks  °• take the stairs 1 at a time, or go upstairs backwards or on your bottom  °• if you're using crutches, have a small backpack to carry things in  °• if you want to have sex, consider different positions, such as kneeling on all fours  °POGP suggests that you avoid:  °• standing on 1 leg  °• bending and twisting to lift, or carrying a baby on 1 hip  °• crossing your legs  °• sitting on the floor, or sitting twisted  °• sitting or standing for long periods   °• lifting heavy weights, such as shopping bags, wet washing or a toddler  °• vacuuming  °• pushing heavy objects, such as a supermarket trolley  °• carrying anything in only 1 hand (try using a small backpack)  °The physiotherapist should be able to provide advice on coping with the emotional impact of living with chronic pain, such as using relaxation techniques. If your pain is causing you considerable distress, then you should let your doctor or midwife know. You may require additional treatment. °Download the POGP leaflet Pregnancy-related pelvic girdle pain for mothers-to-be and new mothers (https://pogp.csp.org.uk/publications). °You can get more information on managing everyday activities with PGP from the [employer]vic Partnership (www.pelvicpartnership.org.uk) ° °Labor and birth with pelvic pain °Many women with pelvic pain in pregnancy can have a normal vaginal birth.  °Plan ahead and talk about your birth plan with your birth partner and midwife.  °Write in your birth plan that you have PGP, so the people supporting you during labor and birth will be aware of your condition.  °Think about birth positions that are the most comfortable for you, and write them in your birth plan.  °Being in water can take the weight off your joints and allow you to move more easily, so you might want to think about having a water birth. You can discuss this with your midwife.  ° °Your 'pain-free range of movement' °If you have pain when you open your legs, find out your pain-free range of movement.  °To do this, lie on your back or sit on the edge of a chair and open your legs as far as you can without pain. °Your partner or midwife can measure the distance between your knees with a tape measure. This is your pain-free range.  °To protect your joints, try not to open your legs wider than this during labor and birth.  °This is particularly important if you have an epidural for pain relief in labor, as you won't be feeling the pain  that warns you that you're separating your legs too far.  °If you have an epidural, make sure your midwife and birth partner are aware of your pain-free range of movement of your legs. °When pushing in the second stage of labor, you may find it beneficial to lie on one side.  °This prevents your legs from being separated too much. You can stay in this position for the birth of your baby, if you wish. °Sometimes it might be necessary to open your legs wider than your pain-free range to deliver your baby safely, particularly if you have an assisted delivery (for example, with the vacuum or foreps). °Even in this case, it's possible to limit the separation of your legs. Make sure your midwife and doctor   are aware that you have PGP.  If you go beyond your pain-free range, your physiotherapist should assess you after the birth.  Take extra care until they have assessed and advised you.  Who gets pelvic pain in pregnancy? It's estimated that PGP affects up to 1 in 5 pregnant women to some degree.  It's not known exactly why pelvic pain affects some women, but it's thought to be linked to a number of issues, including previous damage to the pelvis, pelvic joints moving unevenly, and the weight or position of the baby.  Factors that may make a woman more likely to develop PGP include:  . a history of lower back or pelvic girdle pain  . previous injury to the pelvis (for example, from a fall or accident) . having PGP in a previous pregnancy  . a physically demanding job . being overweight . having a multiple birth pregnancy  healthtalk.org has interviews with women talking about their experiences of pelvic pain in pregnancy. Read more about coping with common pregnancy problems (www.nhs.uk/conditions/pregnancy-and-baby) including nausea, heartburn, tiredness and constipation.  Talk with your doctor or midwife for more information.     PREGNANCY SUPPORT BELT: You are not alone, Seventy-five percent of  women have some sort of abdominal or back pain at some point in their pregnancy. Your baby is growing at a fast pace, which means that your whole body is rapidly trying to adjust to the changes. As your uterus grows, your back may start feeling a bit under stress and this can result in back or abdominal pain that can go from mild, and therefore bearable, to severe pains that will not allow you to sit or lay down comfortably, When it comes to dealing with pregnancy-related pains and cramps, some pregnant women usually prefer natural remedies, which the market is filled with nowadays. For example, wearing a pregnancy support belt can help ease and lessen your discomfort and pain. WHAT ARE THE BENEFITS OF WEARING A PREGNANCY SUPPORT BELT? A pregnancy support belt provides support to the lower portion of the belly taking some of the weight of the growing uterus and distributing to the other parts of your body. It is designed make you comfortable and gives you extra support. Over the years, the pregnancy apparel market has been studying the needs and wants of pregnant women and they have come up with the most comfortable pregnancy support belts that woman could ever ask for. In fact, you will no longer have to wear a stretched-out or bulky pregnancy belt that is visible underneath your clothes and makes you feel even more uncomfortable. Nowadays, a pregnancy support belt is made of comfortable and stretchy materials that will not irritate your skin but will actually make you feel at ease and you will not even notice you are wearing it. They are easy to put on and adjust during the day and can be worn at night for additional support.  BENEFITS: . Relives Back pain . Relieves Abdominal Muscle and Leg Pain . Stabilizes the Pelvic Ring . Offers a Cushioned Abdominal Lift Pad . Relieves pressure on the Sciatic Nerve Within Minutes .  WHERE TO GET YOUR PREGNANCY BELT: [employer] [phone] @[phone]  [address] [place], [place]
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
## 2019-06-03 NOTE — MAU Note (Signed)
Bonnie Cobb is a 24 y.o. at [redacted]w[redacted]d here in MAU reporting: pelvic pain since last night. Has been Rx flexeril and it helps a little bit but it is not helping enough. Pain has been constant since last night. States she is having discharge, no LOF, no bleeding.  Onset of complaint: last night  Pain score: 9/10  Vitals:   06/03/19 1619  BP: 113/74  Pulse: (!) 109  Resp: 16  Temp: 98.2 F (36.8 C)  SpO2: 100%     FHT: +FM  Lab orders placed from triage: UA

## 2019-06-04 LAB — GC/CHLAMYDIA PROBE AMP (~~LOC~~) NOT AT ARMC
Chlamydia: NEGATIVE
Comment: NEGATIVE
Comment: NORMAL
Neisseria Gonorrhea: NEGATIVE

## 2019-06-10 NOTE — Progress Notes (Deleted)
   MY CHART VIDEO VIRTUAL OBSTETRICS VISIT ENCOUNTER NOTE  I connected with Bonnie Cobb on 06/10/19 at  2:50 PM EDT by My Chart video at home and verified that I am speaking with the correct person using two identifiers.   I discussed the limitations, risks, security and privacy concerns of performing an evaluation and management service by My Chart video and the availability of in person appointments. I also discussed with the patient that there may be a patient responsible charge related to this service. The patient expressed understanding and agreed to proceed.  Subjective:  Bonnie Cobb is a 24 y.o. G2P0010 at [redacted]w[redacted]d being followed for ongoing prenatal care.  She is currently monitored for the following issues for this {Blank single:19197::"high-risk","low-risk"} pregnancy and has Supervision of other normal pregnancy, antepartum; Obesity complicating pregnancy, childbirth, or puerperium, antepartum; History of depression; Chlamydia infection during pregnancy; Maternal gonorrhea in second trimester; Anemia affecting pregnancy in second trimester; GBS bacteriuria; Maternal atypical antibody complicating pregnancy; Trichomonal vaginitis during pregnancy in second trimester; and Alpha thalassemia silent carrier on their problem list.  Patient reports {[id number]}. Reports fetal movement. Denies any contractions, bleeding or leaking of fluid.   The following portions of the patient's history were reviewed and updated as appropriate: allergies, current medications, past family history, past medical history, past social history, past surgical history and problem list.   Objective:   General:  Alert, oriented and cooperative.   Mental Status: Normal mood and affect perceived. Normal judgment and thought content.  Rest of physical exam deferred due to type of encounter  LMP 10/27/2018  **Done by patient's own at home BP cuff and scale  Assessment and Plan:  Pregnancy: G2P0010 at [redacted]w[redacted]d  1.  Supervision of other normal pregnancy, antepartum ***  {Blank single:19197::"Term","Preterm"} labor symptoms and general obstetric precautions including but not limited to vaginal bleeding, contractions, leaking of fluid and fetal movement were reviewed in detail with the patient.  I discussed the assessment and treatment plan with the patient. The patient was provided an opportunity to ask questions and all were answered. The patient agreed with the plan and demonstrated an understanding of the instructions. The patient was advised to call back or seek an in-person office evaluation/go to MAU at [facility] for any urgent or concerning symptoms. Please refer to After Visit Summary for other counseling recommendations.   I provided *** minutes of non-face-to-face time during this encounter. There was *** minutes of chart review time spent prior to this encounter. Total time spent = *** minutes.  No follow-ups on file.  Future Appointments  Date Time Provider Department Center  06/11/2019  2:50 PM Raelyn Mora, CNM [facility] None    Raelyn Mora, CNM [facility] [employer], [facility]

## 2019-06-11 ENCOUNTER — Telehealth: Payer: Self-pay | Admitting: General Practice

## 2019-06-11 ENCOUNTER — Telehealth: Payer: Medicaid Other | Admitting: Obstetrics and Gynecology

## 2019-06-11 ENCOUNTER — Encounter: Payer: Self-pay | Admitting: Student

## 2019-06-11 NOTE — Telephone Encounter (Signed)
Called patient to check in for virtual visit and pt did not answer.  Unable to leave a voicemail due to VM not setup.

## 2019-06-19 ENCOUNTER — Ambulatory Visit (HOSPITAL_COMMUNITY): Payer: Medicaid Other | Admitting: *Deleted

## 2019-06-19 ENCOUNTER — Ambulatory Visit (HOSPITAL_COMMUNITY)
Admission: RE | Admit: 2019-06-19 | Discharge: 2019-06-19 | Disposition: A | Discharge status: Home / Self Care | Payer: Medicaid Other | Source: Ambulatory Visit | Attending: Obstetrics and Gynecology | Admitting: Obstetrics and Gynecology

## 2019-06-19 ENCOUNTER — Encounter (HOSPITAL_COMMUNITY): Payer: Self-pay

## 2019-06-19 ENCOUNTER — Other Ambulatory Visit: Payer: Self-pay

## 2019-06-19 DIAGNOSIS — O0933 Supervision of pregnancy with insufficient antenatal care, third trimester: Secondary | ICD-10-CM

## 2019-06-19 DIAGNOSIS — Z3A33 33 weeks gestation of pregnancy: Secondary | ICD-10-CM

## 2019-06-19 DIAGNOSIS — O26843 Uterine size-date discrepancy, third trimester: Secondary | ICD-10-CM | POA: Insufficient documentation

## 2019-06-19 DIAGNOSIS — A5901 Trichomonal vulvovaginitis: Secondary | ICD-10-CM | POA: Diagnosis present

## 2019-06-19 DIAGNOSIS — O2693 Pregnancy related conditions, unspecified, third trimester: Secondary | ICD-10-CM

## 2019-06-19 DIAGNOSIS — Z362 Encounter for other antenatal screening follow-up: Secondary | ICD-10-CM

## 2019-06-19 DIAGNOSIS — O99213 Obesity complicating pregnancy, third trimester: Secondary | ICD-10-CM | POA: Diagnosis not present

## 2019-06-19 DIAGNOSIS — Z3480 Encounter for supervision of other normal pregnancy, unspecified trimester: Secondary | ICD-10-CM | POA: Diagnosis present

## 2019-06-19 DIAGNOSIS — O23592 Infection of other part of genital tract in pregnancy, second trimester: Secondary | ICD-10-CM | POA: Diagnosis present

## 2019-06-19 DIAGNOSIS — Z148 Genetic carrier of other disease: Secondary | ICD-10-CM

## 2019-06-24 ENCOUNTER — Telehealth: Payer: Medicaid Other | Admitting: Student

## 2019-06-24 ENCOUNTER — Encounter: Payer: Self-pay | Admitting: Student

## 2019-07-08 ENCOUNTER — Telehealth: Payer: Medicaid Other | Admitting: Women's Health

## 2019-07-09 ENCOUNTER — Encounter: Payer: Self-pay | Admitting: General Practice

## 2019-07-09 ENCOUNTER — Other Ambulatory Visit (HOSPITAL_COMMUNITY)
Admission: RE | Admit: 2019-07-09 | Discharge: 2019-07-09 | Disposition: A | Discharge status: Home / Self Care | Payer: Medicaid Other | Source: Ambulatory Visit | Attending: Obstetrics and Gynecology | Admitting: Obstetrics and Gynecology

## 2019-07-09 ENCOUNTER — Ambulatory Visit (INDEPENDENT_AMBULATORY_CARE_PROVIDER_SITE_OTHER): Payer: Medicaid Other | Admitting: Obstetrics and Gynecology

## 2019-07-09 ENCOUNTER — Other Ambulatory Visit: Payer: Self-pay

## 2019-07-09 VITALS — BP 115/74 | HR 111 | Temp 98.3°F | Wt 264.0 lb

## 2019-07-09 DIAGNOSIS — O26893 Other specified pregnancy related conditions, third trimester: Secondary | ICD-10-CM

## 2019-07-09 DIAGNOSIS — Z124 Encounter for screening for malignant neoplasm of cervix: Secondary | ICD-10-CM | POA: Insufficient documentation

## 2019-07-09 DIAGNOSIS — N751 Abscess of Bartholin's gland: Secondary | ICD-10-CM

## 2019-07-09 DIAGNOSIS — N898 Other specified noninflammatory disorders of vagina: Secondary | ICD-10-CM | POA: Insufficient documentation

## 2019-07-09 DIAGNOSIS — R8290 Unspecified abnormal findings in urine: Secondary | ICD-10-CM

## 2019-07-09 DIAGNOSIS — Z3480 Encounter for supervision of other normal pregnancy, unspecified trimester: Secondary | ICD-10-CM | POA: Diagnosis present

## 2019-07-09 DIAGNOSIS — O26899 Other specified pregnancy related conditions, unspecified trimester: Secondary | ICD-10-CM | POA: Diagnosis present

## 2019-07-09 DIAGNOSIS — Z3A36 36 weeks gestation of pregnancy: Secondary | ICD-10-CM

## 2019-07-09 LAB — POCT URINALYSIS DIPSTICK OB
Bilirubin, UA: NEGATIVE
Blood, UA: NEGATIVE
Glucose, UA: NEGATIVE
Ketones, UA: NEGATIVE
Nitrite, UA: NEGATIVE
Spec Grav, UA: 1.02 (ref 1.010–1.025)
Urobilinogen, UA: 0.2 E.U./dL
pH, UA: 7.5 (ref 5.0–8.0)

## 2019-07-09 MED ORDER — CLINDAMYCIN HCL 300 MG PO CAPS: 600.0000 mg | ORAL_CAPSULE | Freq: Three times a day (TID) | ORAL | 0 refills | Status: AC

## 2019-07-09 NOTE — Patient Instructions (Signed)
Bartholin's Cyst  A Bartholin's cyst is a fluid-filled sac that forms on a Bartholin's gland. Bartholin's glands are small glands in the folds of skin around the opening of the vagina (labia). This type of cyst causes a bulge or lump near the lower opening of the vagina. If you have a cyst that is small and not infected, you may be able to take care of it at home. If your cyst gets infected, it may cause pain and your doctor may need to drain it. An infected Bartholin's cyst is called a Bartholin's abscess. Follow these instructions at home: Medicines  Take over-the-counter and prescription medicines only as told by your doctor.  If you were prescribed an antibiotic medicine, take it as told by your doctor. Do not stop taking the antibiotic even if you start to feel better. Managing pain and swelling  Try sitz baths to help with pain and swelling. A sitz bath is a warm water bath in which the water only comes up to your hips and should cover your buttocks. You may take sitz baths a few times a day.  Put heat on the affected area as often as needed. Use the heat source that your doctor recommends, such as a moist heat pack or a heating pad. ? Place a towel between your skin and the heat source. ? Leave the heat on for 20-30 minutes. ? Remove the heat if your skin turns bright red. This is especially important if you cannot feel pain, heat, or cold. You may have a greater risk of getting burned. General instructions  If your cyst or abscess was drained: ? Follow instructions from your doctor about how to take care of your wound. ? Use feminine pads to absorb any fluid.  Do not push on or squeeze your cyst.  Do not have sex until the cyst has gone away or your wound from drainage has healed.  Take these steps to help prevent a Bartholin's cyst from returning, and to prevent other Bartholin's cysts from forming: ? Take a bath or shower once a day. Clean your vaginal area with mild soap and  water when you bathe. ? Practice safe sex to prevent STIs (sexually transmitted infections). Talk with your doctor about how to prevent STIs and which forms of birth control (contraception) may be best for you.  Keep all follow-up visits as told by your doctor. This is important. Contact a doctor if:  You have a fever.  You get redness, swelling, or pain around your cyst.  You have fluid, blood, pus, or a bad smell coming from your cyst.  You have a cyst that gets larger or comes back. Summary  A Bartholin's cyst is a fluid-filled sac that forms on a Bartholin's gland. These small glands are found in the folds of skin around the opening of the vagina (labia).  This type of cyst causes a bulge or lump near the lower opening of the vagina. An infected Bartholin's cyst is called a Bartholin's abscess.  Try sitz baths a few times a day to help with pain and swelling.  Do not push on or squeeze your cyst. This information is not intended to replace advice given to you by your health care provider. Make sure you discuss any questions you have with your health care provider. Document Revised: 12/19/2017 Document Reviewed: 11/28/2016 Elsevier Patient Education  2020 [employer]  

## 2019-07-09 NOTE — Progress Notes (Addendum)
LOW-RISK PREGNANCY OFFICE VISIT Patient name: Bonnie Cobb MRN [id number]  Date of birth: [birth year] Chief Complaint:   Routine Prenatal Visit  History of Present Illness:   Bonnie Cobb is a 24 y.o. G31P0010 female at [redacted]w[redacted]d with an Estimated Date of Delivery: 08/03/19 being seen today for ongoing management of a low-risk pregnancy.  Today she reports backache, abnormal urine odor and vaginal pain. Contractions: Irregular. Vag. Bleeding: None.  Movement: Present. denies leaking of fluid. Review of Systems:   Pertinent items are noted in HPI Denies abnormal vaginal discharge w/ itching/odor/irritation, headaches, visual changes, shortness of breath, chest pain, abdominal pain, severe nausea/vomiting, or problems with urination or bowel movements unless otherwise stated above. Pertinent History Reviewed:  Reviewed past medical,surgical, social, obstetrical and family history.  Reviewed problem list, medications and allergies. Physical Assessment:   Vitals:   07/09/19 1446  BP: 115/74  Pulse: (!) 111  Temp: 98.3 F (36.8 C)  Weight: 264 lb (119.7 kg)  Body mass index is 46.03 kg/m.        Physical Examination:   General appearance: Well appearing, and in no distress  Mental status: Alert, oriented to person, place, and time  Skin: Warm & dry  Cardiovascular: Normal heart rate noted  Respiratory: Normal respiratory effort, no distress  Abdomen: Soft, gravid, nontender  Pelvic: Cervical exam deferred - GBS & GC/CT swabs obtained. Patient very intolerant of exam; reporting increased vaginal pain with speculum  Extremities: Edema: None  Fetal Status: Fetal Heart Rate (bpm): 120   Movement: Present    No results found for this or any previous visit (from the past 24 hour(s)).  Assessment & Plan:  1) Low-risk pregnancy G2P0010 at [redacted]w[redacted]d with an Estimated Date of Delivery: 08/03/19   2) Supervision of other normal pregnancy, antepartum  - Culture, beta strep (group b only),   - Cervicovaginal ancillary only( [place]),   3) Abnormal urine odor  - Culture, OB Urine,  - POC Urinalysis Dipstick OB  5) Vaginal discharge during pregnancy, antepartum  - Cervicovaginal ancillary only( [place])  6) Pap smear for cervical cancer screening  - Cytology - PAP( [place])  7) Abscess of left Bartholin's gland  - Advised to go to MAU now for I&D of Bartholin's abscess -- pt declined to go d/t transportation issues - Consult with Dr. Harolyn Rutherford @ [phone] - notified of patient's complaints, assessments, and lab results, recommended tx plan Rx Clindamycin 600 mg BID x 7 days - ok to d/c home - Rx for clindamycin (CLEOCIN) 300 MG capsule take 2 tabs BID x 7 days - Information provided on bartholin's abscess - Advised to soak in tub of warm water, take abx as prescribed and go to MAU for I&D, if not decreasing in size and/or reduction in pain by Monday 07/13/19 -- Patient verbalized an understanding of the plan of care and agrees.    Meds:  Meds ordered this encounter  Medications  . clindamycin (CLEOCIN) 300 MG capsule    Sig: Take 2 capsules (600 mg total) by mouth 3 (three) times daily for 7 days.    Dispense:  42 capsule    Refill:  0    Order Specific Question:   Supervising Provider    Answer:   Donnamae Jude [[id number]]   Labs/procedures today: CCUA, UCx, GBS, GC/CT, Wet Prep  Plan:  Continue routine obstetrical care   Reviewed: Preterm labor symptoms and general obstetric precautions including but not limited to vaginal bleeding,  contractions, leaking of fluid and fetal movement were reviewed in detail with the patient.  All questions were answered. Has home bp cuff. Check bp weekly, let us know if >140/90.   Follow-up: Return in about 1 week (around 07/16/2019) for Return OB visit.  Orders Placed This Encounter  Procedures  . Culture, OB Urine  . Culture, beta strep (group b only)  . Urine Culture, OB Reflex  . POC Urinalysis Dipstick OB   Raelyn Mora  MSN, CNM 07/09/2019 4:25 PM

## 2019-07-12 LAB — URINE CULTURE, OB REFLEX

## 2019-07-12 LAB — CULTURE, OB URINE

## 2019-07-12 LAB — CULTURE, BETA STREP (GROUP B ONLY): Strep Gp B Culture: POSITIVE — AB

## 2019-07-13 LAB — CERVICOVAGINAL ANCILLARY ONLY
Bacterial Vaginitis (gardnerella): NEGATIVE
Candida Glabrata: NEGATIVE
Candida Vaginitis: NEGATIVE
Chlamydia: NEGATIVE
Comment: NEGATIVE
Comment: NEGATIVE
Comment: NEGATIVE
Comment: NEGATIVE
Comment: NEGATIVE
Comment: NORMAL
Neisseria Gonorrhea: NEGATIVE
Trichomonas: POSITIVE — AB

## 2019-07-13 LAB — CYTOLOGY - PAP: Diagnosis: NEGATIVE

## 2019-07-15 DIAGNOSIS — O2340 Unspecified infection of urinary tract in pregnancy, unspecified trimester: Secondary | ICD-10-CM

## 2019-07-15 DIAGNOSIS — N898 Other specified noninflammatory disorders of vagina: Secondary | ICD-10-CM

## 2019-07-15 DIAGNOSIS — A599 Trichomoniasis, unspecified: Secondary | ICD-10-CM

## 2019-07-16 ENCOUNTER — Encounter: Payer: Medicaid Other | Admitting: Obstetrics and Gynecology

## 2019-07-16 MED ORDER — METRONIDAZOLE 500 MG PO TABS: 500.0000 mg | ORAL_TABLET | Freq: Two times a day (BID) | ORAL | 0 refills | Status: DC

## 2019-07-16 MED ORDER — CEFADROXIL 500 MG PO CAPS: 500.0000 mg | ORAL_CAPSULE | Freq: Two times a day (BID) | ORAL | 0 refills | Status: DC

## 2019-07-16 NOTE — Telephone Encounter (Signed)
-----   Message from [place], [place] sent at 07/15/2019  5:41 PM EDT ----- Treat with Flagyl 500 mg BID x 7 days NOT the normal 2 gram one-time dose

## 2019-07-22 ENCOUNTER — Encounter (HOSPITAL_COMMUNITY): Payer: Self-pay | Admitting: Obstetrics and Gynecology

## 2019-07-22 ENCOUNTER — Other Ambulatory Visit: Payer: Self-pay

## 2019-07-22 ENCOUNTER — Inpatient Hospital Stay (HOSPITAL_COMMUNITY)
Admission: AD | Admit: 2019-07-22 | Discharge: 2019-07-22 | Disposition: A | Discharge status: Home / Self Care | Payer: Medicaid Other | Attending: Obstetrics and Gynecology | Admitting: Obstetrics and Gynecology

## 2019-07-22 DIAGNOSIS — Z91018 Allergy to other foods: Secondary | ICD-10-CM | POA: Insufficient documentation

## 2019-07-22 DIAGNOSIS — A599 Trichomoniasis, unspecified: Secondary | ICD-10-CM | POA: Insufficient documentation

## 2019-07-22 DIAGNOSIS — Z87891 Personal history of nicotine dependence: Secondary | ICD-10-CM | POA: Diagnosis not present

## 2019-07-22 DIAGNOSIS — Z7982 Long term (current) use of aspirin: Secondary | ICD-10-CM | POA: Diagnosis not present

## 2019-07-22 DIAGNOSIS — O26893 Other specified pregnancy related conditions, third trimester: Secondary | ICD-10-CM | POA: Diagnosis not present

## 2019-07-22 DIAGNOSIS — Z3A38 38 weeks gestation of pregnancy: Secondary | ICD-10-CM | POA: Diagnosis not present

## 2019-07-22 DIAGNOSIS — O98313 Other infections with a predominantly sexual mode of transmission complicating pregnancy, third trimester: Secondary | ICD-10-CM | POA: Diagnosis not present

## 2019-07-22 DIAGNOSIS — N750 Cyst of Bartholin's gland: Secondary | ICD-10-CM | POA: Insufficient documentation

## 2019-07-22 DIAGNOSIS — Z79899 Other long term (current) drug therapy: Secondary | ICD-10-CM | POA: Diagnosis not present

## 2019-07-22 LAB — WET PREP, GENITAL
Clue Cells Wet Prep HPF POC: NONE SEEN
Sperm: NONE SEEN
Yeast Wet Prep HPF POC: NONE SEEN

## 2019-07-22 MED ORDER — LIDOCAINE HCL (PF) 1 % IJ SOLN: 30.0000 mL | Freq: Once | INTRAMUSCULAR | Status: DC

## 2019-07-22 MED ORDER — CLINDAMYCIN HCL 150 MG PO CAPS
450.0000 mg | ORAL_CAPSULE | Freq: Three times a day (TID) | ORAL | 0 refills | Status: AC
Start: 2019-07-22 — End: 2019-07-29

## 2019-07-22 NOTE — Discharge Instructions (Signed)
Bartholin's Cyst  A Bartholin's cyst is a fluid-filled sac that forms on a Bartholin's gland. Bartholin's glands are small glands in the folds of skin around the opening of the vagina (labia). This type of cyst causes a bulge or lump near the lower opening of the vagina. If you have a cyst that is small and not infected, you may be able to take care of it at home. If your cyst gets infected, it may cause pain and your doctor may need to drain it. An infected Bartholin's cyst is called a Bartholin's abscess. Follow these instructions at home: Medicines  Take over-the-counter and prescription medicines only as told by your doctor.  If you were prescribed an antibiotic medicine, take it as told by your doctor. Do not stop taking the antibiotic even if you start to feel better. Managing pain and swelling  Try sitz baths to help with pain and swelling. A sitz bath is a warm water bath in which the water only comes up to your hips and should cover your buttocks. You may take sitz baths a few times a day.  Put heat on the affected area as often as needed. Use the heat source that your doctor recommends, such as a moist heat pack or a heating pad. ? Place a towel between your skin and the heat source. ? Leave the heat on for 20-30 minutes. ? Remove the heat if your skin turns bright red. This is especially important if you cannot feel pain, heat, or cold. You may have a greater risk of getting burned. General instructions  If your cyst or abscess was drained: ? Follow instructions from your doctor about how to take care of your wound. ? Use feminine pads to absorb any fluid.  Do not push on or squeeze your cyst.  Do not have sex until the cyst has gone away or your wound from drainage has healed.  Take these steps to help prevent a Bartholin's cyst from returning, and to prevent other Bartholin's cysts from forming: ? Take a bath or shower once a day. Clean your vaginal area with mild soap and  water when you bathe. ? Practice safe sex to prevent STIs (sexually transmitted infections). Talk with your doctor about how to prevent STIs and which forms of birth control (contraception) may be best for you.  Keep all follow-up visits as told by your doctor. This is important. Contact a doctor if:  You have a fever.  You get redness, swelling, or pain around your cyst.  You have fluid, blood, pus, or a bad smell coming from your cyst.  You have a cyst that gets larger or comes back. Summary  A Bartholin's cyst is a fluid-filled sac that forms on a Bartholin's gland. These small glands are found in the folds of skin around the opening of the vagina (labia).  This type of cyst causes a bulge or lump near the lower opening of the vagina. An infected Bartholin's cyst is called a Bartholin's abscess.  Try sitz baths a few times a day to help with pain and swelling.  Do not push on or squeeze your cyst. This information is not intended to replace advice given to you by your health care provider. Make sure you discuss any questions you have with your health care provider. Document Revised: 12/19/2017 Document Reviewed: 11/28/2016 Elsevier Patient Education  2020 [employer].   Trichomoniasis Trichomoniasis is an STI (sexually transmitted infection) that can affect both women and men. In  women, the outer area of the female genitalia (vulva) and the vagina are affected. In men, mainly the penis is affected, but the prostate and other reproductive organs can also be involved.  This condition can be treated with medicine. It often has no symptoms (is asymptomatic), especially in men. If not treated, trichomoniasis can last for months or years. What are the causes? This condition is caused by a parasite called Trichomonas vaginalis. Trichomoniasis most often spreads from person to person (is contagious) through sexual contact. What increases the risk? The following factors may make you more  likely to develop this condition:  Having unprotected sex.  Having sex with a partner who has trichomoniasis.  Having multiple sexual partners.  Having had previous trichomoniasis infections or other STIs. What are the signs or symptoms? In women, symptoms of trichomoniasis include:  Abnormal vaginal discharge that is clear, white, gray, or yellow-green and foamy and has an unusual "fishy" odor.  Itching and irritation of the vagina and vulva.  Burning or pain during urination or sex.  Redness and swelling of the genitals. In men, symptoms of trichomoniasis include:  Penile discharge that may be foamy or contain pus.  Pain in the penis. This may happen only when urinating.  Itching or irritation inside the penis.  Burning after urination or ejaculation. How is this diagnosed? In women, this condition may be found during a routine Pap test or physical exam. It may be found in men during a routine physical exam. Your health care provider may do tests to help diagnose this infection, such as:  Urine tests (men and women).  The following in women: ? Testing the pH of the vagina. ? A vaginal swab test that checks for the Trichomonas vaginalis parasite. ? Testing vaginal secretions. Your health care provider may test you for other STIs, including HIV (human immunodeficiency virus). How is this treated? This condition is treated with medicine taken by mouth (orally), such as metronidazole or tinidazole, to fight the infection. Your sexual partner(s) also need to be tested and treated.  If you are a woman and you plan to become pregnant or think you may be pregnant, tell your health care provider right away. Some medicines that are used to treat the infection should not be taken during pregnancy. Your health care provider may recommend over-the-counter medicines or creams to help relieve itching or irritation. You may be tested for infection again 3 months after treatment. Follow  these instructions at home:  Take and use over-the-counter and prescription medicines, including creams, only as told by your health care provider.  Take your antibiotic medicine as told by your health care provider. Do not stop taking the antibiotic even if you start to feel better.  Do not have sex until 7-10 days after you finish your medicine, or until your health care provider approves. Ask your health care provider when you may start to have sex again.  (Women) Do not douche or wear tampons while you have the infection.  Discuss your infection with your sexual partner(s). Make sure that your partner gets tested and treated, if necessary.  Keep all follow-up visits as told by your health care provider. This is important. How is this prevented?   Use condoms every time you have sex. Using condoms correctly and consistently can help protect against STIs.  Avoid having multiple sexual partners.  Talk with your sexual partner about any symptoms that either of you may have, as well as any history of STIs.  Get tested for STIs and STDs (sexually transmitted diseases) before you have sex. Ask your partner to do the same.  Do not have sexual contact if you have symptoms of trichomoniasis or another STI. Contact a health care provider if:  You still have symptoms after you finish your medicine.  You develop pain in your abdomen.  You have pain when you urinate.  You have bleeding after sex.  You develop a rash.  You feel nauseous or you vomit.  You plan to become pregnant or think you may be pregnant. Summary  Trichomoniasis is an STI (sexually transmitted infection) that can affect both women and men.  This condition often has no symptoms (is asymptomatic), especially in men.  Without treatment, this condition can last for months or years.  You should not have sex until 7-10 days after you finish your medicine, or until your health care provider approves. Ask your health  care provider when you may start to have sex again.  Discuss your infection with your sexual partner(s). Make sure that your partner gets tested and treated, if necessary. This information is not intended to replace advice given to you by your health care provider. Make sure you discuss any questions you have with your health care provider. Document Revised: 12/10/2017 Document Reviewed: 12/10/2017 Elsevier Patient Education  2020 [employer].
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
## 2019-07-22 NOTE — MAU Note (Signed)
Patient states she was diagnosed with a bartholin's a few weeks back and has been unable to fill any prescriptions for her antibiotics.  They told her she may have to have the cyst drained in MAU.  States she has been having sharp lower abdominal pain when she changes positions and some periumbilical pain when the baby kicks in that area.  Denies pain otherwise.  Denies LOF/VB/CTX or vaginal pain.

## 2019-07-31 ENCOUNTER — Other Ambulatory Visit: Payer: Self-pay

## 2019-07-31 ENCOUNTER — Ambulatory Visit (INDEPENDENT_AMBULATORY_CARE_PROVIDER_SITE_OTHER): Payer: Medicaid Other

## 2019-07-31 VITALS — BP 121/81 | HR 118 | Temp 98.0°F | Wt 271.2 lb

## 2019-07-31 DIAGNOSIS — O0973 Supervision of high risk pregnancy due to social problems, third trimester: Secondary | ICD-10-CM

## 2019-07-31 DIAGNOSIS — Z609 Problem related to social environment, unspecified: Secondary | ICD-10-CM

## 2019-07-31 DIAGNOSIS — B951 Streptococcus, group B, as the cause of diseases classified elsewhere: Secondary | ICD-10-CM | POA: Insufficient documentation

## 2019-07-31 DIAGNOSIS — Z3A39 39 weeks gestation of pregnancy: Secondary | ICD-10-CM

## 2019-07-31 DIAGNOSIS — O98813 Other maternal infectious and parasitic diseases complicating pregnancy, third trimester: Secondary | ICD-10-CM

## 2019-07-31 DIAGNOSIS — Z3480 Encounter for supervision of other normal pregnancy, unspecified trimester: Secondary | ICD-10-CM
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
## 2019-07-31 NOTE — Patient Instructions (Signed)

## 2019-07-31 NOTE — Progress Notes (Signed)
   Induction Assessment Scheduling Form: Fax to [facility]:  [phone]  Bonnie Cobb                                                                                   DOB:  26-Dec-1995                                                            MRN:  [id number]                                                                     Phone #:   [phone]                         Provider:  Vivia Birmingham  GP:  G2P0010                                                            Estimated Date of Delivery: 08/03/19  Dating Criteria: 10 week [place]    Medical Indications for induction:  PostDates Admission Date/Time:  Saturday May 29th at [facility] Gestational age on admission:  40.5wks   Filed Weights   07/31/19 0951  Weight: 271 lb 3.2 oz (123 kg)   HIV:  Non Reactive (03/04 0825) GBS: Positive/-- (04/29 1525)  Bishop score TBD   Method of induction(proposed):  TBD-Likely Cytotec   Scheduling Provider Signature:  Cherre Robins, CNM                                            Today's Date:  07/31/2019

## 2019-08-01 ENCOUNTER — Other Ambulatory Visit: Payer: Self-pay | Admitting: Family Medicine

## 2019-08-01 NOTE — Progress Notes (Signed)
Orders for admission placed  Shaianne Nucci, Margarette Asal, DO OB Fellow, Faculty Practice 08/01/2019 7:19 AM

## 2019-08-02 ENCOUNTER — Other Ambulatory Visit: Payer: Self-pay

## 2019-08-02 ENCOUNTER — Inpatient Hospital Stay (HOSPITAL_COMMUNITY)
Admission: AD | Admit: 2019-08-02 | Discharge: 2019-08-06 | DRG: 806 | Disposition: A | Discharge status: Home / Self Care | Payer: Medicaid Other | Attending: Family Medicine | Admitting: Family Medicine

## 2019-08-02 ENCOUNTER — Encounter (HOSPITAL_COMMUNITY): Payer: Self-pay | Admitting: Obstetrics and Gynecology

## 2019-08-02 DIAGNOSIS — Z20822 Contact with and (suspected) exposure to covid-19: Secondary | ICD-10-CM | POA: Diagnosis present

## 2019-08-02 DIAGNOSIS — Z3A40 40 weeks gestation of pregnancy: Secondary | ICD-10-CM

## 2019-08-02 DIAGNOSIS — F909 Attention-deficit hyperactivity disorder, unspecified type: Secondary | ICD-10-CM | POA: Diagnosis present

## 2019-08-02 DIAGNOSIS — O99824 Streptococcus B carrier state complicating childbirth: Secondary | ICD-10-CM | POA: Diagnosis present

## 2019-08-02 DIAGNOSIS — Z8659 Personal history of other mental and behavioral disorders: Secondary | ICD-10-CM

## 2019-08-02 DIAGNOSIS — E669 Obesity, unspecified: Secondary | ICD-10-CM | POA: Diagnosis present

## 2019-08-02 DIAGNOSIS — N750 Cyst of Bartholin's gland: Secondary | ICD-10-CM | POA: Diagnosis present

## 2019-08-02 DIAGNOSIS — A5901 Trichomonal vulvovaginitis: Secondary | ICD-10-CM | POA: Diagnosis present

## 2019-08-02 DIAGNOSIS — F1290 Cannabis use, unspecified, uncomplicated: Secondary | ICD-10-CM | POA: Diagnosis present

## 2019-08-02 DIAGNOSIS — O9081 Anemia of the puerperium: Secondary | ICD-10-CM | POA: Diagnosis not present

## 2019-08-02 DIAGNOSIS — D563 Thalassemia minor: Secondary | ICD-10-CM | POA: Diagnosis present

## 2019-08-02 DIAGNOSIS — O99344 Other mental disorders complicating childbirth: Secondary | ICD-10-CM | POA: Diagnosis present

## 2019-08-02 DIAGNOSIS — O99324 Drug use complicating childbirth: Secondary | ICD-10-CM | POA: Diagnosis present

## 2019-08-02 DIAGNOSIS — O99214 Obesity complicating childbirth: Secondary | ICD-10-CM | POA: Diagnosis present

## 2019-08-02 DIAGNOSIS — D62 Acute posthemorrhagic anemia: Secondary | ICD-10-CM | POA: Diagnosis not present

## 2019-08-02 DIAGNOSIS — Z87891 Personal history of nicotine dependence: Secondary | ICD-10-CM

## 2019-08-02 DIAGNOSIS — O134 Gestational [pregnancy-induced] hypertension without significant proteinuria, complicating childbirth: Secondary | ICD-10-CM | POA: Diagnosis present

## 2019-08-02 DIAGNOSIS — R8271 Bacteriuria: Secondary | ICD-10-CM | POA: Diagnosis present

## 2019-08-02 DIAGNOSIS — Z3490 Encounter for supervision of normal pregnancy, unspecified, unspecified trimester: Secondary | ICD-10-CM | POA: Diagnosis present

## 2019-08-02 DIAGNOSIS — O368130 Decreased fetal movements, third trimester, not applicable or unspecified: Principal | ICD-10-CM | POA: Diagnosis present

## 2019-08-02 DIAGNOSIS — O99892 Other specified diseases and conditions complicating childbirth: Secondary | ICD-10-CM | POA: Diagnosis present

## 2019-08-02 DIAGNOSIS — O99012 Anemia complicating pregnancy, second trimester: Secondary | ICD-10-CM | POA: Diagnosis present

## 2019-08-02 DIAGNOSIS — Z8619 Personal history of other infectious and parasitic diseases: Secondary | ICD-10-CM | POA: Insufficient documentation

## 2019-08-02 DIAGNOSIS — Z3480 Encounter for supervision of other normal pregnancy, unspecified trimester: Secondary | ICD-10-CM

## 2019-08-02 DIAGNOSIS — O26893 Other specified pregnancy related conditions, third trimester: Secondary | ICD-10-CM | POA: Diagnosis present

## 2019-08-02 DIAGNOSIS — O23592 Infection of other part of genital tract in pregnancy, second trimester: Secondary | ICD-10-CM

## 2019-08-02 DIAGNOSIS — O361990 Maternal care for other isoimmunization, unspecified trimester, not applicable or unspecified: Secondary | ICD-10-CM | POA: Diagnosis present

## 2019-08-02 LAB — CBC
HCT: 35.1 % — ABNORMAL LOW (ref 36.0–46.0)
Hemoglobin: 11 g/dL — ABNORMAL LOW (ref 12.0–15.0)
MCH: 23.2 pg — ABNORMAL LOW (ref 26.0–34.0)
MCHC: 31.3 g/dL (ref 30.0–36.0)
MCV: 74.1 fL — ABNORMAL LOW (ref 80.0–100.0)
Platelets: 366 10*3/uL (ref 150–400)
RBC: 4.74 MIL/uL (ref 3.87–5.11)
RDW: 17.4 % — ABNORMAL HIGH (ref 11.5–15.5)
WBC: 7.6 10*3/uL (ref 4.0–10.5)
nRBC: 0 % (ref 0.0–0.2)

## 2019-08-02 LAB — HEPATITIS C ANTIBODY: HCV Ab: NONREACTIVE

## 2019-08-02 MED ORDER — ACETAMINOPHEN 325 MG PO TABS: 650.0000 mg | ORAL_TABLET | ORAL | Status: DC | PRN

## 2019-08-02 MED ORDER — PENICILLIN G POT IN DEXTROSE 60000 UNIT/ML IV SOLN
3.0000 10*6.[IU] | INTRAVENOUS | Status: DC
  Administered 2019-08-03 – 2019-08-04 (×9): 3 10*6.[IU] via INTRAVENOUS
  Filled 2019-08-02 (×9): qty 50

## 2019-08-02 MED ORDER — MISOPROSTOL 50MCG HALF TABLET
50.0000 ug | ORAL_TABLET | ORAL | Status: DC | PRN
  Administered 2019-08-02 – 2019-08-03 (×3): 50 ug via ORAL
  Filled 2019-08-02 (×3): qty 1

## 2019-08-02 MED ORDER — LACTATED RINGERS IV SOLN: INTRAVENOUS | Status: DC

## 2019-08-02 MED ORDER — FENTANYL CITRATE (PF) 100 MCG/2ML IJ SOLN
50.0000 ug | INTRAMUSCULAR | Status: DC | PRN
  Administered 2019-08-03 – 2019-08-04 (×8): 100 ug via INTRAVENOUS
  Filled 2019-08-02 (×9): qty 2

## 2019-08-02 MED ORDER — OXYTOCIN BOLUS FROM INFUSION
500.0000 mL | Freq: Once | INTRAVENOUS | Status: AC
  Administered 2019-08-04: 500 mL via INTRAVENOUS

## 2019-08-02 MED ORDER — ONDANSETRON HCL 4 MG/2ML IJ SOLN: 4.0000 mg | Freq: Four times a day (QID) | INTRAMUSCULAR | Status: DC | PRN

## 2019-08-02 MED ORDER — SOD CITRATE-CITRIC ACID 500-334 MG/5ML PO SOLN: 30.0000 mL | ORAL | Status: DC | PRN

## 2019-08-02 MED ORDER — TERBUTALINE SULFATE 1 MG/ML IJ SOLN: 0.2500 mg | Freq: Once | INTRAMUSCULAR | Status: DC | PRN

## 2019-08-02 MED ORDER — OXYTOCIN 40 UNITS IN NORMAL SALINE INFUSION - SIMPLE MED: 2.5000 [IU]/h | INTRAVENOUS | Status: DC

## 2019-08-02 MED ORDER — SODIUM CHLORIDE 0.9 % IV SOLN
5.0000 10*6.[IU] | Freq: Once | INTRAVENOUS | Status: AC
  Administered 2019-08-02: 5 10*6.[IU] via INTRAVENOUS
  Filled 2019-08-02: qty 5

## 2019-08-02 MED ORDER — LACTATED RINGERS IV SOLN: 500.0000 mL | INTRAVENOUS | Status: DC | PRN

## 2019-08-02 MED ORDER — LIDOCAINE HCL (PF) 1 % IJ SOLN: 30.0000 mL | INTRAMUSCULAR | Status: DC | PRN

## 2019-08-02 NOTE — Progress Notes (Signed)
S: [name] is a 24 y.o. G2P0010 at [redacted]w[redacted]d  who presents to MAU today complaining contractions q 5-10 minutes since noon. She denies vaginal bleeding. She denies LOF. She reports intermittent decreased fetal movement.    O: BP (!) 120/57 (BP Location: Right Arm)   Pulse (!) 117   Temp 98.1 F (36.7 C) (Oral)   Resp 18   Ht 5\' 3"  (1.6 m)   Wt 123.7 kg   LMP 10/27/2018   SpO2 100%   BMI 48.32 kg/m  GENERAL: Well-developed, well-nourished female in no acute distress.  HEAD: Normocephalic, atraumatic.  CHEST: Normal effort of breathing, regular heart rate ABDOMEN: Soft, nontender, gravid  Cervical exam:  Dilation: Closed Cervical Position: Posterior Exam by:: [email or web address], RN   Fetal Monitoring: Baseline: 150 Variability: moderate Accelerations: 15x15 Decelerations: none Contractions: occasional uc's   A: SIUP at [redacted]w[redacted]d  Early labor with decreased fetal movement. Will admit for IOL  P: Report called to labor team SW consult in the am for unstable home situation  [redacted]w[redacted]d, Rolm Bookbinder 08/02/2019 8:02 PM

## 2019-08-02 NOTE — MAU Note (Signed)
PT c/o contractions that started this morning, stopped, then restarted about 2 pm. Now 10-15 mins apart. Denies LOF, VB, +FM.

## 2019-08-02 NOTE — MAU Note (Signed)
While this RN was starting her IV and draw labs. Pt asked if she could drink her blood. This RN asked if she had done that before and she stated "sometimes I bite my lip and drink the blood."
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
## 2019-08-03 LAB — COMPREHENSIVE METABOLIC PANEL
ALT: 14 U/L (ref 0–44)
AST: 31 U/L (ref 15–41)
Albumin: 3 g/dL — ABNORMAL LOW (ref 3.5–5.0)
Alkaline Phosphatase: 167 U/L — ABNORMAL HIGH (ref 38–126)
Anion gap: 16 — ABNORMAL HIGH (ref 5–15)
BUN: <5 mg/dL — ABNORMAL LOW (ref 6–20)
CO2: 20 mmol/L — ABNORMAL LOW (ref 22–32)
Calcium: 9.6 mg/dL (ref 8.9–10.3)
Chloride: 101 mmol/L (ref 98–111)
Creatinine, Ser: 0.54 mg/dL (ref 0.44–1.00)
GFR calc Af Amer: >60 mL/min (ref >60)
GFR calc non Af Amer: >60 mL/min (ref >60)
Glucose, Bld: 84 mg/dL (ref 70–99)
Potassium: 4.2 mmol/L (ref 3.5–5.1)
Sodium: 137 mmol/L (ref 135–145)
Total Bilirubin: 0.5 mg/dL (ref 0.3–1.2)
Total Protein: 6.5 g/dL (ref 6.5–8.1)

## 2019-08-03 LAB — SARS CORONAVIRUS 2 (TAT 6-24 HRS): SARS Coronavirus 2: NEGATIVE

## 2019-08-03 LAB — RPR: RPR Ser Ql: NONREACTIVE

## 2019-08-03 LAB — PROTEIN / CREATININE RATIO, URINE
Creatinine, Urine: 68.39 mg/dL
Protein Creatinine Ratio: 0.28 mg/mg{Cre} — ABNORMAL HIGH (ref 0.00–0.15)
Total Protein, Urine: 19 mg/dL

## 2019-08-03 MED ORDER — MISOPROSTOL 25 MCG QUARTER TABLET
ORAL_TABLET | ORAL | Status: AC
  Administered 2019-08-03: 25 ug
  Filled 2019-08-03: qty 1

## 2019-08-03 MED ORDER — OXYTOCIN 40 UNITS IN NORMAL SALINE INFUSION - SIMPLE MED
1.0000 m[IU]/min | INTRAVENOUS | Status: DC
  Administered 2019-08-03: 2 m[IU]/min via INTRAVENOUS
  Filled 2019-08-03: qty 1000

## 2019-08-03 MED ORDER — MISOPROSTOL 25 MCG QUARTER TABLET: 25.0000 ug | ORAL_TABLET | ORAL | Status: DC

## 2019-08-03 MED ORDER — TERBUTALINE SULFATE 1 MG/ML IJ SOLN: 0.2500 mg | Freq: Once | INTRAMUSCULAR | Status: DC | PRN

## 2019-08-03 NOTE — Progress Notes (Signed)
Bonnie Cobb is a 24 y.o. G2P0010 at [redacted]w[redacted]d admitted for induction of labor due to decreased fetal movement.  Subjective: Resting comfortably  Objective: BP 112/66   Pulse (!) 110   Temp 98.3 F (36.8 C) (Oral)   Resp 18   Ht 5\' 3"  (1.6 m)   Wt 123.7 kg   LMP 10/27/2018   SpO2 100%   BMI 48.32 kg/m  No intake/output data recorded.  FHT:  FHR: 140 bpm, variability: moderate,  accelerations:  Present,  decelerations:  Absent UC:   irregular, every 25-30 minutes  SVE:   Dilation: Fingertip Effacement (%): Thick Station: -3 Exam by:: Dr. [email or web address]  Labs: Lab Results  Component Value Date   WBC 7.6 08/02/2019   HGB 11.0 (L) 08/02/2019   HCT 35.1 (L) 08/02/2019   MCV 74.1 (L) 08/02/2019   PLT 366 08/02/2019    Assessment / Plan: G2P0010 at 40.0 EGA here for IOL 2/2 DFM  Labor: s/p cytotec x2. Consider FB next check. Patient does not tolerate vaginal exams well. Consider pre-medicated exams. Fetal Wellbeing:  Category I Pain Control:  per patient request Elevated BP on admission: CMP and CBC normal. P:Cr not yet collected. Most recent BP normotensive I/D:  GBS+, PCN Anticipated MOD:  vaginal  08/04/2019 Bonnie Bettendorf DO OB Fellow, Faculty Practice 08/03/2019, 3:58 AM

## 2019-08-03 NOTE — Progress Notes (Signed)
Labor Progress Note NAZANIN KINNER is a 24 y.o. G2P0010 at [redacted]w[redacted]d presented for IOL for dec FM  S:  Comfortable, had recent dose of Fentanyl in prep for FB insertion. Mother at bedside with pt.   O:  BP 126/69   Pulse (!) 101   Temp 98.6 F (37 C) (Oral)   Resp 18   Ht 5\' 3"  (1.6 m)   Wt 123.7 kg   LMP 10/27/2018   SpO2 100%   BMI 48.32 kg/m  EFM: baseline 135 bpm/ mod variability/ + accels/ no decels  Toco/IUPC: 3-4 SVE: Dilation: Fingertip Effacement (%): Thick Cervical Position: Posterior Station: -3 Presentation: Vertex Exam by:: [email or web address]   A/P: 24 y.o. G2P0010 [redacted]w[redacted]d  1. Labor: IOL 2. FWB: Cat I 3. Pain: analgesia prn  FB inserted w/o problem, pt tolerated well. Continue Cytotec. Pitocin once FB out. Anticipate labor progress and SVD.  [redacted]w[redacted]d, CNM 3:04 PM

## 2019-08-03 NOTE — Progress Notes (Signed)
Patient ID: Bonnie Cobb, female   DOB: 1995-09-22, 22 y.o.   MRN: [id number] Vitals:   08/02/19 1901 08/02/19 1936 08/02/19 2107 08/02/19 2208  BP:  (!) 120/57 (!) 136/91 124/90  Pulse:  (!) 117 (!) 110 (!) 114  Resp:  18 18 18   Temp:  98.1 F (36.7 C) 98.2 F (36.8 C)   TempSrc:  Oral Oral   SpO2:  100%    Weight: 123.7 kg     Height: 5\' 3"  (1.6 m)      Doing well Cytotec in situ  Cx deferred  WIll add CMET and Protein/Creatinine Ratio due to two elevated BPs.
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
## 2019-08-03 NOTE — Progress Notes (Signed)
Labor Progress Note Bonnie Cobb is a 24 y.o. G2P0010 at [redacted]w[redacted]d presented for IOL for DFM. Had Fentanyl recently in prep for exam.  S:  Comfortable, not sure of feeling ctx.  O:  BP 128/62   Pulse (!) 110   Temp 97.7 F (36.5 C) (Oral)   Resp 18   Ht 5\' 3"  (1.6 m)   Wt 123.7 kg   LMP 10/27/2018   SpO2 100%   BMI 48.32 kg/m  EFM: baseline 135 bpm/ mod variability/ + accels/ no decels  Toco/IUPC: irregular SVE: Dilation: 5.5 Effacement (%): Thick Cervical Position: Posterior Station: -3 Presentation: Vertex Exam by:: Bhambri CNM  A/P: 24 y.o. G2P0010 [redacted]w[redacted]d  1. Labor: latent 2. FWB: Cat I 3. Pain: analgesia/anestheia/NO prn  Recommend starting Pitocin, pt agrees with plan. Anticipate labor progress and SVD.  [redacted]w[redacted]d, CNM 6:59 PM

## 2019-08-04 ENCOUNTER — Encounter (HOSPITAL_COMMUNITY): Payer: Self-pay | Admitting: Obstetrics and Gynecology

## 2019-08-04 DIAGNOSIS — Z3A40 40 weeks gestation of pregnancy: Secondary | ICD-10-CM

## 2019-08-04 DIAGNOSIS — O99824 Streptococcus B carrier state complicating childbirth: Secondary | ICD-10-CM

## 2019-08-04 MED ORDER — DIPHENHYDRAMINE HCL 25 MG PO CAPS: 25.0000 mg | ORAL_CAPSULE | Freq: Four times a day (QID) | ORAL | Status: DC | PRN

## 2019-08-04 MED ORDER — SENNOSIDES-DOCUSATE SODIUM 8.6-50 MG PO TABS
2.0000 | ORAL_TABLET | ORAL | Status: DC
  Administered 2019-08-04 – 2019-08-06 (×2): 2 via ORAL
  Filled 2019-08-04 (×2): qty 2

## 2019-08-04 MED ORDER — IBUPROFEN 600 MG PO TABS
600.0000 mg | ORAL_TABLET | Freq: Four times a day (QID) | ORAL | Status: DC
  Administered 2019-08-04 – 2019-08-06 (×8): 600 mg via ORAL
  Filled 2019-08-04 (×7): qty 1

## 2019-08-04 MED ORDER — MEASLES, MUMPS & RUBELLA VAC IJ SOLR: 0.5000 mL | Freq: Once | INTRAMUSCULAR | Status: DC

## 2019-08-04 MED ORDER — CLINDAMYCIN HCL 150 MG PO CAPS
150.0000 mg | ORAL_CAPSULE | Freq: Three times a day (TID) | ORAL | Status: DC
  Administered 2019-08-04 – 2019-08-06 (×7): 150 mg via ORAL
  Filled 2019-08-04 (×11): qty 1

## 2019-08-04 MED ORDER — OXYCODONE HCL 5 MG PO TABS: 5.0000 mg | ORAL_TABLET | ORAL | Status: DC | PRN

## 2019-08-04 MED ORDER — PRENATAL MULTIVITAMIN CH
1.0000 | ORAL_TABLET | Freq: Every day | ORAL | Status: DC
  Administered 2019-08-04 – 2019-08-06 (×3): 1 via ORAL
  Filled 2019-08-04 (×2): qty 1

## 2019-08-04 MED ORDER — TETANUS-DIPHTH-ACELL PERTUSSIS 5-2.5-18.5 LF-MCG/0.5 IM SUSP: 0.5000 mL | Freq: Once | INTRAMUSCULAR | Status: DC

## 2019-08-04 MED ORDER — WITCH HAZEL-GLYCERIN EX PADS: 1.0000 "application " | MEDICATED_PAD | CUTANEOUS | Status: DC | PRN

## 2019-08-04 MED ORDER — COCONUT OIL OIL: 1.0000 "application " | TOPICAL_OIL | Status: DC | PRN

## 2019-08-04 MED ORDER — SIMETHICONE 80 MG PO CHEW: 80.0000 mg | CHEWABLE_TABLET | ORAL | Status: DC | PRN

## 2019-08-04 MED ORDER — DIBUCAINE (PERIANAL) 1 % EX OINT: 1.0000 "application " | TOPICAL_OINTMENT | CUTANEOUS | Status: DC | PRN

## 2019-08-04 MED ORDER — ACETAMINOPHEN 325 MG PO TABS: 650.0000 mg | ORAL_TABLET | ORAL | Status: DC | PRN

## 2019-08-04 MED ORDER — ONDANSETRON HCL 4 MG PO TABS
4.0000 mg | ORAL_TABLET | ORAL | Status: DC | PRN
  Administered 2019-08-06: 4 mg via ORAL
  Filled 2019-08-04: qty 1

## 2019-08-04 MED ORDER — TERBUTALINE SULFATE 1 MG/ML IJ SOLN: 0.2500 mg | Freq: Once | INTRAMUSCULAR | Status: DC | PRN

## 2019-08-04 MED ORDER — ONDANSETRON HCL 4 MG/2ML IJ SOLN: 4.0000 mg | INTRAMUSCULAR | Status: DC | PRN

## 2019-08-04 MED ORDER — BUTORPHANOL TARTRATE 1 MG/ML IJ SOLN
2.0000 mg | Freq: Once | INTRAMUSCULAR | Status: AC
  Administered 2019-08-04: 2 mg via INTRAVENOUS
  Filled 2019-08-04 (×2): qty 2

## 2019-08-04 MED ORDER — ZOLPIDEM TARTRATE 5 MG PO TABS: 5.0000 mg | ORAL_TABLET | Freq: Every evening | ORAL | Status: DC | PRN

## 2019-08-04 MED ORDER — BENZOCAINE-MENTHOL 20-0.5 % EX AERO
1.0000 "application " | INHALATION_SPRAY | CUTANEOUS | Status: DC | PRN
  Filled 2019-08-04: qty 56

## 2019-08-04 NOTE — Progress Notes (Signed)
Pt requesting IV to be removed; educated patient on the reasons to have the IV in at this time during recovery after delivery and the possibility of needing another IV inserted later if this one is removed. Pt verbalizes understanding and requests to have IV removed. RN removed IV, catheter tip intact, site assessment is clean and dry.
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
## 2019-08-04 NOTE — Lactation Note (Signed)
This note was copied from a baby's chart. Lactation Consultation Note  Patient Name: Bonnie Cobb YQIHK'V Date: 08/04/2019 Reason for consult: Initial assessment;1st time breastfeeding;Term P1, 10 hour term female infant. Per mom, she has been having difficulties getting infant to latch at breast. Tools given: mom was given hand pump earlier by [facility] due to having short shaft nipple to pre-pump breast prior to latching infant.  Per mom, she has been using the DEBP to help with her milk supply and mom knows how to hand express.  LC asked mom to do breast stimulation and express a small amount of colostrum out prior to latching infant at breast. Mom latched infant on her left breast using the football after a few attempts infant sustained latch and was still breastfeeding after 10 minutes when LC left the room. LC assisted mom in getting infant to open his mouth wide due mom having large breast and short shaft nipples. Mom knows if she feels a pinch and not a tug to break latch and re-latch infant to prevent sore nipples. Mom knows to call RN or LC if she needs assistance with latching infant at breast. Mom will continue to breastfeed infant according to hunger cues, on demand, 8 to 12+ times within 24 hours and not exceed 3 hours without latching infant at breast. Mom will continue to use DEBP as advised earlier by [facility] and give infant back any EBM. Maternal Data    Feeding Feeding Type: (encouraged to latch infant)  LATCH Score Latch: Grasps breast easily, tongue down, lips flanged, rhythmical sucking.  Audible Swallowing: Spontaneous and intermittent  Type of Nipple: Everted at rest and after stimulation(short shafted has hand pump to pre-pump breast prior to latching infant.)  Comfort (Breast/Nipple): Soft / non-tender  Hold (Positioning): Assistance needed to correctly position infant at breast and maintain latch.  LATCH Score: 9  Interventions Interventions: Breast feeding  basics reviewed;Breast compression;Adjust position;Assisted with latch;Hand pump;DEBP;Support pillows;Skin to skin;Breast massage;Position options;Hand express;Expressed milk;Pre-pump if needed  Lactation Tools Discussed/Used     Consult Status Consult Status: Follow-up Date: 08/05/19 Follow-up type: In-patient    Danelle Earthly 08/04/2019, 9:42 PM

## 2019-08-04 NOTE — Progress Notes (Addendum)
Patient ID: FIDELIA CATHERS, female   DOB: 1996-02-09, 24 y.o.   MRN: [id number]  Feeling pressure; requesting exam; PCN x multiple doses  BP 131/74, P 107 FHR 125-130s, +accels, no decels, occ variables, Cat 1 Ctx q 3-5 mins with Pit at 60mu/min Cx 7/80/vtx -2  IUP@40 .1wks Active labor GBS pos Bartholin's  Keep ctx reg with Pitocin- making good cervical change Anticipate vag del Will continue home clindamycin regimen for Bartholin's cyst  Arabella Merles CNM 08/04/2019 9:08 AM

## 2019-08-04 NOTE — Progress Notes (Addendum)
Labor Progress Note Bonnie Cobb is a 24 y.o. G2P0010 at [redacted]w[redacted]d presented for IOL for DFM.   S:  Very uncomfortable with ctx.  O:  BP 135/71   Pulse 89   Temp 98.2 F (36.8 C) (Oral)   Resp 17   Ht 5\' 3"  (1.6 m)   Wt 123.7 kg   LMP 10/27/2018   SpO2 100%   BMI 48.32 kg/m  EFM: 140, moderate variability, pos accels, no decels, reactive Toco: q69m  SVE: Dilation: 5.5 Effacement (%): 90 Cervical Position: Anterior Station: -1 Presentation: Vertex Exam by:: Ilyssa Grennan  A/P: 24 y.o. G2P0010 [redacted]w[redacted]d here for IOL for DFM. 1. Labor: S/p FB, Cytotec and AROM. Cont Pitocin. Progressing well in effacement and station. Anticipate SVD. 2. FWB: Cat I 3. Pain: per patient request  4. GBS pos; Vanc  Addendum: Patient refusing to wear monitors. Discussed necessity of this and possibility of internal monitors to which patient was agreeable. Placed IUPC and patient pulled out monitor and then refused further exam. Patient's mother very upset and felt we were hurting patient. Patient agreeable to wear external monitors again. Patient's mother is declining epidural for patient.   [redacted]w[redacted]d, MD 4:01 AM

## 2019-08-04 NOTE — Progress Notes (Signed)
Labor Progress Note Bonnie Cobb is a 24 y.o. G2P0010 at [redacted]w[redacted]d presented for IOL for DFM.   S:  Comfortable.   O:  BP 130/77   Pulse 96   Temp 98.2 F (36.8 C) (Oral)   Resp 16   Ht 5\' 3"  (1.6 m)   Wt 123.7 kg   LMP 10/27/2018   SpO2 100%   BMI 48.32 kg/m  EFM: 140, moderate variability, pos accels, no decels, reactive Toco: q3-72m  SVE: Dilation: 5 Effacement (%): 60 Cervical Position: Posterior Station: -2 Presentation: Vertex Exam by:: [email or web address] MD  A/P: 24 y.o. G2P0010 [redacted]w[redacted]d here for IOL for DFM. 1. Labor: S/p FB and Cytotec. Cont Pitocin. Improvement in effacement. AROM with clear fluid at this exam. Anticipate SVD. 2. FWB: Cat I 3. Pain: per patient request  4. GBS pos; Vanc   [redacted]w[redacted]d, MD 12:44 AM

## 2019-08-04 NOTE — Lactation Note (Signed)
This note was copied from a baby's chart. Lactation Consultation Note  Patient Name: Bonnie Cobb QIONG'E Date: 08/04/2019 Reason for consult: Initial assessment;Term;Primapara;1st time breastfeeding  P1 mother whose infant is now 87 hours old.  This is a term baby at 40+1 weeks.  Baby was swaddled and asleep when I arrived.  Mother's main concern is that her baby would not breast feed due to being sleepy.  Much education provided regarding breast feeding basics, feeding expectations, STS, hand expression and feeding cues.  Reassured mother that this is typical behavior for a baby at this age.  Taught mother hand expression and she was able to express a few drops of colostrum.  Container provided and milk storage times reviewed.  Finger feeding demonstrated.  Mother's breasts are soft and non tender and nipples are short shafted.  Mother also concerned about nipple eversion.  Explained to her that, with her soft compressible breast, baby should be able to latch.  However, to ease her worries, I also provided breast shells and a manual pump to help evert nipples.  Mother happy and appreciative.  #24 flange size is appropriate at this time and mother is looking forward to practicing with her manual pump.  Encouraged to feed 8-12 times/24 hours or sooner if baby shows feeding cues.  Mother will hand express before/after feedings and feed back any EBM she obtains to baby.  Mother's support person is her mother who is with her now.  Suggested mother call her RN/LC for latch assistance as needed.  Mother verbalized understanding.    Mom made aware of O/P services, breastfeeding support groups, community resources, and our phone # for post-discharge questions.     Maternal Data Formula Feeding for Exclusion: Yes Reason for exclusion: Mother's choice to formula and breast feed on admission Has patient been taught Hand Expression?: Yes Does the patient have breastfeeding experience prior to this  delivery?: No  Feeding    LATCH Score                   Interventions    Lactation Tools Discussed/Used     Consult Status Consult Status: Follow-up Date: 08/05/19 Follow-up type: In-patient    Miloh Alcocer R Weslee Fogg 08/04/2019, 3:52 PM
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
## 2019-08-05 NOTE — Lactation Note (Signed)
This note was copied from a baby's chart. Lactation Consultation Note  Patient Name: Bonnie Cobb [id number] Date: 08/05/2019  Mom reports she had planned on formula feeding but her aunt talked to her about all of the benefits of breastnmilk so she decided to try and breastfeed.  Mom reports he will not latch.  Mom reports has not tried today. But tried yesterday and it felt like pins and needles.  Urged mom to pump with DEBP everytime she gives a bottle. Urged mom to call lactation when infant started cuing to assist with feeding.  Mom voiced understanding.  Urged to call lactation as needed.     Maternal Data    Feeding Feeding Type: Bottle Fed - Formula Nipple Type: Slow - flow  LATCH Score                   Interventions    Lactation Tools Discussed/Used     Consult Status      Bonnie Cobb 08/05/2019, 3:57 PM
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
## 2019-08-06 ENCOUNTER — Encounter: Payer: Medicaid Other | Admitting: Obstetrics and Gynecology

## 2019-08-06 LAB — TYPE AND SCREEN
ABO/RH(D): A POS
Antibody Screen: POSITIVE
Donor AG Type: NEGATIVE
Donor AG Type: NEGATIVE
Unit division: 0
Unit division: 0

## 2019-08-06 LAB — BPAM RBC
Blood Product Expiration Date: 202106072359
Blood Product Expiration Date: 202106092359
ISSUE DATE / TIME: 202105151521
Unit Type and Rh: 6200
Unit Type and Rh: 6200

## 2019-08-06 MED ORDER — IBUPROFEN 600 MG PO TABS: 600.0000 mg | ORAL_TABLET | Freq: Four times a day (QID) | ORAL | 0 refills | Status: DC

## 2019-08-06 MED ORDER — POLYETHYLENE GLYCOL 3350 17 G PO PACK: 17.0000 g | PACK | Freq: Two times a day (BID) | ORAL | 0 refills | Status: DC

## 2019-08-06 MED ORDER — SENNOSIDES-DOCUSATE SODIUM 8.6-50 MG PO TABS: 2.0000 | ORAL_TABLET | ORAL | 0 refills | Status: DC

## 2019-08-06 MED ORDER — POLYETHYLENE GLYCOL 3350 17 G PO PACK
17.0000 g | PACK | Freq: Two times a day (BID) | ORAL | Status: DC
  Filled 2019-08-06: qty 1

## 2019-08-06 NOTE — Lactation Note (Signed)
This note was copied from a baby's chart. Lactation Consultation Note  Patient Name: Bonnie Cobb [id number] Date: 08/06/2019   Mom denies needs upon DC.  She states she is bottle feeding while working on infant's latch.  She is aware of OP services, phone line, and BF/pumping support groups.  Phone numbers/website provided for all services.  Engorgement prevention covered.    All questions answered.  Maternal Data    Feeding Nipple Type: Slow - flow  LATCH Score                   Interventions    Lactation Tools Discussed/Used     Consult Status      [name] [facility] 08/06/2019, 3:46 PM
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
## 2019-08-06 NOTE — Discharge Instructions (Signed)

## 2019-08-08 ENCOUNTER — Inpatient Hospital Stay (HOSPITAL_COMMUNITY)
Admission: AD | Admit: 2019-08-08 | Payer: Medicaid Other | Source: Home / Self Care | Admitting: Obstetrics and Gynecology

## 2019-08-08 ENCOUNTER — Inpatient Hospital Stay (HOSPITAL_COMMUNITY): Payer: Medicaid Other

## 2019-09-03 ENCOUNTER — Other Ambulatory Visit: Payer: Self-pay

## 2019-09-03 ENCOUNTER — Other Ambulatory Visit (HOSPITAL_COMMUNITY)
Admission: RE | Admit: 2019-09-03 | Discharge: 2019-09-03 | Disposition: A | Discharge status: Home / Self Care | Payer: Medicaid Other | Source: Ambulatory Visit | Attending: Obstetrics and Gynecology | Admitting: Obstetrics and Gynecology

## 2019-09-03 ENCOUNTER — Ambulatory Visit (INDEPENDENT_AMBULATORY_CARE_PROVIDER_SITE_OTHER): Payer: Medicaid Other | Admitting: Obstetrics and Gynecology

## 2019-09-03 ENCOUNTER — Encounter: Payer: Self-pay | Admitting: Obstetrics and Gynecology

## 2019-09-03 DIAGNOSIS — A599 Trichomoniasis, unspecified: Secondary | ICD-10-CM | POA: Diagnosis present

## 2019-09-03 DIAGNOSIS — N898 Other specified noninflammatory disorders of vagina: Secondary | ICD-10-CM | POA: Diagnosis not present

## 2019-09-03 DIAGNOSIS — Z1389 Encounter for screening for other disorder: Secondary | ICD-10-CM | POA: Diagnosis not present

## 2019-09-03 NOTE — Progress Notes (Signed)
    Post Partum Visit Note  Bonnie Cobb is a 24 y.o. G6P1011 female who presents for a postpartum visit. She is 4 weeks postpartum following a normal spontaneous vaginal delivery.  I have fully reviewed the prenatal and intrapartum course. The delivery was at [redacted]w[redacted]d gestational weeks.  Anesthesia: none. Postpartum course has been uncomfortable d/t barth cyst . Baby is doing well. Baby is feeding by bottle - Gerber Gentle. Bleeding spotting. Bowel function is normal. Bladder function is normal. Patient is not sexually active. Contraception method is none. Postpartum depression screening: negative. EPDS = 6  The following portions of the patient's history were reviewed and updated as appropriate: allergies, current medications, past family history, past medical history, past social history, past surgical history and problem list.  Review of Systems Constitutional: negative Eyes: negative Ears, nose, mouth, throat, and face: negative Respiratory: negative Cardiovascular: negative Gastrointestinal: negative Genitourinary:positive for bartholin's abscess Integument/breast: negative Hematologic/lymphatic: negative Musculoskeletal:negative Neurological: negative Behavioral/Psych: negative Endocrine: negative Allergic/Immunologic: negative    Objective:  Blood pressure 121/81, pulse 95, weight 246 lb 3.2 oz (111.7 kg), last menstrual period 10/27/2018, unknown if currently breastfeeding.  General:  alert, cooperative and no distress   Breasts:  not examined  Lungs: clear to auscultation bilaterally  Heart:  regular rate and rhythm, S1, S2 normal, no murmur, click, rub or gallop  Abdomen: soft, non-tender; bowel sounds normal; no masses,  no organomegaly   Vulva:  not evaluated Patient declined  Vagina: not evaluated Patient declined  Cervix:  not evaluated Patient declined  Corpus: not examined Patient declined  Adnexa:  not evaluated Patient declined  Rectal Exam: Not performed.         Assessment:  1. Vaginal odor - Urine cytology ancillary only([place])  2. Trichomoniasis - TOC  - Urine cytology ancillary only([place])  3. Encounter for routine postpartum follow-up  - Normal postpartum exam. Pap smear not done at today's visit.  - Advised to call the office, if Bartholin's abscess enlarges and/or causes more problems Plan:   Essential components of care per ACOG recommendations:  1.  Mood and well being: Patient with positive depression screening today. Reviewed local resources for support. Willing to meet with Gwyndolyn Saxon, LCSW - Patient does not use tobacco.  - hx of drug use? No    2. Infant care and feeding:  -Patient currently breastmilk feeding? No  -Social determinants of health (SDOH) reviewed in EPIC. No concerns.  3. Sexuality, contraception and birth spacing - Patient does not want a pregnancy in the next year.  Desired family size is 1 children.  - Reviewed forms of contraception in tiered fashion. Patient desired abstinence today.   - Discussed birth spacing of 18 months  4. Sleep and fatigue -Encouraged family/partner/community support of 4 hrs of uninterrupted sleep to help with mood and fatigue  5. Physical Recovery  - Discussed patients delivery and complications - Patient had no laceration, perineal healing reviewed. Patient expressed understanding - Patient has urinary incontinence? No* - Patient is safe to resume physical and sexual activity  6.  Health Maintenance - Last pap smear done 07/09/2019 and was normal with negative HPV. Mammogram: N/A  7. Chronic Disease - PCP follow up  Raelyn Mora, CNM [facility] [employer] [facility], [facility]

## 2019-09-07 LAB — URINE CYTOLOGY ANCILLARY ONLY
Bacterial Vaginitis-Urine: NEGATIVE
Candida Urine: NEGATIVE
Chlamydia: NEGATIVE
Comment: NEGATIVE
Comment: NEGATIVE
Comment: NORMAL
Neisseria Gonorrhea: NEGATIVE
Trichomonas: NEGATIVE

## 2019-09-08 ENCOUNTER — Encounter: Payer: Self-pay | Admitting: Obstetrics and Gynecology

## 2019-09-09 ENCOUNTER — Ambulatory Visit (INDEPENDENT_AMBULATORY_CARE_PROVIDER_SITE_OTHER): Payer: Medicaid Other | Admitting: Licensed Clinical Social Worker

## 2019-09-09 ENCOUNTER — Encounter: Payer: Self-pay | Admitting: General Practice

## 2019-09-09 DIAGNOSIS — O99345 Other mental disorders complicating the puerperium: Secondary | ICD-10-CM | POA: Diagnosis not present

## 2019-09-09 DIAGNOSIS — F4322 Adjustment disorder with anxiety: Secondary | ICD-10-CM

## 2019-09-09 NOTE — BH Specialist Note (Signed)
Integrated Behavioral Health Follow Up Visit  MRN: [id number] Name: Bonnie Cobb  Number of Integrated Behavioral Health Clinician visits:  Session Start time: 10:00am  Session End time: 10:28am Total time: 28 mins via mychart Patient home; Provider   Type of Service: Integrated Behavioral Health- Individual Interpretor:no  Interpretor Name and Language: none   SUBJECTIVE: Bonnie Cobb is a 24 y.o. female accompanied by n/a Patient was referred by Bonnie Cobb CNM for mood check  Patient reports the following symptoms/concerns: occasional crying, anxiety regarding parenting  Duration of problem: postpartum  ; Severity of problem: mild   OBJECTIVE: Mood: good  and Affect: normal  Risk of harm to self or others: No risk of harm to self or others.   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 Bonnie Cobb reports a desire for newborn son Bonnie Cobb to receive [facility]. LCSW sent a message to practice social worker to contact Bonnie Cobb for new patient consult. Bonnie Cobb reports she is ready to return to work and her mother will provide childcare. Discussed with Bonnie Cobb to write down goal for the remainder for the year that is obtainable. Additionally, Bonnie Cobb discuss alleviating stress engaging stress reducing activities.   INTERVENTIONS: Interventions utilized:  Supportive Counseling  Standardized Assessments completed: edinburgh   ASSESSMENT: Patient currently experiencing adjustment disorder with anxiety   Patient may benefit from community wraparound resources. Referral sent during delivery   PLAN: 1. Follow up with behavioral health clinician on : as needed  2. Behavioral recommendations: Set goals for the remainder of the year, practice stress reducing  technique discussed, mindfulness technique and keep all medical and behavioral health appt.  3. Referral(s): none  4. "From scale of 1-10, how likely are you to follow plan?":   Bonnie Saxon, LCSW

## 2019-09-22 ENCOUNTER — Other Ambulatory Visit: Payer: Self-pay

## 2019-09-22 ENCOUNTER — Ambulatory Visit (HOSPITAL_COMMUNITY)
Admission: EM | Admit: 2019-09-22 | Discharge: 2019-09-22 | Disposition: A | Discharge status: Home / Self Care | Payer: Medicaid Other | Attending: Physician Assistant | Admitting: Physician Assistant

## 2019-09-22 ENCOUNTER — Encounter (HOSPITAL_COMMUNITY): Payer: Self-pay | Admitting: Emergency Medicine

## 2019-09-22 ENCOUNTER — Other Ambulatory Visit (HOSPITAL_COMMUNITY)
Admission: RE | Admit: 2019-09-22 | Discharge: 2019-09-22 | Disposition: A | Discharge status: Home / Self Care | Payer: Medicaid Other | Source: Ambulatory Visit | Attending: Obstetrics and Gynecology | Admitting: Obstetrics and Gynecology

## 2019-09-22 ENCOUNTER — Ambulatory Visit (INDEPENDENT_AMBULATORY_CARE_PROVIDER_SITE_OTHER): Payer: Medicaid Other | Admitting: *Deleted

## 2019-09-22 VITALS — BP 125/84 | HR 120 | Temp 98.1°F | Ht 63.0 in | Wt 250.0 lb

## 2019-09-22 DIAGNOSIS — Z113 Encounter for screening for infections with a predominantly sexual mode of transmission: Secondary | ICD-10-CM | POA: Insufficient documentation

## 2019-09-22 DIAGNOSIS — O23592 Infection of other part of genital tract in pregnancy, second trimester: Secondary | ICD-10-CM

## 2019-09-22 DIAGNOSIS — M654 Radial styloid tenosynovitis [de Quervain]: Secondary | ICD-10-CM

## 2019-09-22 DIAGNOSIS — Z3480 Encounter for supervision of other normal pregnancy, unspecified trimester: Secondary | ICD-10-CM

## 2019-09-22 MED ORDER — IBUPROFEN 600 MG PO TABS
600.0000 mg | ORAL_TABLET | Freq: Four times a day (QID) | ORAL | 0 refills | Status: DC | PRN
Start: 2019-09-22 — End: 2021-03-26

## 2019-09-22 NOTE — ED Triage Notes (Signed)
Left wrist pain. Patient does not know of any specific injury.    Patient is 8 wks post partum.  During pregnancy left middle and ring finger would go numb.    Patient is not breast feeding.

## 2019-09-22 NOTE — Discharge Instructions (Signed)
Wear the brace throughout the day and at night Take the ibuprofen every 6 hours for the next 3-4 days and then as needed  Follow up with sports medicine group if not improving in 1-2 weeks  Establish with primary care  Return if redness, lots of warmth or fever

## 2019-09-22 NOTE — Progress Notes (Signed)
   SUBJECTIVE:  24 y.o. female for general STD screening. Denies abnormal vaginal bleeding or significant pelvic pain or fever. No UTI symptoms. Denies history of known exposure to STD. Last sexual intercourse, 1 week ago unprotected.  Patient's last menstrual period was 09/11/2019.  OBJECTIVE:  She appears well, afebrile. Urine dipstick: not done.  ASSESSMENT:  Denies Vaginal Discharge  Denies Vaginal Odor   PLAN:  GC, chlamydia, trichomonas, BVAG, CVAG probe sent to lab. HIV/RRP Treatment: To be determined once lab results are received  Clovis Pu, RN

## 2019-09-22 NOTE — ED Provider Notes (Signed)
[facility]    CSN: [id number] Arrival date & time: 09/22/19  1430      History   Chief Complaint Chief Complaint  Patient presents with  . Wrist Injury    HPI Bonnie Cobb is a 24 y.o. female.   Patient reports for evaluation of ongoing left thumb and wrist pain.  She reports this started about 2 weeks prior to giving birth to her child.  She reports it is since gotten a lot worse.  She reports she uses the left and hold the baby a lot.  She reports it hurts to move the thumb.  She denies fever and chills.  She does not take anything for it.  She is exclusively giving formula and not breast-feeding.     Past Medical History:  Diagnosis Date  . ADHD (attention deficit hyperactivity disorder)   . Anemia affecting pregnancy in second trimester 04/02/2019   Rx for Iron sent   . Depression   . 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 No MFM consult needed. Just needs Type and Crossmatch in labor (in case of PPH etc).  . Maternal gonorrhea in second trimester 04/02/2019  . NSVD (normal spontaneous vaginal delivery)   . PTSD (post-traumatic stress disorder)     Patient Active Problem List   Diagnosis Date Noted  . Attention deficit hyperactivity disorder 08/02/2019  . Obesity 08/02/2019  . History of syphilis 08/02/2019  . Alpha thalassemia silent carrier 05/15/2019  . History of depression 04/01/2019    Past Surgical History:  Procedure Laterality Date  . NO PAST SURGERIES      OB History    Gravida  2   Para  1   Term  1   Preterm      AB  1   Living  1     SAB  1   TAB      Ectopic      Multiple  0   Live Births  1            Home Medications    Prior to Admission medications   Medication Sig Start Date End Date Taking? Authorizing Provider  ibuprofen (ADVIL)  600 MG tablet Take 1 tablet (600 mg total) by mouth every 6 (six) hours as needed. 09/22/19   Harmoney Sienkiewicz, Veryl Speak, PA-C    Family History Family History  Problem Relation Age of Onset  . Healthy Mother   . Healthy Father     Social History Social History   Tobacco Use  . Smoking status: Former Smoker    Types: Cigars  . Smokeless tobacco: Never Used  Vaping Use  . Vaping Use: Never used  Substance Use Topics  . Alcohol use: No  . Drug use: Not Currently    Types: Marijuana    Comment: Last smoked 05/30/2018     Allergies   Pineapple   Review of Systems Review of Systems   Physical Exam Triage Vital Signs ED Triage Vitals  Enc Vitals Group     BP 09/22/19 1516 120/82     Pulse Rate 09/22/19 1516 (!) 104     Resp 09/22/19 1516 20     Temp 09/22/19 1516 98.5 F (36.9 C)     Temp Source 09/22/19 1516 Oral     SpO2 09/22/19 1516 100 %     Weight --  Height --      Head Circumference --      Peak Flow --      Pain Score 09/22/19 1513 7     Pain Loc --      Pain Edu? --      Excl. in GC? --    No data found.  Updated Vital Signs BP 120/82 (BP Location: Right Arm) Comment (BP Location): large cuff  Pulse (!) 104   Temp 98.5 F (36.9 C) (Oral)   Resp 20   LMP 09/11/2019 (Exact Date)   SpO2 100%   Visual Acuity Right Eye Distance:   Left Eye Distance:   Bilateral Distance:    Right Eye Near:   Left Eye Near:    Bilateral Near:     Physical Exam Vitals and nursing note reviewed.  Musculoskeletal:     Comments: Left hand and wrist :tenderness to palpation over the distal radius and proximal left thumb.  Pain with flexion of the thumb and fist with ulnar deviation, positive Finkelstein's.  Cap refill less than 2 seconds.  No decrease in sensation.  Remainder the hand without tenderness.      UC Treatments / Results  Labs (all labs ordered are listed, but only abnormal results are displayed) Labs Reviewed - No data to  display  EKG   Radiology No results found.  Procedures Procedures (including critical care time)  Medications Ordered in UC Medications - No data to display  Initial Impression / Assessment and Plan / UC Course  I have reviewed the triage vital signs and the nursing notes.  Pertinent labs & imaging results that were available during my care of the patient were reviewed by me and considered in my medical decision making (see chart for details).     #De Quervain's tenosynovitis Patient is 14 year old recent mother with what appears to be de Quervain's tenosynovitis.  Physical exam very consistent with this.  Will place in thumb spica placed on NSAIDs.  Sports medicine follow-up if not improving after a week.  Primary care follow-up option given.  Patient verbalized understanding plan of care. Final Clinical Impressions(s) / UC Diagnoses   Final diagnoses:  De Quervain's tenosynovitis, left     Discharge Instructions     Wear the brace throughout the day and at night Take the ibuprofen every 6 hours for the next 3-4 days and then as needed  Follow up with sports medicine group if not improving in 1-2 weeks  Establish with primary care  Return if redness, lots of warmth or fever     ED Prescriptions    Medication Sig Dispense Auth. Provider   ibuprofen (ADVIL) 600 MG tablet Take 1 tablet (600 mg total) by mouth every 6 (six) hours as needed. 30 tablet Liora Myles, Veryl Speak, PA-C     PDMP not reviewed this encounter.   Hermelinda Medicus, PA-C 09/22/19 1541

## 2019-09-23 ENCOUNTER — Ambulatory Visit: Payer: Medicaid Other | Admitting: Licensed Clinical Social Worker

## 2019-09-23 LAB — CERVICOVAGINAL ANCILLARY ONLY
Bacterial Vaginitis (gardnerella): NEGATIVE
Candida Glabrata: NEGATIVE
Candida Vaginitis: NEGATIVE
Chlamydia: NEGATIVE
Comment: NEGATIVE
Comment: NEGATIVE
Comment: NEGATIVE
Comment: NEGATIVE
Comment: NEGATIVE
Comment: NORMAL
Neisseria Gonorrhea: NEGATIVE
Trichomonas: NEGATIVE

## 2019-09-23 LAB — HIV ANTIBODY (ROUTINE TESTING W REFLEX): HIV Screen 4th Generation wRfx: NONREACTIVE

## 2019-09-23 LAB — RPR: RPR Ser Ql: NONREACTIVE

## 2019-11-04 ENCOUNTER — Encounter (HOSPITAL_COMMUNITY): Payer: Self-pay

## 2019-11-04 ENCOUNTER — Other Ambulatory Visit: Payer: Self-pay

## 2019-11-04 ENCOUNTER — Emergency Department (HOSPITAL_COMMUNITY): Payer: Medicaid Other

## 2019-11-04 ENCOUNTER — Emergency Department (HOSPITAL_COMMUNITY)
Admission: EM | Admit: 2019-11-04 | Discharge: 2019-11-05 | Disposition: A | Discharge status: Home / Self Care | Payer: Medicaid Other | Attending: Emergency Medicine | Admitting: Emergency Medicine

## 2019-11-04 DIAGNOSIS — N949 Unspecified condition associated with female genital organs and menstrual cycle: Secondary | ICD-10-CM | POA: Diagnosis present

## 2019-11-04 DIAGNOSIS — Z5321 Procedure and treatment not carried out due to patient leaving prior to being seen by health care provider: Secondary | ICD-10-CM | POA: Diagnosis not present

## 2019-11-04 DIAGNOSIS — N764 Abscess of vulva: Secondary | ICD-10-CM | POA: Insufficient documentation

## 2019-11-04 LAB — CBC WITH DIFFERENTIAL/PLATELET
Abs Immature Granulocytes: 0.04 10*3/uL (ref 0.00–0.07)
Basophils Absolute: 0 10*3/uL (ref 0.0–0.1)
Basophils Relative: 0 %
Eosinophils Absolute: 0.1 10*3/uL (ref 0.0–0.5)
Eosinophils Relative: 1 %
HCT: 33.5 % — ABNORMAL LOW (ref 36.0–46.0)
Hemoglobin: 10.1 g/dL — ABNORMAL LOW (ref 12.0–15.0)
Immature Granulocytes: 0 %
Lymphocytes Relative: 27 %
Lymphs Abs: 2.7 10*3/uL (ref 0.7–4.0)
MCH: 22 pg — ABNORMAL LOW (ref 26.0–34.0)
MCHC: 30.1 g/dL (ref 30.0–36.0)
MCV: 72.8 fL — ABNORMAL LOW (ref 80.0–100.0)
Monocytes Absolute: 0.6 10*3/uL (ref 0.1–1.0)
Monocytes Relative: 6 %
Neutro Abs: 6.6 10*3/uL (ref 1.7–7.7)
Neutrophils Relative %: 66 %
Platelets: 425 10*3/uL — ABNORMAL HIGH (ref 150–400)
RBC: 4.6 MIL/uL (ref 3.87–5.11)
RDW: 17.6 % — ABNORMAL HIGH (ref 11.5–15.5)
WBC: 10.1 10*3/uL (ref 4.0–10.5)
nRBC: 0 % (ref 0.0–0.2)

## 2019-11-04 LAB — COMPREHENSIVE METABOLIC PANEL
ALT: 18 U/L (ref 0–44)
AST: 17 U/L (ref 15–41)
Albumin: 3.8 g/dL (ref 3.5–5.0)
Alkaline Phosphatase: 75 U/L (ref 38–126)
Anion gap: 10 (ref 5–15)
BUN: 6 mg/dL (ref 6–20)
CO2: 25 mmol/L (ref 22–32)
Calcium: 8.9 mg/dL (ref 8.9–10.3)
Chloride: 104 mmol/L (ref 98–111)
Creatinine, Ser: 0.64 mg/dL (ref 0.44–1.00)
GFR calc Af Amer: >60 mL/min (ref >60)
GFR calc non Af Amer: >60 mL/min (ref >60)
Glucose, Bld: 93 mg/dL (ref 70–99)
Potassium: 3.6 mmol/L (ref 3.5–5.1)
Sodium: 139 mmol/L (ref 135–145)
Total Bilirubin: 0.2 mg/dL — ABNORMAL LOW (ref 0.3–1.2)
Total Protein: 7.6 g/dL (ref 6.5–8.1)

## 2019-11-04 LAB — PROTIME-INR
INR: 1.1 (ref 0.8–1.2)
Prothrombin Time: 13.7 seconds (ref 11.4–15.2)

## 2019-11-04 LAB — LACTIC ACID, PLASMA: Lactic Acid, Venous: 1.1 mmol/L (ref 0.5–1.9)

## 2019-11-04 LAB — I-STAT BETA HCG BLOOD, ED (MC, WL, AP ONLY): I-stat hCG, quantitative: <5 m[IU]/mL (ref <5)

## 2019-11-04 MED ORDER — ACETAMINOPHEN 500 MG PO TABS: 1000.0000 mg | ORAL_TABLET | Freq: Once | ORAL | Status: DC

## 2019-11-04 NOTE — ED Triage Notes (Signed)
Emergency Medicine Provider Triage Evaluation Note  Bonnie Cobb , a 24 y.o. female  was evaluated in triage.  Pt complains of labia pain, drainage.  Review of Systems  Positive: Labia abscess, fever Negative: Abd pain  Physical Exam  BP 127/87 (BP Location: Left Arm)   Pulse (!) 124   Temp (!) 100.9 F (38.3 C) (Oral)   Ht 5\' 3"  (1.6 m)   Wt 114 kg   SpO2 100%   BMI 44.52 kg/m  Gen:   Awake, no distress   HEENT:  Atraumatic  Resp:  Normal effort  Cardiac:  Normal rate  Abd:   Nondistended, nontender  MSK:   Moves extremities without difficulty  Neuro:  Speech clear  GU: chaperoned by Annnie RN - on brief external exam, there is tenderness and fullness in the left labia  Medical Decision Making  Medically screening exam initiated at 10:43 PM.  Appropriate orders placed.  was informed that the remainder of the evaluation will be completed by another provider, this initial triage assessment does not replace that evaluation, and the importance of remaining in the ED until their evaluation is complete.   Concern for labial abscess; no superficial abscess that is easily drainable at present, will get CT pelvis given tachycardia and fever  Clinical Impression  Labia abscess, fever   [name], MD 11/04/19 2246

## 2019-11-04 NOTE — ED Triage Notes (Signed)
Pt arrives POV for eval of vaginal pain after an abscess burst to R labia. Pt reports golf ball sized abscess, which ruptured while in the waiting room. PT reports she recently got first dose of vaccine on Monday.

## 2019-11-05 ENCOUNTER — Emergency Department (HOSPITAL_COMMUNITY): Payer: Medicaid Other

## 2019-11-05 MED ORDER — IOHEXOL 300 MG/ML  SOLN
100.0000 mL | Freq: Once | INTRAMUSCULAR | Status: AC | PRN
  Administered 2019-11-05: 100 mL via INTRAVENOUS

## 2019-11-05 NOTE — ED Notes (Signed)
LWBS 

## 2019-11-10 LAB — CULTURE, BLOOD (ROUTINE X 2)
Culture: NO GROWTH
Culture: NO GROWTH
Special Requests: ADEQUATE
Special Requests: ADEQUATE

## 2019-11-23 IMAGING — US US OB COMP LESS 14 WK
1 series · 15 of 20 positions shown · non-contrast
Comparison: Ultrasound dated 06/02/2018

CLINICAL DATA: 23-year-old pregnant female with abdominal cramping.
LMP: 10/27/2018 corresponding to an estimated gestational age of 10
weeks, 0 days.

EXAM:
OBSTETRIC <14 WK ULTRASOUND
TECHNIQUE: Transabdominal ultrasound was performed for evaluation of the
gestation as well as the maternal uterus and adnexal regions.

[Series 1: us ob comp less 14 wk · 20 acquisitions, 15 frames shown]
[im 1/20]
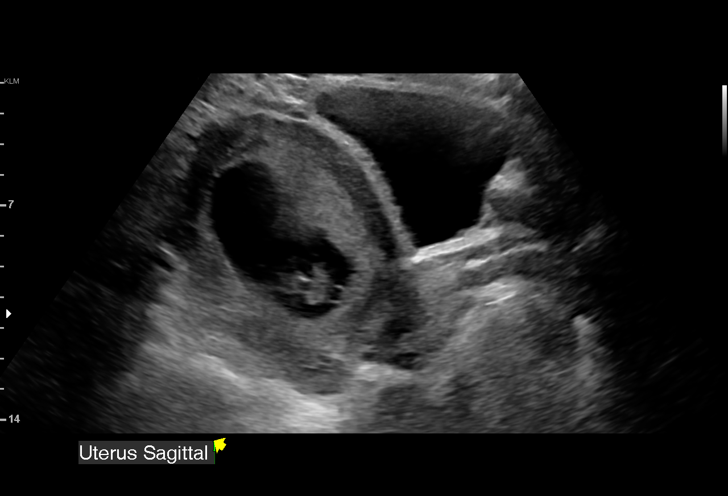
[im 3/20]
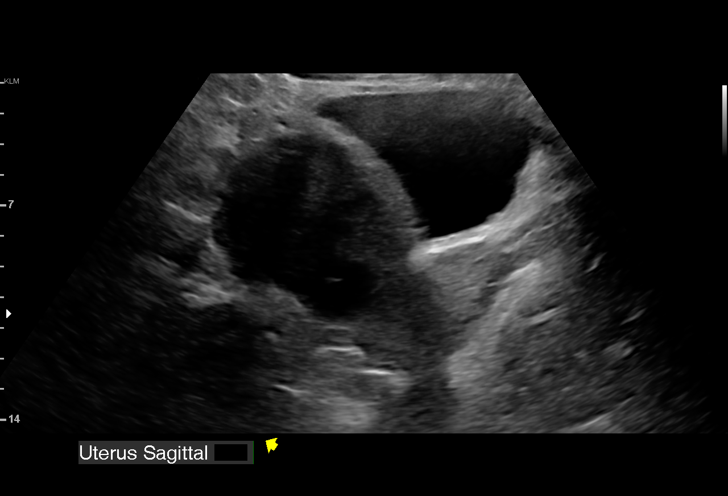
[im 4/20]
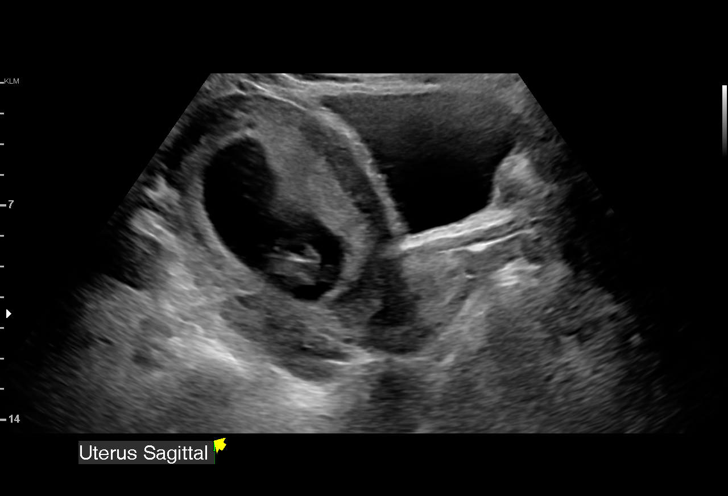
[im 5/20]
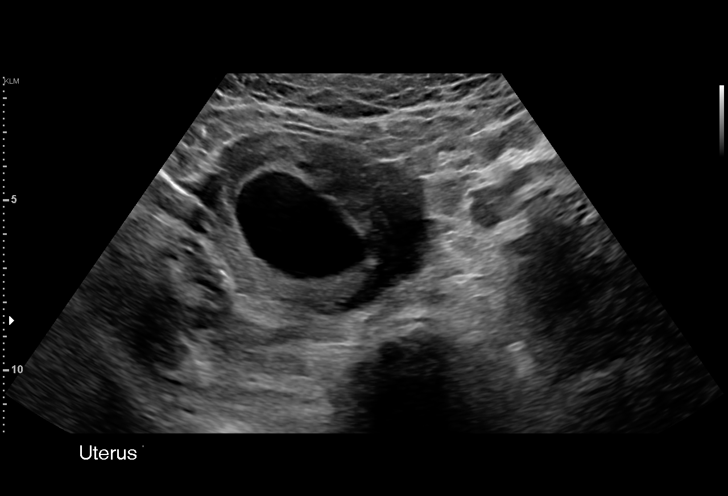
[im 7/20]
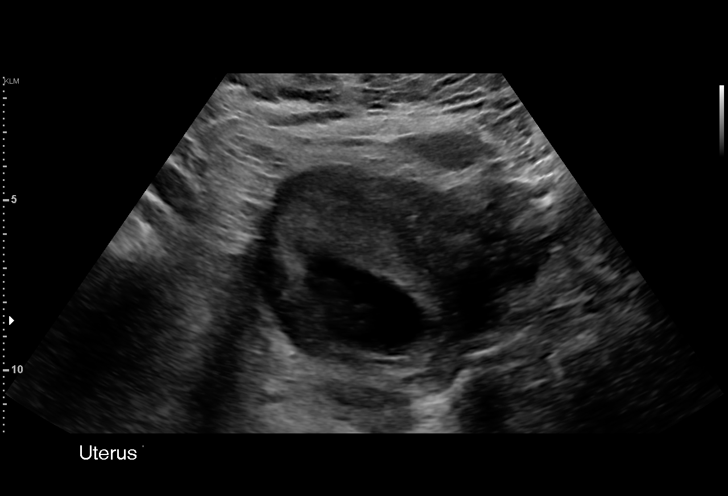
[im 8/20]
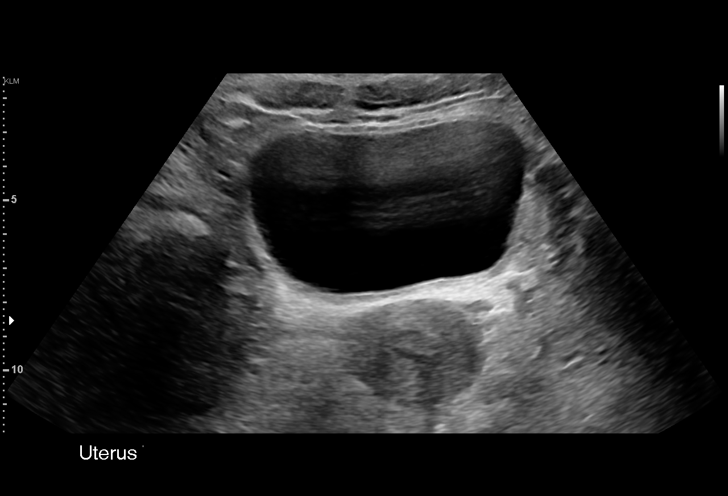
[im 9/20]
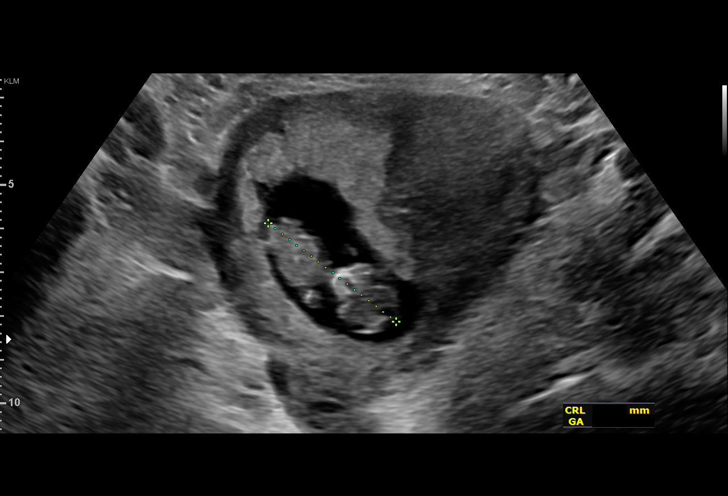
[im 11/20]
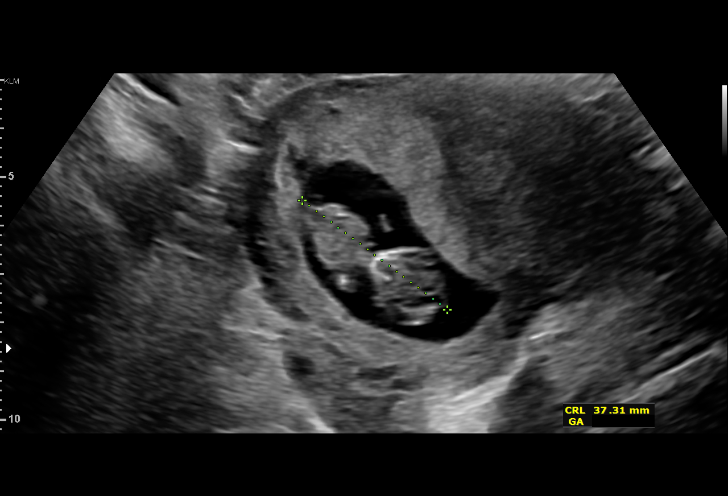
[im 12/20]
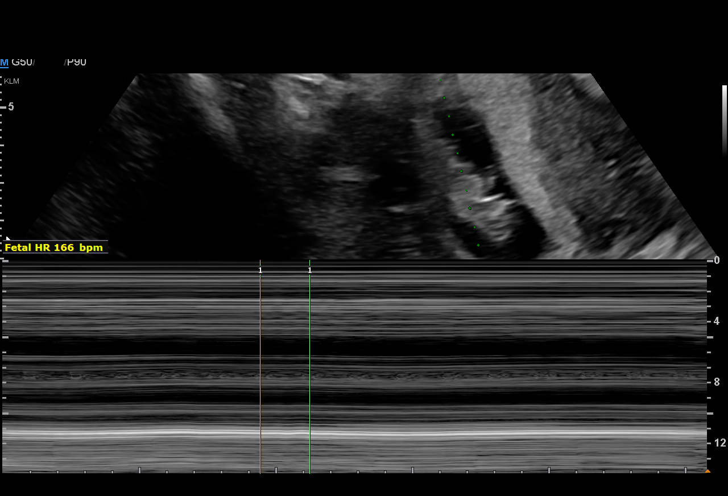
[im 13/20]
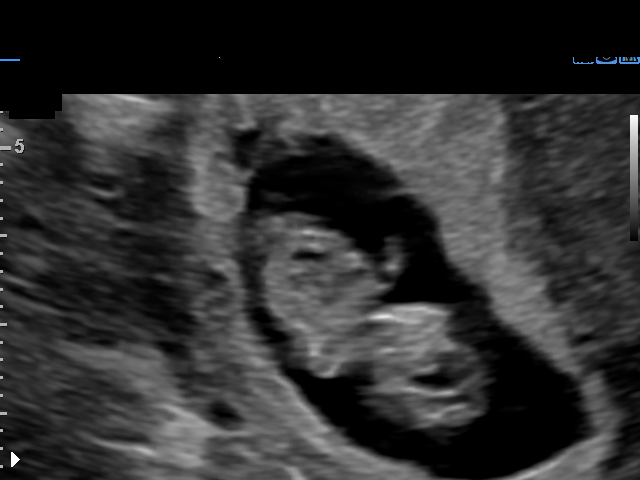
[im 15/20]
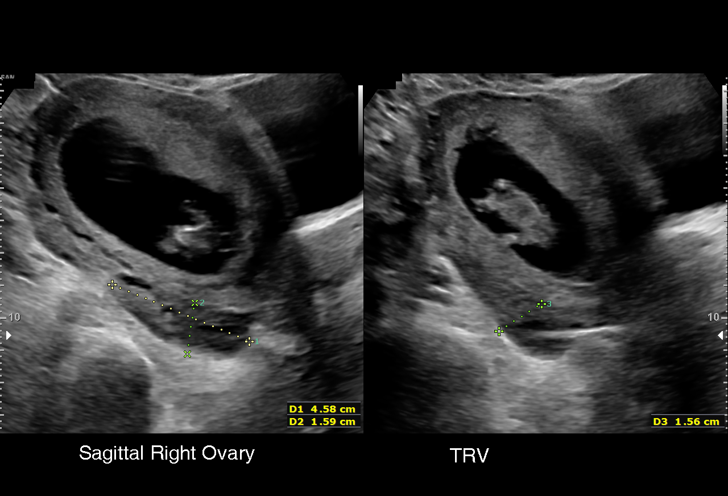
[im 16/20]
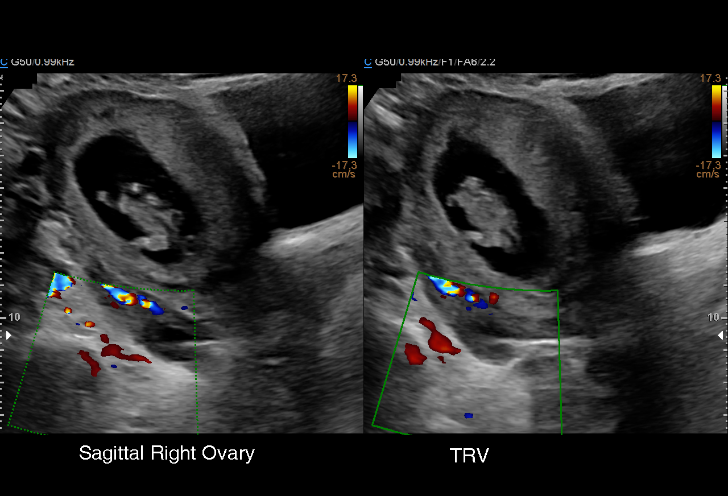
[im 17/20]
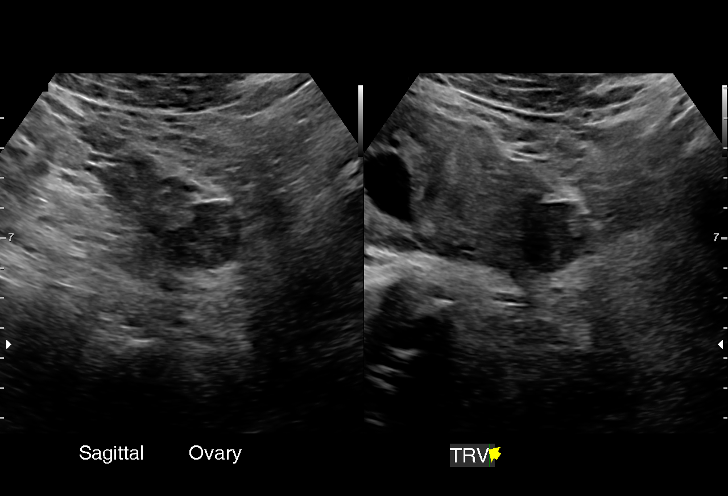
[im 19/20]
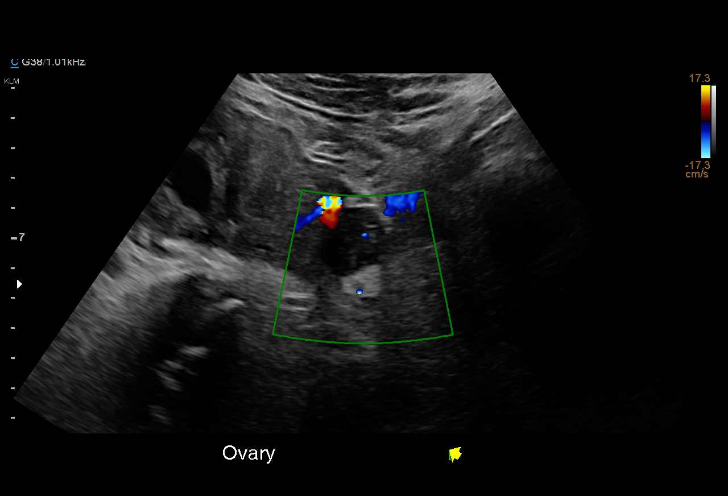
[im 20/20]
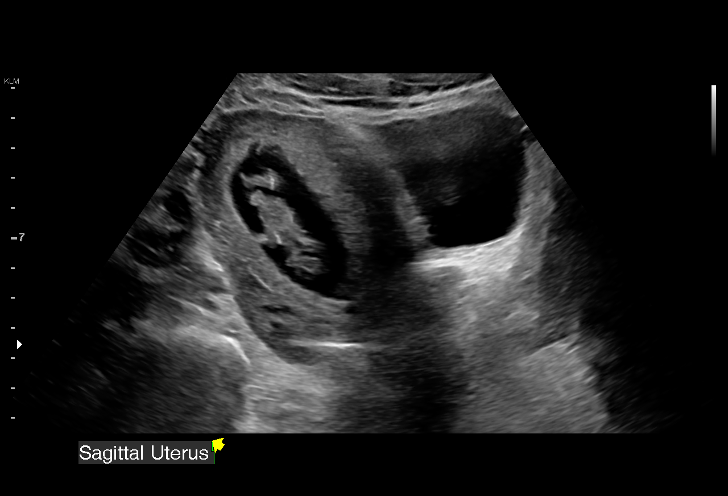

[15 of 20 positions shown; findings below may reference images not displayed]

FINDINGS: Intrauterine gestational sac: Single intrauterine gestational sac.

Yolk sac:  Not seen

Embryo:  Present

Cardiac Activity: Detected

Heart Rate: 166 bpm

CRL: 38 mm   10 w 4 d                  US EDC: 07/30/2018

Subchorionic hemorrhage:  None visualized.

Maternal uterus/adnexae: The maternal ovaries appear unremarkable.

No free fluid within the pelvis.
IMPRESSION: Single live intrauterine pregnancy with an estimated gestational age
of 10 weeks, 4 days.

## 2020-01-05 ENCOUNTER — Ambulatory Visit (INDEPENDENT_AMBULATORY_CARE_PROVIDER_SITE_OTHER): Payer: Medicaid Other | Admitting: Primary Care

## 2020-02-02 ENCOUNTER — Ambulatory Visit: Payer: Medicaid Other

## 2020-02-03 ENCOUNTER — Other Ambulatory Visit: Payer: Self-pay

## 2020-02-03 ENCOUNTER — Ambulatory Visit (INDEPENDENT_AMBULATORY_CARE_PROVIDER_SITE_OTHER): Payer: Medicaid Other | Admitting: *Deleted

## 2020-02-03 ENCOUNTER — Other Ambulatory Visit (HOSPITAL_COMMUNITY)
Admission: RE | Admit: 2020-02-03 | Discharge: 2020-02-03 | Disposition: A | Discharge status: Home / Self Care | Payer: Medicaid Other | Source: Ambulatory Visit | Attending: Obstetrics and Gynecology | Admitting: Obstetrics and Gynecology

## 2020-02-03 VITALS — BP 134/86 | HR 109 | Temp 99.0°F | Ht 63.0 in | Wt 266.0 lb

## 2020-02-03 DIAGNOSIS — N898 Other specified noninflammatory disorders of vagina: Secondary | ICD-10-CM | POA: Diagnosis present

## 2020-02-03 DIAGNOSIS — Z113 Encounter for screening for infections with a predominantly sexual mode of transmission: Secondary | ICD-10-CM | POA: Diagnosis present

## 2020-02-03 DIAGNOSIS — B001 Herpesviral vesicular dermatitis: Secondary | ICD-10-CM

## 2020-02-03 MED ORDER — VALACYCLOVIR HCL 1 G PO TABS: 2000.0000 mg | ORAL_TABLET | Freq: Once | ORAL | 12 refills | Status: AC
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
## 2020-02-04 LAB — RPR: RPR Ser Ql: NONREACTIVE

## 2020-02-04 LAB — HIV ANTIBODY (ROUTINE TESTING W REFLEX): HIV Screen 4th Generation wRfx: NONREACTIVE

## 2020-02-05 LAB — CERVICOVAGINAL ANCILLARY ONLY
Bacterial Vaginitis (gardnerella): POSITIVE — AB
Candida Glabrata: NEGATIVE
Candida Vaginitis: NEGATIVE
Chlamydia: POSITIVE — AB
Comment: NEGATIVE
Comment: NEGATIVE
Comment: NEGATIVE
Comment: NEGATIVE
Comment: NEGATIVE
Comment: NORMAL
Neisseria Gonorrhea: POSITIVE — AB
Trichomonas: NEGATIVE

## 2020-02-08 ENCOUNTER — Telehealth: Payer: Self-pay | Admitting: *Deleted

## 2020-02-08 DIAGNOSIS — A599 Trichomoniasis, unspecified: Secondary | ICD-10-CM

## 2020-02-08 DIAGNOSIS — A749 Chlamydial infection, unspecified: Secondary | ICD-10-CM

## 2020-02-08 MED ORDER — DOXYCYCLINE HYCLATE 100 MG PO TBEC: 100.0000 mg | DELAYED_RELEASE_TABLET | Freq: Two times a day (BID) | ORAL | 0 refills | Status: AC

## 2020-02-08 MED ORDER — METRONIDAZOLE 500 MG PO TABS: 500.0000 mg | ORAL_TABLET | Freq: Two times a day (BID) | ORAL | 0 refills | Status: DC

## 2020-02-08 NOTE — Telephone Encounter (Signed)
-----   Message from Bonnie Cobb, [place] sent at 02/08/2020 10:57 AM EST ----- Please notify patient that she is positive for BV, GC & CT. Please treat for all of those as well. Thanks!

## 2020-02-20 ENCOUNTER — Other Ambulatory Visit: Payer: Self-pay

## 2020-02-20 ENCOUNTER — Encounter (HOSPITAL_COMMUNITY): Payer: Self-pay

## 2020-02-20 ENCOUNTER — Emergency Department (HOSPITAL_COMMUNITY)
Admission: EM | Admit: 2020-02-20 | Discharge: 2020-02-21 | Disposition: A | Discharge status: Home / Self Care | Payer: Medicaid Other | Attending: Emergency Medicine | Admitting: Emergency Medicine

## 2020-02-20 DIAGNOSIS — M62830 Muscle spasm of back: Secondary | ICD-10-CM

## 2020-02-20 DIAGNOSIS — Z87891 Personal history of nicotine dependence: Secondary | ICD-10-CM | POA: Diagnosis not present

## 2020-02-20 DIAGNOSIS — M545 Low back pain, unspecified: Secondary | ICD-10-CM | POA: Diagnosis present

## 2020-02-20 MED ORDER — KETOROLAC TROMETHAMINE 30 MG/ML IJ SOLN
30.0000 mg | Freq: Once | INTRAMUSCULAR | Status: AC
  Administered 2020-02-21: 30 mg via INTRAMUSCULAR
  Filled 2020-02-20: qty 1

## 2020-02-20 MED ORDER — CYCLOBENZAPRINE HCL 10 MG PO TABS
10.0000 mg | ORAL_TABLET | Freq: Once | ORAL | Status: AC
  Administered 2020-02-21: 10 mg via ORAL
  Filled 2020-02-20: qty 1

## 2020-02-20 MED ORDER — CYCLOBENZAPRINE HCL 10 MG PO TABS: 10.0000 mg | ORAL_TABLET | Freq: Three times a day (TID) | ORAL | 0 refills | Status: DC | PRN

## 2020-02-20 NOTE — ED Triage Notes (Signed)
Pt reports lower back spasms for 2 days after heavy lifting at work.

## 2020-02-20 NOTE — ED Provider Notes (Signed)
[facility]-EMERGENCY DEPT Provider Note   CSN: [id number] Arrival date & time: 02/20/20  2231     History Chief Complaint  Patient presents with   Back Pain    Bonnie Cobb is a 24 y.o. female.  Patient presents to the ED with a chief complaint of low back pain.  She states that she has been having back spasms since lifting heavy boxes at work.  She denies any fevers or chills.  Denies having taken anything for the symptoms.  She states that the pain started 2 days ago.  It has progressively worsened. She denies numbness or tingling.  She denies bowel or bladder incontinence.  She reports that it feels like a spasm.  The history is provided by the patient. No language interpreter was used.       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 No MFM consult needed. Just needs Type and Crossmatch in labor (in case of PPH etc).   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   Attention deficit hyperactivity disorder 08/02/2019   Obesity 08/02/2019   History of syphilis 08/02/2019   Alpha thalassemia silent carrier 05/15/2019   History of depression 04/01/2019    Past Surgical History:  Procedure Laterality Date   NO PAST SURGERIES       OB History    Gravida  2   Para  1   Term  1   Preterm      AB  1   Living  1     SAB  1   IAB      Ectopic      Multiple  0   Live Births  1            Family History  Problem Relation Age of Onset   Healthy Mother    Healthy Father     Social History   Tobacco Use   Smoking status: Former Smoker    Types: Cigars   Smokeless tobacco: Never Used  Building services engineer Use: Never used  Substance Use Topics   Alcohol use: No   Drug use: Not Currently    Types: Marijuana    Comment: Last smoked 05/30/2018    Home Medications Prior to Admission medications   Medication Sig Start Date End Date Taking? Authorizing Provider  metroNIDAZOLE (FLAGYL) 500 MG tablet Take 1 tablet (500 mg total) by mouth 2 (two) times daily. Take with food. 02/08/20  Yes Raelyn Mora, CNM  cyclobenzaprine (FLEXERIL) 10 MG tablet Take 1 tablet (10 mg total) by mouth 3 (three) times daily as needed for muscle spasms. 02/20/20   Roxy Horseman, PA-C  ibuprofen (ADVIL) 600 MG tablet Take 1 tablet (600 mg total) by mouth every 6 (six) hours as needed. Patient not taking: Reported on 02/20/2020 09/22/19   Darr, Gerilyn Pilgrim, PA-C    Allergies    Pineapple  Review of Systems   Review of Systems  All other systems  reviewed and are negative.   Physical Exam Updated Vital Signs BP (!) 167/104 (BP Location: Left Arm)    Pulse 97    Temp 97.9 F (36.6 C) (Oral)    Resp 20    Ht 5\' 3"  (1.6 m)    Wt 122.5 kg    SpO2 98%    BMI 47.83 kg/m   Physical Exam Physical Exam  Constitutional: Pt appears well-developed and well-nourished. No distress.  HENT:  Head: Normocephalic and atraumatic.  Mouth/Throat: Oropharynx is clear and moist. No oropharyngeal exudate.  Eyes: Conjunctivae are normal.  Neck: Normal range of motion. Neck supple.  No meningismus Cardiovascular: Normal rate, regular rhythm and intact distal pulses.   Pulmonary/Chest: Effort normal and breath sounds normal. No respiratory distress. Pt has no wheezes.  Abdominal: Pt exhibits no distension Musculoskeletal:  Lumbar tender to palpation, no bony CTLS spine tenderness, deformity,  step-off, or crepitus Lymphadenopathy: Pt has no cervical adenopathy.  Neurological: Pt is alert and oriented Speech is clear and goal oriented, follows commands Normal 5/5 strength in upper and lower extremities bilaterally including dorsiflexion and plantar flexion Sensation intact Great toe extension intact Moves extremities without ataxia, coordination intact Normal gait Normal balance Skin: Skin is warm and dry. No rash noted. Pt is not diaphoretic. No erythema.  Psychiatric: Pt has a normal mood and affect. Behavior is normal.  Nursing note and vitals reviewed.  ED Results / Procedures / Treatments   Labs (all labs ordered are listed, but only abnormal results are displayed) Labs Reviewed - No data to display  EKG None  Radiology No results found.  Procedures Procedures (including critical care time)  Medications Ordered in ED Medications  ketorolac (TORADOL) 30 MG/ML injection 30 mg (has no administration in time range)  cyclobenzaprine (FLEXERIL) tablet 10 mg (has no administration in time range)    ED Course  I have reviewed the triage vital signs and the nursing notes.  Pertinent labs & imaging results that were available during my care of the patient were reviewed by me and considered in my medical decision making (see chart for details).    MDM Rules/Calculators/A&P                          Patient with back pain.    No neurological deficits and normal neuro exam.  Patient is ambulatory.  No loss of bowel or bladder control.  Doubt cauda equina.  Denies fever,  doubt epidural abscess or other lesion. Recommend back exercises, stretching, RICE, and will treat with a short course of flexeril.   Final Clinical Impression(s) / ED Diagnoses Final diagnoses:  Acute bilateral low back pain without sciatica  Muscle spasm of back    Rx / DC Orders ED Discharge Orders         Ordered    cyclobenzaprine (FLEXERIL) 10 MG tablet  3 times daily PRN         02/20/20 2355           14/11/21, PA-C 02/20/20 2357    [place], 14/11/21, MD 02/21/20 [phone]

## 2020-03-31 ENCOUNTER — Other Ambulatory Visit: Payer: Self-pay

## 2020-03-31 ENCOUNTER — Encounter (HOSPITAL_COMMUNITY): Payer: Self-pay

## 2020-03-31 ENCOUNTER — Ambulatory Visit (HOSPITAL_COMMUNITY)
Admission: EM | Admit: 2020-03-31 | Discharge: 2020-03-31 | Disposition: A | Discharge status: Home / Self Care | Payer: Medicaid Other | Attending: Family Medicine | Admitting: Family Medicine

## 2020-03-31 ENCOUNTER — Other Ambulatory Visit: Payer: Self-pay | Admitting: Family Medicine

## 2020-03-31 DIAGNOSIS — Z20822 Contact with and (suspected) exposure to covid-19: Secondary | ICD-10-CM | POA: Diagnosis not present

## 2020-03-31 DIAGNOSIS — Z79899 Other long term (current) drug therapy: Secondary | ICD-10-CM | POA: Diagnosis not present

## 2020-03-31 DIAGNOSIS — H6503 Acute serous otitis media, bilateral: Secondary | ICD-10-CM | POA: Diagnosis not present

## 2020-03-31 DIAGNOSIS — Z7952 Long term (current) use of systemic steroids: Secondary | ICD-10-CM | POA: Insufficient documentation

## 2020-03-31 DIAGNOSIS — Z87891 Personal history of nicotine dependence: Secondary | ICD-10-CM | POA: Insufficient documentation

## 2020-03-31 DIAGNOSIS — J3489 Other specified disorders of nose and nasal sinuses: Secondary | ICD-10-CM

## 2020-03-31 DIAGNOSIS — J3089 Other allergic rhinitis: Secondary | ICD-10-CM

## 2020-03-31 MED ORDER — FLUTICASONE PROPIONATE 50 MCG/ACT NA SUSP: 1.0000 | Freq: Two times a day (BID) | NASAL | 1 refills | Status: DC

## 2020-03-31 MED ORDER — PREDNISONE 20 MG PO TABS: 40.0000 mg | ORAL_TABLET | Freq: Every day | ORAL | 0 refills | Status: DC

## 2020-03-31 MED ORDER — CETIRIZINE HCL 10 MG PO TABS: 10.0000 mg | ORAL_TABLET | Freq: Every day | ORAL | 1 refills | Status: DC

## 2020-03-31 NOTE — ED Provider Notes (Signed)
[facility]    CSN: [id number] Arrival date & time: 03/31/20  1026      History   Chief Complaint Chief Complaint  Patient presents with  . Otalgia    HPI Bonnie Cobb is a 25 y.o. female.   Here today with almost a week of b/l ear pressure, popping, pain and rhinorrhea, post nasal drainage, and cough. Denies fever, chills, body aches, fatigue, CP, SOB. Known hx of allergic rhinitis not currently on anything for this. Was COVID tested last week, states that was negative. Using some ear ache drops with no benefit.      Past Medical History:  Diagnosis Date  . ADHD (attention deficit hyperactivity disorder)   . Anemia affecting pregnancy in second trimester 04/02/2019   Rx for Iron sent   . Depression   . 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 No MFM consult needed. Just needs Type and Crossmatch in labor (in case of PPH etc).  . Maternal gonorrhea in second trimester 04/02/2019  . NSVD (normal spontaneous vaginal delivery)   . PTSD (post-traumatic stress disorder)     Patient Active Problem List   Diagnosis Date Noted  . 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 Attention deficit hyperactivity disorder 08/02/2019  . Obesity 08/02/2019  . History of syphilis 08/02/2019  . Alpha thalassemia silent carrier 05/15/2019  . History of depression 04/01/2019    Past Surgical History:  Procedure Laterality Date  . NO PAST SURGERIES      OB History    Gravida  2   Para  1   Term  1   Preterm      AB  1   Living  1     SAB  1   IAB      Ectopic      Multiple  0   Live Births  1            Home Medications    Prior to Admission medications   Medication Sig Start Date End Date Taking? Authorizing Provider  cetirizine (ZYRTEC ALLERGY) 10 MG tablet Take 1 tablet (10 mg total) by  mouth daily. 03/31/20  Yes Particia Nearing, PA-C  fluticasone [facility]) 50 MCG/ACT nasal spray Place 1 spray into both nostrils 2 (two) times daily. 03/31/20  Yes Particia Nearing, PA-C  predniSONE (DELTASONE) 20 MG tablet Take 2 tablets (40 mg total) by mouth daily with breakfast. 03/31/20  Yes Particia Nearing, PA-C  cyclobenzaprine (FLEXERIL) 10 MG tablet Take 1 tablet (10 mg total) by mouth 3 (three) times daily as needed for muscle spasms. 02/20/20   Roxy Horseman, PA-C  ibuprofen (ADVIL) 600 MG tablet Take 1 tablet (600 mg total) by mouth every 6 (six) hours as needed. Patient not taking: Reported on 02/20/2020 09/22/19   Darr, Gerilyn Pilgrim, PA-C  metroNIDAZOLE (FLAGYL) 500 MG tablet Take 1 tablet (500 mg total) by mouth 2 (two) times daily. Take with food. 02/08/20   Raelyn Mora, CNM    Family History Family History  Problem Relation Age of Onset  . Healthy Mother   . Healthy Father     Social History Social History   Tobacco Use  . Smoking status: Former Smoker    Types: Cigars  . Smokeless tobacco: Never Used  Vaping Use  . Vaping Use: Never used  Substance Use Topics  .  Alcohol use: No  . Drug use: Not Currently    Types: Marijuana    Comment: Last smoked 05/30/2018     Allergies   Pineapple   Review of Systems Review of Systems PER HPI    Physical Exam Triage Vital Signs ED Triage Vitals  Enc Vitals Group     BP 03/31/20 1135 136/73     Pulse Rate 03/31/20 1135 92     Resp 03/31/20 1135 16     Temp 03/31/20 1135 98.4 F (36.9 C)     Temp Source 03/31/20 1135 Oral     SpO2 03/31/20 1135 99 %     Weight 03/31/20 1132 263 lb (119.3 kg)     Height --      Head Circumference --      Peak Flow --      Pain Score 03/31/20 1131 7     Pain Loc --      Pain Edu? --      Excl. in GC? --    No data found.  Updated Vital Signs BP 136/73 (BP Location: Right Arm)   Pulse 92   Temp 98.4 F (36.9 C) (Oral)   Resp 16   Wt 263 lb (119.3 kg)    LMP  (Within Weeks) Comment: Mid dec   SpO2 99%   BMI 46.59 kg/m   Visual Acuity Right Eye Distance:   Left Eye Distance:   Bilateral Distance:    Right Eye Near:   Left Eye Near:    Bilateral Near:     Physical Exam Vitals and nursing note reviewed.  Constitutional:      Appearance: Normal appearance. She is not ill-appearing.  HENT:     Head: Atraumatic.     Ears:     Comments: B/l middle ear effusion, no erythema, injection, purulence    Nose: Rhinorrhea present.     Mouth/Throat:     Mouth: Mucous membranes are moist.     Pharynx: Posterior oropharyngeal erythema present.  Eyes:     Extraocular Movements: Extraocular movements intact.     Conjunctiva/sclera: Conjunctivae normal.  Cardiovascular:     Rate and Rhythm: Normal rate and regular rhythm.     Heart sounds: Normal heart sounds.  Pulmonary:     Effort: Pulmonary effort is normal. No respiratory distress.     Breath sounds: Normal breath sounds. No wheezing or rales.  Musculoskeletal:        General: Normal range of motion.     Cervical back: Normal range of motion and neck supple.  Skin:    General: Skin is warm and dry.  Neurological:     Mental Status: She is alert and oriented to person, place, and time.  Psychiatric:        Mood and Affect: Mood normal.        Thought Content: Thought content normal.        Judgment: Judgment normal.      UC Treatments / Results  Labs (all labs ordered are listed, but only abnormal results are displayed) Labs Reviewed - No data to display  EKG   Radiology No results found.  Procedures Procedures (including critical care time)  Medications Ordered in UC Medications - No data to display  Initial Impression / Assessment and Plan / UC Course  I have reviewed the triage vital signs and the nursing notes.  Pertinent labs & imaging results that were available during my care of the patient were reviewed by  me and considered in my medical decision making  (see chart for details).     Suspect effusion from uncontrolled allergic rhinitis. Will do short burst of prednisone, start good allergy regimen with zyrtec and flonase. F/u if not resolving.   Final Clinical Impressions(s) / UC Diagnoses   Final diagnoses:  Bilateral acute serous otitis media, recurrence not specified  Rhinorrhea   Discharge Instructions   None    ED Prescriptions    Medication Sig Dispense Auth. Provider   predniSONE (DELTASONE) 20 MG tablet Take 2 tablets (40 mg total) by mouth daily with breakfast. 6 tablet Particia Nearing, PA-C   cetirizine (ZYRTEC ALLERGY) 10 MG tablet Take 1 tablet (10 mg total) by mouth daily. 30 tablet Particia Nearing, PA-C   fluticasone [facility]) 50 MCG/ACT nasal spray Place 1 spray into both nostrils 2 (two) times daily. 16 g Particia Nearing, [place]     PDMP not reviewed this encounter.   Particia Nearing, [place] 03/31/20 1231

## 2020-03-31 NOTE — ED Triage Notes (Signed)
Patient presents to Urgent Care with complaints of sore throat, bilateral ear pain, and headache x 1 week. Pt reports using OTC ear drops. Pt states she has a hx of ear infections. Treating discomfort with tylenol.    Denies fever, abdominal pain, diarrhea, or n/v.

## 2020-04-01 LAB — SARS CORONAVIRUS 2 (TAT 6-24 HRS): SARS Coronavirus 2: NEGATIVE

## 2020-04-12 ENCOUNTER — Other Ambulatory Visit: Payer: Self-pay

## 2020-04-12 ENCOUNTER — Ambulatory Visit: Payer: Medicaid Other

## 2020-04-12 ENCOUNTER — Ambulatory Visit (INDEPENDENT_AMBULATORY_CARE_PROVIDER_SITE_OTHER): Payer: Medicaid Other | Admitting: *Deleted

## 2020-04-12 VITALS — BP 129/84 | HR 98 | Temp 98.0°F | Wt 269.2 lb

## 2020-04-12 DIAGNOSIS — A549 Gonococcal infection, unspecified: Secondary | ICD-10-CM

## 2020-04-12 DIAGNOSIS — O3680X0 Pregnancy with inconclusive fetal viability, not applicable or unspecified: Secondary | ICD-10-CM

## 2020-04-12 DIAGNOSIS — A749 Chlamydial infection, unspecified: Secondary | ICD-10-CM | POA: Diagnosis not present

## 2020-04-12 MED ORDER — CEFTRIAXONE SODIUM 500 MG IJ SOLR
500.0000 mg | Freq: Once | INTRAMUSCULAR | Status: AC
  Administered 2020-04-12: 500 mg via INTRAMUSCULAR

## 2020-04-12 MED ORDER — DOXYCYCLINE HYCLATE 100 MG PO TABS: 100.0000 mg | ORAL_TABLET | Freq: Two times a day (BID) | ORAL | 0 refills | Status: DC

## 2020-04-12 MED ORDER — LIDOCAINE HCL 1 % IJ SOLN
1.0000 mL | Freq: Once | INTRAMUSCULAR | Status: AC
  Administered 2020-04-12: 1 mL

## 2020-04-12 MED ORDER — AZITHROMYCIN 500 MG PO TABS
1000.0000 mg | ORAL_TABLET | Freq: Once | ORAL | Status: AC
  Administered 2020-04-12: 1000 mg via ORAL
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
## 2020-04-13 LAB — BETA HCG QUANT (REF LAB): hCG Quant: <1 m[IU]/mL

## 2020-05-06 IMAGING — US US MFM OB FOLLOW-UP
1 series · 14 of 28 positions shown · non-contrast
Comparison: none

[Series 1: us mfm ob follow-up · 46 acquisitions, 14 frames shown]
[im 2/46]
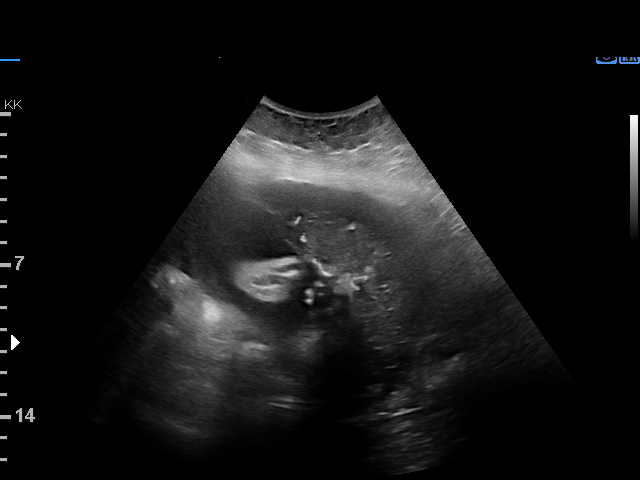
[im 6/46]
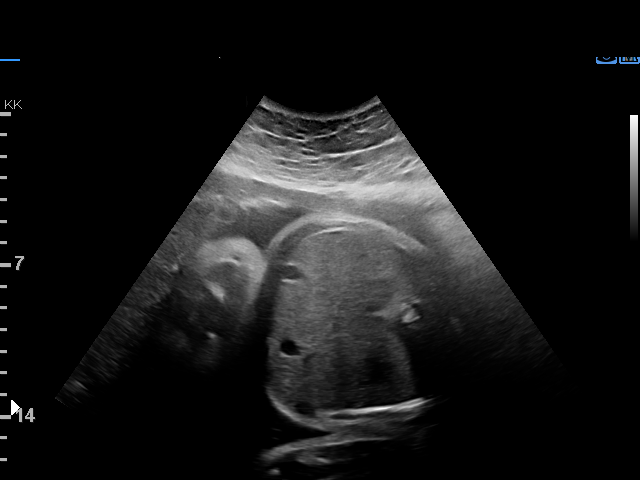
[im 9/46]
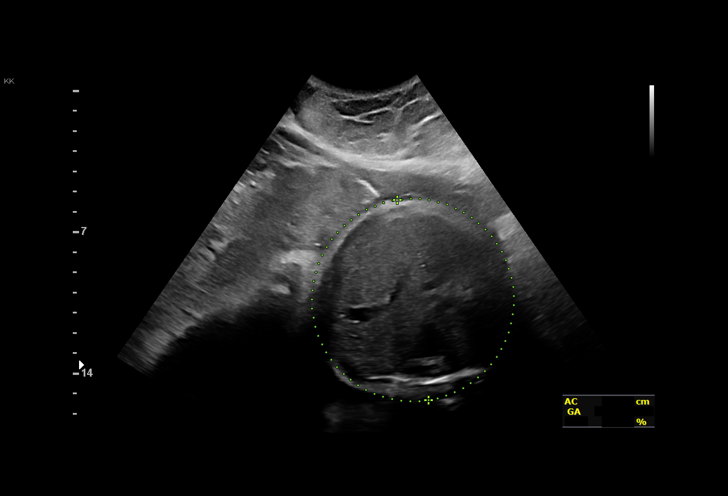
[im 12/46]
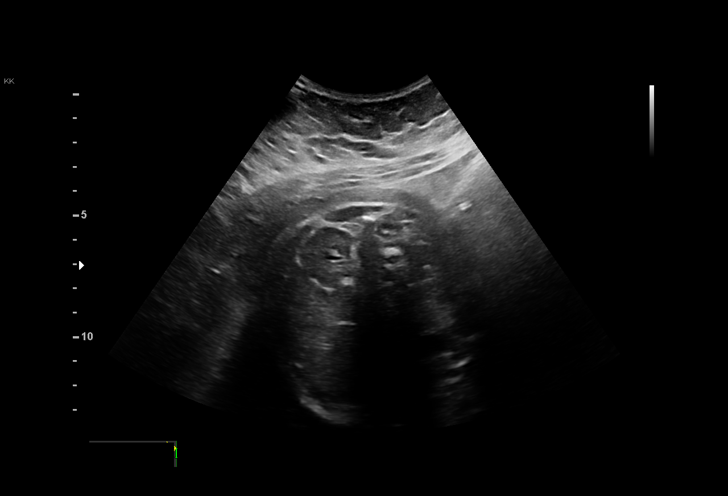
[im 16/46]
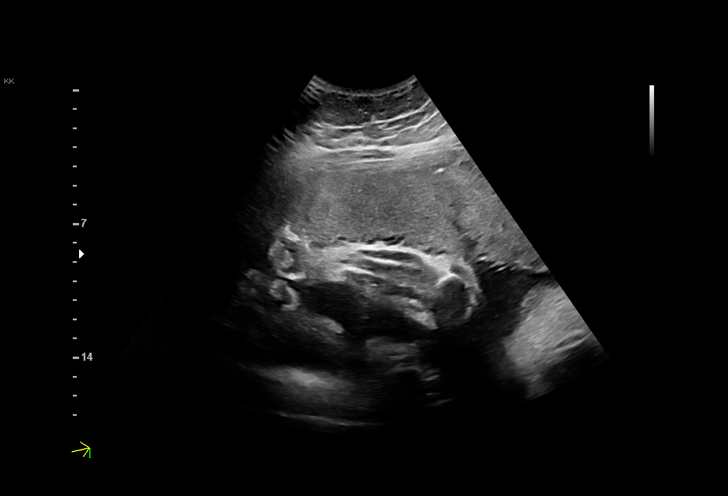
[im 19/46]
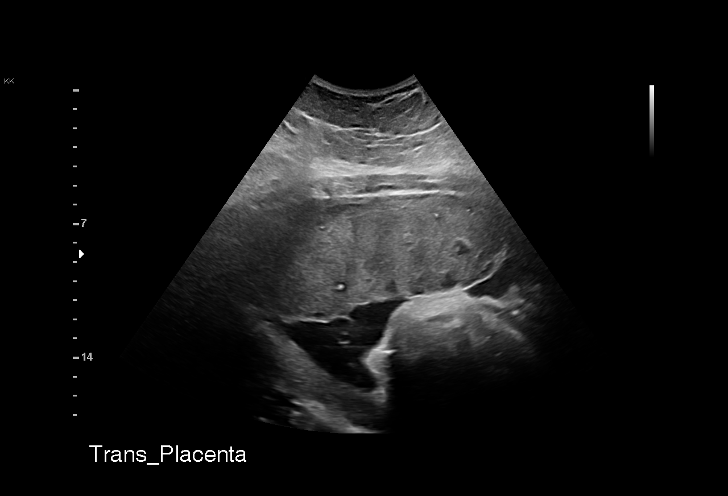
[im 22/46]
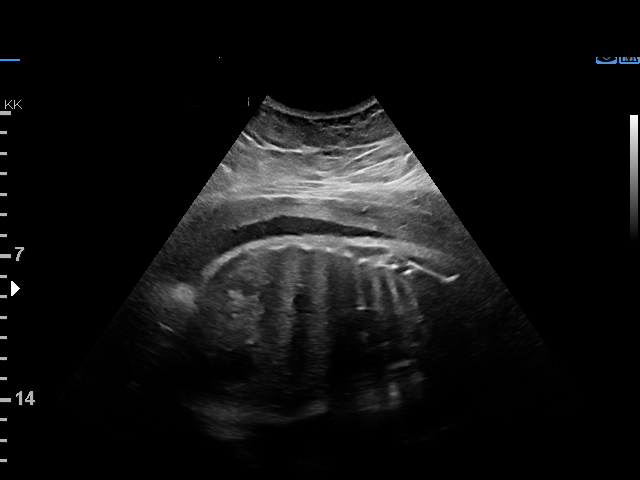
[im 26/46]
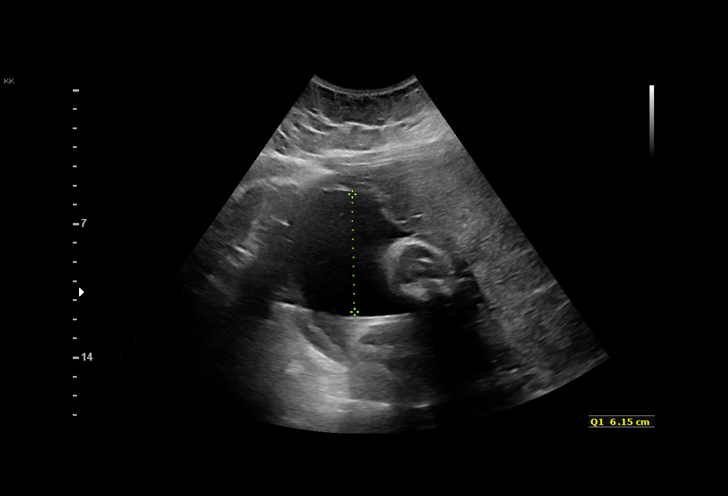
[im 29/46]
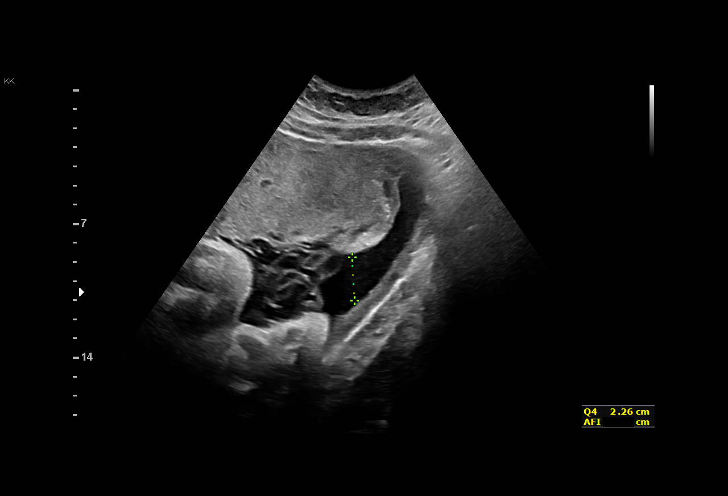
[im 32/46]
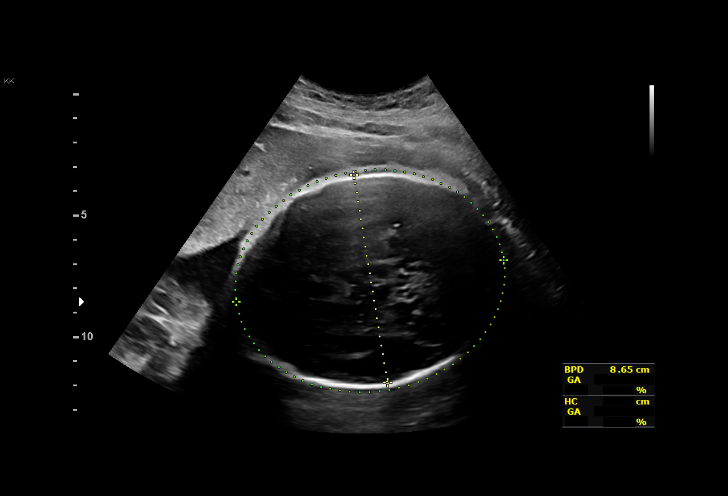
[im 36/46]
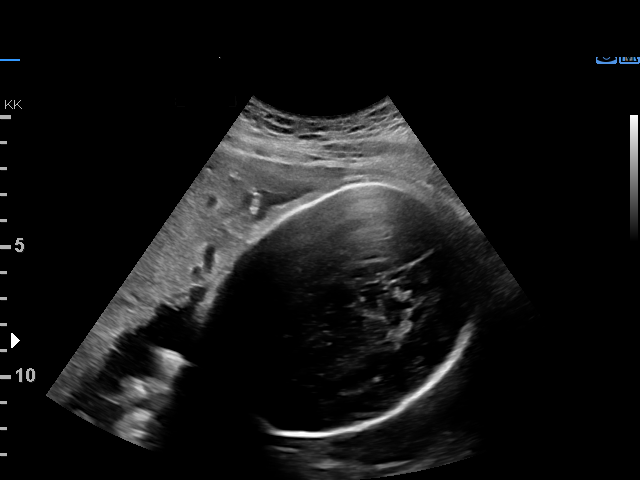
[im 39/46]
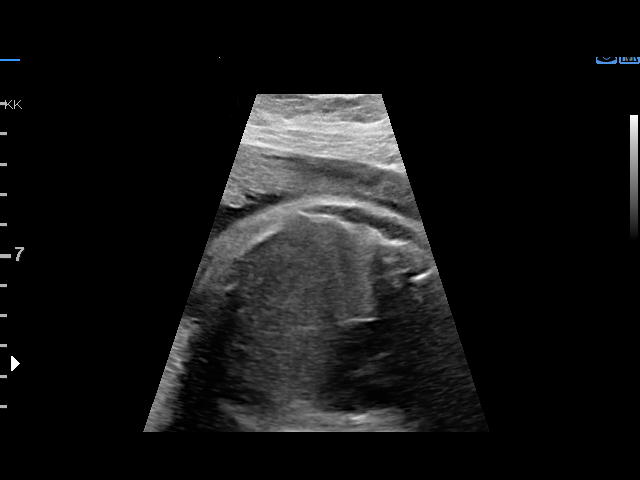
[im 42/46]
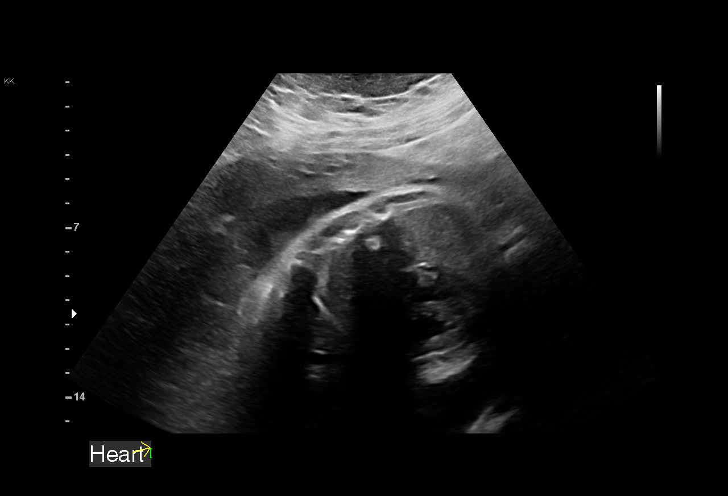
[im 46/46]
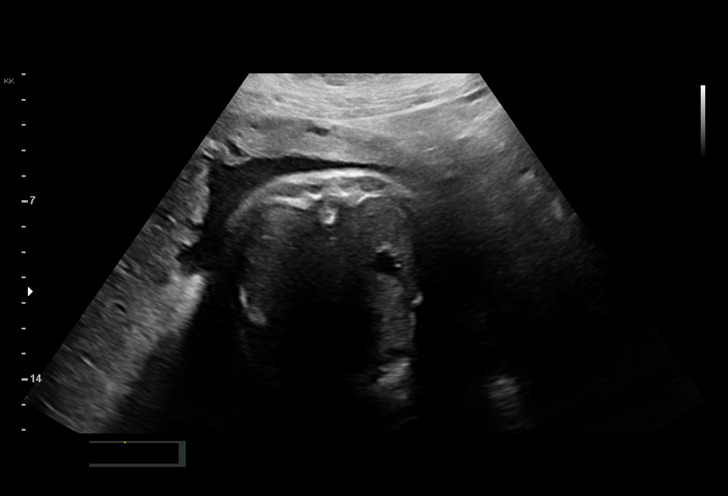

[14 of 28 positions shown; findings below may reference images not displayed]

CNM

 ----------------------------------------------------------------------

 ----------------------------------------------------------------------
Indications

  Uterine size-date discrepancy, third trimester
  Maternal morbid obesity
  Marijuana Use
  Late to prenatal care, third trimester
  Genetic carrier (Silent Carrier for Alpha
  Thalassemia)2
  Medical complication of pregnancy (Anti
  [place])
  33 weeks gestation of pregnancy
  Encounter for other antenatal screening
  follow-up
  Low Risk NIPS(Negative CF)
 ----------------------------------------------------------------------
Vital Signs

 BMI:
Fetal Evaluation

 Num Of Fetuses:         1
 Fetal Heart Rate(bpm):  146
 Cardiac Activity:       Observed
 Presentation:           Cephalic
 Placenta:               Anterior
 P. Cord Insertion:      Previously Visualized

 Amniotic Fluid
 AFI FV:      Within normal limits

 AFI Sum(cm)     %Tile       Largest Pocket(cm)
 13.79           47
 RUQ(cm)       RLQ(cm)       LUQ(cm)        LLQ(cm)

Biometry

 BPD:        87  mm     G. Age:  35w 1d         86  %    CI:        74.84   %    70 - 86
                                                         FL/HC:      19.7   %    19.4 -
 HC:      319.1  mm     G. Age:  36w 0d         75  %    HC/AC:      1.01        0.96 -
 AC:      317.2  mm     G. Age:  35w 5d         95  %    FL/BPD:     72.2   %    71 - 87
 FL:       62.8  mm     G. Age:  32w 4d         15  %    FL/AC:      19.8   %    20 - 24

 Est. FW:    7896  gm      5 lb 9 oz     79  %
OB History

 Gravidity:    2          SAB:   1
Gestational Age

 LMP:           33w 4d        Date:  10/27/18                 EDD:   08/03/19
 U/S Today:     34w 6d                                        EDD:   07/25/19
 Best:          33w 4d     Det. By:  LMP  (10/27/18)          EDD:   08/03/19
Anatomy

 Cranium:               Appears normal         LVOT:                   Previously seen
 Cavum:                 Previously seen        Aortic Arch:            Previously seen
 Ventricles:            Previously seen        Ductal Arch:            Previously seen
 Choroid Plexus:        Previously seen        Diaphragm:              Previously seen
 Cerebellum:            Previously seen        Stomach:                Appears normal, left
                                                                       sided
 Posterior Fossa:       Previously seen        Abdomen:                Previously seen
 Nuchal Fold:           Not applicable (>20    Abdominal Wall:         Previously seen
                        wks GA)
 Face:                  Orbits and profile     Cord Vessels:           Previously seen
                        previously seen
 Lips:                  Previously seen        Kidneys:                Appear normal
 Palate:                Previously seen        Bladder:                Appears normal
 Thoracic:              Appears normal         Spine:                  Limited views
                                                                       previously seen
 Heart:                 Echogenic focus        Upper Extremities:      Previously seen
                        in LV prev
 RVOT:                  Previously seen        Lower Extremities:      Previously seen

 Other:  Fetus appears to be a male. Heels visualized prev. Nasal bone
         visualized prev. Technically difficult due to maternal habitus and fetal
         position.
Cervix Uterus Adnexa

 Cervix
 Not visualized (advanced GA >74wks)
Impression

 Uterine-size-date discrepancy.  She does not have
 gestational diabetes.  Routine screening Negamiye
 antibodies were seen.  Patient was counseled that it is not
 associated with fetal  hemolysis.
 Amniotic fluid is normal and good fetal activity is seen. Fetal
 growth is appropriate for gestational age.

 We reassured the patient of the findings.

 Blood pressures today at our office were 141/64 and 130/75
 mmHg.  She does not have any symptoms.  She has blood
 pressure cuff at home and I discussed the parameters.
Recommendations

 Follow-up scans as clinically indicated.
                 [place], [place]

## 2020-05-17 ENCOUNTER — Emergency Department (HOSPITAL_COMMUNITY)
Admission: EM | Admit: 2020-05-17 | Discharge: 2020-05-18 | Disposition: A | Discharge status: Home / Self Care | Payer: Medicaid Other | Attending: Emergency Medicine | Admitting: Emergency Medicine

## 2020-05-17 ENCOUNTER — Encounter (HOSPITAL_COMMUNITY): Payer: Self-pay | Admitting: Emergency Medicine

## 2020-05-17 DIAGNOSIS — Z20822 Contact with and (suspected) exposure to covid-19: Secondary | ICD-10-CM | POA: Diagnosis not present

## 2020-05-17 DIAGNOSIS — Z046 Encounter for general psychiatric examination, requested by authority: Secondary | ICD-10-CM | POA: Diagnosis not present

## 2020-05-17 DIAGNOSIS — F32A Depression, unspecified: Secondary | ICD-10-CM | POA: Diagnosis present

## 2020-05-17 DIAGNOSIS — R45850 Homicidal ideations: Secondary | ICD-10-CM | POA: Insufficient documentation

## 2020-05-17 DIAGNOSIS — Z87891 Personal history of nicotine dependence: Secondary | ICD-10-CM | POA: Diagnosis not present

## 2020-05-17 DIAGNOSIS — F53 Postpartum depression: Secondary | ICD-10-CM | POA: Diagnosis not present

## 2020-05-17 LAB — RESP PANEL BY RT-PCR (FLU A&B, COVID) ARPGX2
Influenza A by PCR: NEGATIVE
Influenza B by PCR: NEGATIVE
SARS Coronavirus 2 by RT PCR: NEGATIVE
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
## 2020-05-18 DIAGNOSIS — F332 Major depressive disorder, recurrent severe without psychotic features: Secondary | ICD-10-CM | POA: Insufficient documentation
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
## 2020-08-03 ENCOUNTER — Ambulatory Visit: Payer: Medicaid Other | Admitting: Obstetrics and Gynecology

## 2020-08-20 ENCOUNTER — Emergency Department (HOSPITAL_COMMUNITY)
Admission: EM | Admit: 2020-08-20 | Discharge: 2020-08-21 | Discharge status: Against Medical Advice | Payer: Medicaid Other | Attending: Emergency Medicine | Admitting: Emergency Medicine

## 2020-08-20 ENCOUNTER — Other Ambulatory Visit: Payer: Self-pay

## 2020-08-20 ENCOUNTER — Encounter (HOSPITAL_COMMUNITY): Payer: Self-pay | Admitting: Emergency Medicine

## 2020-08-20 DIAGNOSIS — N898 Other specified noninflammatory disorders of vagina: Secondary | ICD-10-CM | POA: Diagnosis not present

## 2020-08-20 DIAGNOSIS — Z5321 Procedure and treatment not carried out due to patient leaving prior to being seen by health care provider: Secondary | ICD-10-CM | POA: Insufficient documentation

## 2020-08-20 DIAGNOSIS — R112 Nausea with vomiting, unspecified: Secondary | ICD-10-CM | POA: Diagnosis not present

## 2020-08-20 DIAGNOSIS — R197 Diarrhea, unspecified: Secondary | ICD-10-CM | POA: Insufficient documentation

## 2020-08-20 DIAGNOSIS — R1084 Generalized abdominal pain: Secondary | ICD-10-CM | POA: Diagnosis not present

## 2020-08-20 DIAGNOSIS — M25562 Pain in left knee: Secondary | ICD-10-CM | POA: Insufficient documentation

## 2020-08-20 NOTE — ED Provider Notes (Addendum)
Emergency Medicine Provider Triage Evaluation Note  LURLINE CAVER , a 25 y.o. female  was evaluated in triage.  Pt complains of left knee pain.  Feels like her knee has been very unstable when she walks.  Earlier today she felt like rotated to the left.  Has had pain since.  Has been able to ambulate.   Also with generalized abd pain and vaginal discharge. N/V/D x 3 days  Review of Systems  Positive: Left knee pain, lower abd, N/V/D Negative: Fall, paresthesias, weakness  Physical Exam  BP 129/82 (BP Location: Right Arm)   Pulse 93   Temp 97.8 F (36.6 C) (Oral)   Resp 18   SpO2 100%  Gen:   Awake, no distress   Resp:  Normal effort  MSK:   Moves extremities without difficulty, diffuse tenderness to superior lateral aspect of patella.  Tenderness to popliteal fossa.  Compartments soft.  Nontender bilateral calves Other:    Medical Decision Making  Medically screening exam initiated at 9:26 PM.  Appropriate orders placed.  [name] was informed that the remainder of the evaluation will be completed by another provider, this initial triage assessment does not replace that evaluation, and the importance of remaining in the ED until their evaluation is complete.  Left knee pain, abd pain, N/V/D      Andric Kerce A, PA-C 08/20/20 2129    Cathren Laine, MD 08/21/20 1708

## 2020-08-20 NOTE — ED Triage Notes (Signed)
Pt c/o abd pain, nausea and vomiting for the past 3  days. Report vaginal discharge.

## 2020-09-02 ENCOUNTER — Ambulatory Visit: Payer: Medicaid Other

## 2020-11-25 ENCOUNTER — Emergency Department (HOSPITAL_COMMUNITY)
Admission: EM | Admit: 2020-11-25 | Discharge: 2020-11-25 | Disposition: A | Discharge status: Home / Self Care | Payer: Medicaid Other | Attending: Emergency Medicine | Admitting: Emergency Medicine

## 2020-11-25 ENCOUNTER — Other Ambulatory Visit: Payer: Self-pay

## 2020-11-25 ENCOUNTER — Encounter (HOSPITAL_COMMUNITY): Payer: Self-pay | Admitting: Emergency Medicine

## 2020-11-25 ENCOUNTER — Emergency Department (HOSPITAL_COMMUNITY): Payer: Medicaid Other

## 2020-11-25 DIAGNOSIS — M25531 Pain in right wrist: Secondary | ICD-10-CM | POA: Diagnosis present

## 2020-11-25 DIAGNOSIS — Z87891 Personal history of nicotine dependence: Secondary | ICD-10-CM | POA: Insufficient documentation

## 2020-11-25 DIAGNOSIS — R600 Localized edema: Secondary | ICD-10-CM | POA: Insufficient documentation

## 2020-11-25 NOTE — ED Provider Notes (Signed)
[facility]-EMERGENCY DEPT Provider Note   CSN: [id number] Arrival date & time: 11/25/20  1229     History Chief Complaint  Patient presents with   Hand Pain    Bonnie Cobb is a 25 y.o. female.  Patient with no past medical history today presents with chief complaint of wrist pain.  Patient states that pain started following shift at [employer] last night where she had to fill several large orders.  Denies known injury.   The history is provided by the patient. No language interpreter was used.  Hand Pain Pertinent negatives include no chest pain, no abdominal pain, no headaches and no shortness of breath.      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 No MFM consult needed. Just needs Type and Crossmatch in labor (in case of PPH etc).   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   Attention deficit hyperactivity disorder 08/02/2019   Obesity 08/02/2019   History of syphilis 08/02/2019   Alpha thalassemia silent carrier 05/15/2019   History of depression 04/01/2019    Past Surgical History:  Procedure Laterality Date   NO PAST SURGERIES       OB History     Gravida  2   Para  1   Term  1   Preterm      AB  1   Living  1      SAB  1   IAB      Ectopic      Multiple  0   Live Births  1           Family History   Problem Relation Age of Onset   Healthy Mother    Healthy Father     Social History   Tobacco Use   Smoking status: Former    Types: Cigars   Smokeless tobacco: Never  Vaping Use   Vaping Use: Never used  Substance Use Topics   Alcohol use: No   Drug use: Not Currently    Types: Marijuana    Home Medications Prior to Admission medications   Medication Sig Start Date End Date Taking? Authorizing Provider  cetirizine (ZYRTEC ALLERGY) 10 MG tablet Take 1 tablet (10 mg total) by mouth daily. Patient not taking: No sig reported 03/31/20   Particia Nearing, PA-C  cyclobenzaprine (FLEXERIL) 10 MG tablet Take 1 tablet (10 mg total) by mouth 3 (three) times daily as needed for muscle spasms. 02/20/20   Roxy Horseman, PA-C  fluticasone (FLONASE) 50 MCG/ACT nasal spray Place 1 spray into both nostrils 2 (two) times daily. 03/31/20   Particia Nearing, PA-C  ibuprofen (ADVIL) 600 MG tablet Take 1 tablet (600 mg total) by mouth every 6 (six) hours as needed. Patient not taking: No sig reported 09/22/19   Darr, Gerilyn Pilgrim, PA-C  predniSONE (DELTASONE) 20 MG tablet Take 2 tablets (40 mg total) by  mouth daily with breakfast. Patient not taking: No sig reported 03/31/20   Particia Nearing, PA-C    Allergies    Pineapple  Review of Systems   Review of Systems  Constitutional:  Negative for chills and fever.  HENT:  Negative for congestion, postnasal drip, rhinorrhea and sore throat.   Respiratory:  Negative for cough and shortness of breath.   Cardiovascular:  Negative for chest pain, palpitations and leg swelling.  Gastrointestinal:  Negative for abdominal distention, abdominal pain, diarrhea, nausea and vomiting.  Musculoskeletal:        Right wrist pain and swelling  Neurological:  Negative for dizziness, tremors, seizures, syncope, facial asymmetry, speech difficulty, weakness, light-headedness, numbness and headaches.  Psychiatric/Behavioral:  Negative for confusion and  decreased concentration.   All other systems reviewed and are negative.  Physical Exam Updated Vital Signs BP (!) 159/108 (BP Location: Left Arm)   Pulse 75   Temp 98.3 F (36.8 C) (Oral)   Resp 18   LMP 11/22/2020 (Exact Date)   SpO2 100%   Physical Exam Vitals and nursing note reviewed.  Constitutional:      General: She is not in acute distress.    Appearance: Normal appearance. She is normal weight. She is not ill-appearing, toxic-appearing or diaphoretic.  HENT:     Head: Normocephalic and atraumatic.  Cardiovascular:     Rate and Rhythm: Normal rate.  Pulmonary:     Effort: Pulmonary effort is normal. No respiratory distress.  Musculoskeletal:     Cervical back: Normal range of motion.     Comments: Full range of motion noted to affected wrist.  Some mild swelling noted as well.  Tender to the lateral aspect of posterior right wrist.  No snuffbox tenderness.  Sensation and pulses intact.  Skin:    General: Skin is warm and dry.  Neurological:     General: No focal deficit present.     Mental Status: She is alert.  Psychiatric:        Mood and Affect: Mood normal.        Behavior: Behavior normal.    ED Results / Procedures / Treatments   Labs (all labs ordered are listed, but only abnormal results are displayed) Labs Reviewed - No data to display  EKG None  Radiology DG Hand Complete Right  Result Date: 11/25/2020 CLINICAL DATA:  Hand pain, swelling EXAM: RIGHT HAND - COMPLETE 3+ VIEW COMPARISON:  None. FINDINGS: There is no acute fracture or dislocation. Bony alignment is normal. The joint spaces are preserved. The soft tissues are unremarkable. There is no radiopaque foreign body. IMPRESSION: Unremarkable hand radiographs Electronically Signed   By: Lesia Hausen M.D.   On: 11/25/2020 13:57    Procedures Procedures   Medications Ordered in ED Medications - No data to display  ED Course  I have reviewed the triage vital signs and the nursing  notes.  Pertinent labs & imaging results that were available during my care of the patient were reviewed by me and considered in my medical decision making (see chart for details).    MDM Rules/Calculators/A&P                         Patient presents today for right wrist pain.  No acute injury.  No snuffbox tenderness.  She has full range of motion.  Patient X-Ray negative for obvious fracture or dislocation. Pain is managed at this time.  Pt advised to follow  up with orthopedics if symptoms persist for possibility of missed fracture diagnosis. Conservative therapy recommended and discussed. Patient will be dc home & is agreeable with above plan.    Final Clinical Impression(s) / ED Diagnoses Final diagnoses:  Right wrist pain    Rx / DC Orders ED Discharge Orders     None     An After Visit Summary was printed and given to the patient.    Vear Clock 11/25/20 2022    Lorre Nick, MD 11/26/20 1144

## 2020-11-25 NOTE — Discharge Instructions (Addendum)
Imaging of your wrist was negative for fracture.  Suspect that you strained a muscle from overuse at your job.  Rest and ice the affected joint as possible.  You can also get a brace for additional support if warranted.  Alternate ibuprofen and Tylenol as needed for pain management.  Follow-up with your primary care doctor if pain persists.

## 2020-11-25 NOTE — ED Triage Notes (Signed)
Patient was at work last night and injured her hand, might have hit it into the wall, filled several large orders. ROM intact with pain and discomfort. Patient reports swelling.

## 2021-01-24 ENCOUNTER — Other Ambulatory Visit: Payer: Self-pay

## 2021-01-24 ENCOUNTER — Ambulatory Visit: Payer: Medicaid Other | Admitting: Internal Medicine

## 2021-02-01 ENCOUNTER — Ambulatory Visit (HOSPITAL_COMMUNITY)
Admission: EM | Admit: 2021-02-01 | Discharge: 2021-02-01 | Disposition: A | Discharge status: Home / Self Care | Payer: Medicaid Other | Attending: Physician Assistant | Admitting: Physician Assistant

## 2021-02-01 ENCOUNTER — Other Ambulatory Visit: Payer: Self-pay

## 2021-02-01 ENCOUNTER — Encounter (HOSPITAL_COMMUNITY): Payer: Self-pay

## 2021-02-01 DIAGNOSIS — Z113 Encounter for screening for infections with a predominantly sexual mode of transmission: Secondary | ICD-10-CM | POA: Diagnosis not present

## 2021-02-01 DIAGNOSIS — N3001 Acute cystitis with hematuria: Secondary | ICD-10-CM | POA: Diagnosis not present

## 2021-02-01 DIAGNOSIS — Z207 Contact with and (suspected) exposure to pediculosis, acariasis and other infestations: Secondary | ICD-10-CM | POA: Diagnosis not present

## 2021-02-01 DIAGNOSIS — R8290 Unspecified abnormal findings in urine: Secondary | ICD-10-CM | POA: Insufficient documentation

## 2021-02-01 DIAGNOSIS — N898 Other specified noninflammatory disorders of vagina: Secondary | ICD-10-CM | POA: Diagnosis not present

## 2021-02-01 DIAGNOSIS — R21 Rash and other nonspecific skin eruption: Secondary | ICD-10-CM | POA: Diagnosis not present

## 2021-02-01 LAB — RPR: RPR Ser Ql: NONREACTIVE

## 2021-02-01 LAB — HEPATITIS C ANTIBODY: HCV Ab: NONREACTIVE

## 2021-02-01 LAB — HIV ANTIBODY (ROUTINE TESTING W REFLEX): HIV Screen 4th Generation wRfx: NONREACTIVE

## 2021-02-01 LAB — POCT URINALYSIS DIPSTICK, ED / UC
Bilirubin Urine: NEGATIVE
Glucose, UA: NEGATIVE mg/dL
Nitrite: POSITIVE — AB
Protein, ur: 100 mg/dL — AB
Specific Gravity, Urine: 1.025 (ref 1.005–1.030)
Urobilinogen, UA: 1 mg/dL (ref 0.0–1.0)
pH: 7 (ref 5.0–8.0)

## 2021-02-01 LAB — POC URINE PREG, ED: Preg Test, Ur: NEGATIVE

## 2021-02-01 MED ORDER — PERMETHRIN 5 % EX CREA: TOPICAL_CREAM | CUTANEOUS | 0 refills | Status: DC

## 2021-02-01 MED ORDER — NITROFURANTOIN MONOHYD MACRO 100 MG PO CAPS: 100.0000 mg | ORAL_CAPSULE | Freq: Two times a day (BID) | ORAL | 0 refills | Status: DC

## 2021-02-01 NOTE — ED Triage Notes (Signed)
Pt c/o vaginal discharge, odor, and incontinence of urine at times x1wk.   Pt states has bumps to lt arm and hands for months.

## 2021-02-01 NOTE — Discharge Instructions (Addendum)
We are treating you for a urinary tract infection.  Please take Macrobid twice daily.  Make sure you are drinking plenty of fluid.  We will contact you with your lab results if we need to change her treatment.  Apply permethrin to your entire body.  Leave this on for 8 to 14 hours then take a shower.  Make sure to wash everything in your house on high heat.  If you have persistent symptoms please return for reevaluation.

## 2021-02-01 NOTE — ED Provider Notes (Signed)
[facility]    CSN: [id number] Arrival date & time: 02/01/21  0809      History   Chief Complaint Chief Complaint  Patient presents with   Vaginal Discharge    HPI Bonnie Cobb is a 25 y.o. female.   Patient presents today with a 1 week history of vaginal odor.  She reports associated urinary frequency and urgency.  Denies dysuria, abdominal pain, pelvic pain, fever, nausea, vomiting.  She is sexually active and has no concern for STI but is open to testing today.  She denies any changes to personal hygiene products including soaps or detergents.  Denies any recent antibiotic use.  She denies history of nephrolithiasis, single kidney, self-catheterization.  In addition, patient reports a several month history of pruritic rash involving her inner thighs, wrists, hands.  Reports known scabies exposure.  She has not been treated for this in the past.  Reports intense pruritus but denies any pain.  She has not tried any over-the-counter medication for symptom management.  Denies history of dermatological condition.  Denies any changes to personal hygiene products or medications.   Past Medical History:  Diagnosis Date   ADHD (attention deficit hyperactivity disorder)    Anemia affecting pregnancy in second trimester 04/02/2019   Rx for Iron sent    Depression    Maternal atypical antibody complicating pregnancy [id number]   Anti A: Not clinically significant as routinely not implicated in fetal anemia/alloimmunization, but titer can be rechecked in third trimester. No MFM consult needed. Just needs Type and Crossmatch in labor (in case of PPH etc).   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 Authorizing Provider  nitrofurantoin, macrocrystal-monohydrate, (MACROBID) 100 MG capsule Take 1 capsule (100 mg total) by mouth 2 (two) times daily. 02/01/21  Yes Jahliyah Trice, Derry Skill, PA-C  permethrin (ELIMITE) 5 % cream Apply to affected area once 02/01/21  Yes Emmaclaire Switala K, PA-C  cetirizine (ZYRTEC ALLERGY) 10 MG tablet Take 1 tablet (10 mg total) by mouth daily. Patient not taking: No sig reported 03/31/20   Volney American, PA-C  cyclobenzaprine (FLEXERIL) 10 MG tablet Take 1 tablet (10 mg total) by mouth 3 (three) times daily as needed for muscle spasms. 02/20/20   Montine Circle, PA-C  fluticasone (FLONASE) 50 MCG/ACT nasal spray Place 1 spray into both nostrils 2 (two) times daily. 03/31/20   Volney American,  PA-C  ibuprofen (ADVIL) 600 MG tablet Take 1 tablet (600 mg total) by mouth every 6 (six) hours as needed. Patient not taking: No sig reported 09/22/19   Darr, Edison Nasuti, PA-C  predniSONE (DELTASONE) 20 MG tablet Take 2 tablets (40 mg total) by mouth daily with breakfast. Patient not taking: No sig reported 03/31/20   Volney American, PA-C  traZODone (DESYREL) 50 MG tablet Take 50 mg by mouth at bedtime. 01/22/21   [provider]    Family History Family History  Problem Relation Age of Onset   Healthy Mother    Healthy Father     Social History Social History   Tobacco Use   Smoking status: Former    Types:  Cigars   Smokeless tobacco: Never  Scientific laboratory technician Use: Never used  Substance Use Topics   Alcohol use: No   Drug use: Not Currently    Types: Marijuana     Allergies   Pineapple   Review of Systems Review of Systems  Constitutional:  Positive for activity change. Negative for appetite change, fatigue and fever.  Respiratory:  Negative for cough and shortness of breath.   Cardiovascular:  Negative for chest pain.  Gastrointestinal:  Negative for abdominal pain, diarrhea, nausea and vomiting.  Genitourinary:  Positive for frequency, urgency and vaginal discharge. Negative for dysuria, pelvic pain, vaginal bleeding and vaginal pain.  Skin:  Positive for rash.  Neurological:  Negative for dizziness, light-headedness and headaches.    Physical Exam Triage Vital Signs ED Triage Vitals  Enc Vitals Group     BP 02/01/21 0831 119/79     Pulse Rate 02/01/21 0831 91     Resp 02/01/21 0831 18     Temp 02/01/21 0831 98.5 F (36.9 C)     Temp Source 02/01/21 0831 Oral     SpO2 02/01/21 0831 99 %     Weight --      Height --      Head Circumference --      Peak Flow --      Pain Score 02/01/21 0832 0     Pain Loc --      Pain Edu? --      Excl. in [place]? --    No data found.  Updated Vital Signs BP 119/79 (BP Location: Left Arm)   Pulse 91   Temp 98.5 F (36.9 C) (Oral)   Resp 18   LMP 01/23/2021   SpO2 99%   Breastfeeding No   Visual Acuity Right Eye Distance:   Left Eye Distance:   Bilateral Distance:    Right Eye Near:   Left Eye Near:    Bilateral Near:     Physical Exam Vitals reviewed.  Constitutional:      General: She is awake. She is not in acute distress.    Appearance: Normal appearance. She is well-developed. She is not ill-appearing.     Comments: Very pleasant female appears stated age in no acute distress  HENT:     Head: Normocephalic and atraumatic.  Cardiovascular:     Rate and Rhythm: Normal rate and regular rhythm.     Heart sounds:  Normal heart sounds, S1 normal and S2 normal. No murmur heard. Pulmonary:     Effort: Pulmonary effort is normal.     Breath sounds: Normal breath sounds. No wheezing, rhonchi or rales.     Comments: Clear to auscultation bilaterally Abdominal:     Palpations: Abdomen is soft.  Tenderness: There is no abdominal tenderness.  Skin:    Findings: Rash present. Rash is macular and papular.     Comments: Maculopapular rash noted on bilateral wrists and forearms with evidence of excoriation.  No vesicles noted.  Macules noted in interdigital space without burrows.  Psychiatric:        Behavior: Behavior is cooperative.     UC Treatments / Results  Labs (all labs ordered are listed, but only abnormal results are displayed) Labs Reviewed  POCT URINALYSIS DIPSTICK, ED / UC - Abnormal; Notable for the following components:      Result Value   Ketones, ur TRACE (*)    Hgb urine dipstick SMALL (*)    Protein, ur 100 (*)    Nitrite POSITIVE (*)    Leukocytes,Ua SMALL (*)    All other components within normal limits  URINE CULTURE  HIV ANTIBODY (ROUTINE TESTING W REFLEX)  HEPATITIS C ANTIBODY  RPR  POC URINE PREG, ED  CERVICOVAGINAL ANCILLARY ONLY    EKG   Radiology No results found.  Procedures Procedures (including critical care time)  Medications Ordered in UC Medications - No data to display  Initial Impression / Assessment and Plan / UC Course  I have reviewed the triage vital signs and the nursing notes.  Pertinent labs & imaging results that were available during my care of the patient were reviewed by me and considered in my medical decision making (see chart for details).     UA showed evidence of urinary tract infection.  Urine pregnancy was negative.  STI swab and blood testing collected today-results pending.  Will treat for UTI and defer additional treatment until swab results are obtained.  She was started on Macrobid twice daily for 5 days.  Discussed  potential need to change antibiotics based on susceptibilities identified on culture.  She is to rest and drink plenty of fluid.  Discussed the importance of safe sex practices.  Discussed alarm symptoms that warrant emergent evaluation.  Strict return precautions given to which she expressed understanding.  She was treated for scabies with permethrin.  Discussed the importance of applying this as prescribed and washing all soft items in household.  Household contacts should be treated as well.  Discussed alarm symptoms that warrant emergent evaluation.  Final Clinical Impressions(s) / UC Diagnoses   Final diagnoses:  Acute cystitis with hematuria  Malodorous urine  Vaginal discharge  Routine screening for STI (sexually transmitted infection)  Rash and nonspecific skin eruption  Scabies exposure     Discharge Instructions      We are treating you for a urinary tract infection.  Please take Macrobid twice daily.  Make sure you are drinking plenty of fluid.  We will contact you with your lab results if we need to change her treatment.  Apply permethrin to your entire body.  Leave this on for 8 to 14 hours then take a shower.  Make sure to wash everything in your house on high heat.  If you have persistent symptoms please return for reevaluation.     ED Prescriptions     Medication Sig Dispense Auth. Provider   nitrofurantoin, macrocrystal-monohydrate, (MACROBID) 100 MG capsule Take 1 capsule (100 mg total) by mouth 2 (two) times daily. 10 capsule Lonya Johannesen K, PA-C   permethrin (ELIMITE) 5 % cream Apply to affected area once 60 g Siddhanth Denk K, PA-C      PDMP not reviewed this encounter.   Jeani Hawking, PA-C 02/01/21  0906  

## 2021-02-03 LAB — CERVICOVAGINAL ANCILLARY ONLY
Bacterial Vaginitis (gardnerella): POSITIVE — AB
Candida Glabrata: NEGATIVE
Candida Vaginitis: POSITIVE — AB
Chlamydia: POSITIVE — AB
Comment: NEGATIVE
Comment: NEGATIVE
Comment: NEGATIVE
Comment: NEGATIVE
Comment: NEGATIVE
Comment: NORMAL
Neisseria Gonorrhea: POSITIVE — AB
Trichomonas: POSITIVE — AB

## 2021-02-03 LAB — URINE CULTURE: Culture: >=100000 — AB

## 2021-02-04 ENCOUNTER — Telehealth (HOSPITAL_COMMUNITY): Payer: Self-pay | Admitting: Emergency Medicine

## 2021-02-04 ENCOUNTER — Encounter: Payer: Self-pay | Admitting: Internal Medicine

## 2021-02-04 MED ORDER — FLUCONAZOLE 150 MG PO TABS: 150.0000 mg | ORAL_TABLET | Freq: Once | ORAL | 0 refills | Status: AC

## 2021-02-04 MED ORDER — METRONIDAZOLE 500 MG PO TABS: 500.0000 mg | ORAL_TABLET | Freq: Two times a day (BID) | ORAL | 0 refills | Status: DC

## 2021-02-04 MED ORDER — DOXYCYCLINE HYCLATE 100 MG PO CAPS: 100.0000 mg | ORAL_CAPSULE | Freq: Two times a day (BID) | ORAL | 0 refills | Status: AC

## 2021-02-04 NOTE — Telephone Encounter (Signed)
Per protocol, patient will need treatment with IM Rocephin 500mg  for positive Gonorrhea, Doxycycline, Metronidazole, and Diflucan.  Attempted to reach patient x 1, LVM Prescriptions sent to pharmacy on file [facility] notified

## 2021-02-16 ENCOUNTER — Other Ambulatory Visit: Payer: Self-pay

## 2021-02-16 ENCOUNTER — Ambulatory Visit
Admission: EM | Admit: 2021-02-16 | Discharge: 2021-02-16 | Disposition: A | Discharge status: Home / Self Care | Payer: Medicaid Other | Attending: Internal Medicine | Admitting: Internal Medicine

## 2021-02-16 DIAGNOSIS — A549 Gonococcal infection, unspecified: Secondary | ICD-10-CM | POA: Diagnosis not present

## 2021-02-16 MED ORDER — CEFTRIAXONE SODIUM 250 MG IJ SOLR
500.0000 mg | Freq: Once | INTRAMUSCULAR | Status: AC
  Administered 2021-02-16: 500 mg via INTRAMUSCULAR

## 2021-02-16 NOTE — ED Triage Notes (Signed)
Patient presents for treatment of positive Gonorrhea

## 2021-02-27 ENCOUNTER — Ambulatory Visit: Payer: Medicaid Other | Admitting: Internal Medicine

## 2021-03-26 ENCOUNTER — Encounter (HOSPITAL_COMMUNITY): Payer: Self-pay

## 2021-03-26 ENCOUNTER — Other Ambulatory Visit: Payer: Self-pay

## 2021-03-26 ENCOUNTER — Ambulatory Visit (HOSPITAL_COMMUNITY)
Admission: EM | Admit: 2021-03-26 | Discharge: 2021-03-26 | Disposition: A | Discharge status: Home / Self Care | Payer: Medicaid Other | Attending: Family Medicine | Admitting: Family Medicine

## 2021-03-26 DIAGNOSIS — M62830 Muscle spasm of back: Secondary | ICD-10-CM | POA: Diagnosis not present

## 2021-03-26 LAB — POCT URINALYSIS DIPSTICK, ED / UC
Bilirubin Urine: NEGATIVE
Glucose, UA: NEGATIVE mg/dL
Ketones, ur: NEGATIVE mg/dL
Leukocytes,Ua: NEGATIVE
Nitrite: NEGATIVE
Protein, ur: NEGATIVE mg/dL
Specific Gravity, Urine: 1.02 (ref 1.005–1.030)
Urobilinogen, UA: 0.2 mg/dL (ref 0.0–1.0)
pH: 6.5 (ref 5.0–8.0)

## 2021-03-26 LAB — POC URINE PREG, ED: Preg Test, Ur: NEGATIVE

## 2021-03-26 MED ORDER — CYCLOBENZAPRINE HCL 10 MG PO TABS: 10.0000 mg | ORAL_TABLET | Freq: Three times a day (TID) | ORAL | 0 refills | Status: DC | PRN

## 2021-03-26 NOTE — ED Triage Notes (Signed)
Pt presents with c/o back spasms x1 week.   States she has not taken medicine at home.

## 2021-03-26 NOTE — ED Provider Notes (Signed)
[facility]    CSN: [id number] Arrival date & time: 03/26/21  1302      History   Chief Complaint Chief Complaint  Patient presents with   Back Pain    HPI Bonnie Cobb is a 26 y.o. female.    Back Pain With muscle spasms in her left low back for the last 2 weeks.  It is intermittent.  It can be triggered by walking or by leaning down.  No dysuria, but she has occasional urinary leaking.  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 No MFM consult needed. Just needs Type and Crossmatch in labor (in case of PPH etc).   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 Authorizing Provider   cyclobenzaprine (FLEXERIL) 10 MG tablet Take 1 tablet (10 mg total) by mouth 3 (three) times daily as needed for muscle spasms. 03/26/21   Zenia Resides, MD  fluticasone (FLONASE) 50 MCG/ACT nasal spray Place 1 spray into both nostrils 2 (two) times daily. 03/31/20   Particia Nearing, PA-C  traZODone (DESYREL) 50 MG tablet Take 50 mg by mouth at bedtime. 01/22/21   [provider]    Family History Family History  Problem Relation Age of Onset   Healthy Mother    Healthy Father     Social History Social History   Tobacco Use   Smoking status: Former    Types: Cigars   Smokeless tobacco: Never  Vaping Use   Vaping Use: Never used  Substance Use Topics   Alcohol use: No   Drug use: Not Currently    Types: Marijuana     Allergies   Pineapple   Review of Systems Review of Systems  Musculoskeletal:  Positive for back pain.    Physical Exam Triage Vital Signs ED Triage Vitals  Enc Vitals Group     BP 03/26/21 1317 115/71     Pulse Rate  03/26/21 1317 87     Resp 03/26/21 1317 17     Temp 03/26/21 1317 98.7 F (37.1 C)     Temp Source 03/26/21 1317 Oral     SpO2 03/26/21 1317 98 %     Weight --      Height --      Head Circumference --      Peak Flow --      Pain Score 03/26/21 1316 7     Pain Loc --      Pain Edu? --      Excl. in GC? --    No data found.  Updated Vital Signs BP 115/71 (BP Location: Right Arm)    Pulse 87    Temp 98.7 F (37.1 C) (Oral)    Resp 17    LMP 03/12/2021 (Exact Date)    SpO2 98%   Visual Acuity Right Eye Distance:   Left Eye Distance:   Bilateral Distance:    Right Eye Near:   Left Eye Near:    Bilateral Near:     Physical Exam Vitals reviewed.  Constitutional:      General: She is not in acute distress.    Appearance: She is not toxic-appearing.  HENT:     Mouth/Throat:     Mouth: Mucous membranes are moist.  Cardiovascular:     Rate and Rhythm: Normal rate and regular rhythm.     Heart sounds:  No murmur heard. Pulmonary:     Effort: Pulmonary effort is normal.     Breath sounds: Normal breath sounds.  Musculoskeletal:        General: No tenderness.     Cervical back: Neck supple.  Lymphadenopathy:     Cervical: No cervical adenopathy.  Skin:    Capillary Refill: Capillary refill takes less than 2 seconds.     Findings: No rash.  Neurological:     General: No focal deficit present.     Mental Status: She is alert.  Psychiatric:        Behavior: Behavior normal.     UC Treatments / Results  Labs (all labs ordered are listed, but only abnormal results are displayed) Labs Reviewed  POCT URINALYSIS DIPSTICK, ED / UC - Abnormal; Notable for the following components:      Result Value   Hgb urine dipstick TRACE (*)    All other components within normal limits  POC URINE PREG, ED    EKG   Radiology No results found.  Procedures Procedures (including critical care time)  Medications Ordered in UC Medications - No data to display  Initial Impression / Assessment and Plan / UC Course  I have reviewed the triage vital signs and the nursing notes.  Pertinent labs & imaging results that were available during my care of the patient were reviewed by me and considered in my medical decision making (see chart for details).     UA neg except for a trace of blood; UPT neg. Final Clinical Impressions(s) / UC Diagnoses   Final diagnoses:  Muscle spasm of back     Discharge Instructions      Pregnancy test was negative.  Your urinalysis was essentially clear.  Take cyclobenzaprine 10 mg 1 every 8 hours as needed for muscle spasms.  It still has the potential to make you sleepy.     ED Prescriptions     Medication Sig Dispense Auth. Provider   cyclobenzaprine (FLEXERIL) 10 MG tablet Take 1 tablet (10 mg  total) by mouth 3 (three) times daily as needed for muscle spasms. 20 tablet Kathie Posa, Janace ArisPamela K, MD      I have reviewed the PDMP during this encounter.    Zenia ResidesBanister, Sari Cogan K, MD 03/26/21 [id number]

## 2021-03-26 NOTE — Discharge Instructions (Addendum)
Pregnancy test was negative.  Your urinalysis was essentially clear.  Take cyclobenzaprine 10 mg 1 every 8 hours as needed for muscle spasms.  It still has the potential to make you sleepy.

## 2021-04-28 ENCOUNTER — Encounter (HOSPITAL_COMMUNITY): Payer: Self-pay

## 2021-04-28 ENCOUNTER — Emergency Department (HOSPITAL_COMMUNITY)
Admission: EM | Admit: 2021-04-28 | Discharge: 2021-04-28 | Disposition: A | Discharge status: Home / Self Care | Payer: Medicaid Other | Attending: Emergency Medicine | Admitting: Emergency Medicine

## 2021-04-28 ENCOUNTER — Other Ambulatory Visit: Payer: Self-pay

## 2021-04-28 DIAGNOSIS — R21 Rash and other nonspecific skin eruption: Secondary | ICD-10-CM | POA: Diagnosis not present

## 2021-04-28 DIAGNOSIS — M7989 Other specified soft tissue disorders: Secondary | ICD-10-CM | POA: Diagnosis not present

## 2021-04-28 MED ORDER — PREDNISONE 10 MG PO TABS: 20.0000 mg | ORAL_TABLET | Freq: Every day | ORAL | 0 refills | Status: DC

## 2021-04-28 MED ORDER — TRIAMCINOLONE ACETONIDE 0.1 % EX OINT
1.0000 | TOPICAL_OINTMENT | Freq: Two times a day (BID) | CUTANEOUS | 0 refills | Status: DC
Start: 2021-04-28 — End: 2021-04-28

## 2021-04-28 MED ORDER — TRIAMCINOLONE ACETONIDE 0.1 % EX OINT: 1.0000 "application " | TOPICAL_OINTMENT | Freq: Two times a day (BID) | CUTANEOUS | 0 refills | Status: DC

## 2021-04-28 NOTE — ED Provider Notes (Signed)
[facility]-EMERGENCY DEPT Provider Note   CSN: [id number] Arrival date & time: 04/28/21  1131     History  Chief Complaint  Patient presents with   Rash   finger sweling    Bonnie Cobb is a 26 y.o. female.   Rash  Patient presents with rash to the medial thighs bilaterally.  This has been going on for 6 months, it is pruritic but not painful.  Has not been spreading elsewhere, the lesion is raised.  Denies any drainage from the lesions, denies any vaginal involvement or rectal involvement.  She has tried Vaseline and cold moisturizer.  Neither these have helped alleviate the symptoms.  She does have sensitive skin but no history of dermatologic condition or allergies.  Home Medications Prior to Admission medications   Medication Sig Start Date End Date Taking? Authorizing Provider  cyclobenzaprine (FLEXERIL) 10 MG tablet Take 1 tablet (10 mg total) by mouth 3 (three) times daily as needed for muscle spasms. 03/26/21   Zenia Resides, MD  fluticasone (FLONASE) 50 MCG/ACT nasal spray Place 1 spray into both nostrils 2 (two) times daily. 03/31/20   Particia Nearing, PA-C  predniSONE (DELTASONE) 10 MG tablet Take 2 tablets (20 mg total) by mouth daily. 04/28/21   Theron Arista, PA-C  traZODone (DESYREL) 50 MG tablet Take 50 mg by mouth at bedtime. 01/22/21   [provider]  triamcinolone ointment (KENALOG) 0.1 % Apply 1 application topically 2 (two) times daily. 04/28/21   Theron Arista, PA-C      Allergies    Other and Pineapple    Review of Systems   Review of Systems  Skin:  Positive for rash.   Physical Exam Updated Vital Signs BP (!) 138/110 (BP Location: Left Arm)    Pulse 95    Temp 98 F (36.7 C) (Oral)    Resp 18    Ht 5' 3.5" (1.613 m)    Wt 125.4 kg    LMP 03/29/2021 (Approximate)    SpO2 100%    BMI 48.19 kg/m  Physical Exam Vitals and nursing note reviewed. Exam conducted with a chaperone present.  Constitutional:       Appearance: Normal appearance.  HENT:     Head: Normocephalic and atraumatic.  Eyes:     General: No scleral icterus.       Right eye: No discharge.        Left eye: No discharge.     Extraocular Movements: Extraocular movements intact.     Pupils: Pupils are equal, round, and reactive to light.  Cardiovascular:     Rate and Rhythm: Normal rate and regular rhythm.     Pulses: Normal pulses.     Heart sounds: Normal heart sounds. No murmur heard.   No friction rub. No gallop.  Pulmonary:     Effort: Pulmonary effort is normal. No respiratory distress.     Breath sounds: Normal breath sounds.  Abdominal:     General: Abdomen is flat. Bowel sounds are normal. There is no distension.     Palpations: Abdomen is soft.     Tenderness: There is no abdominal tenderness.  Skin:    General: Skin is warm and dry.     Coloration: Skin is not jaundiced.     Findings: Lesion present.     Comments: Raised lesions to medial thighs bilaterally.  Noted drainage, no surrounding erythema.  Not warm to touch.  Neurological:     Mental Status: She  is alert. Mental status is at baseline.     Coordination: Coordination normal.    ED Results / Procedures / Treatments   Labs (all labs ordered are listed, but only abnormal results are displayed) Labs Reviewed - No data to display  EKG None  Radiology No results found.  Procedures Procedures    Medications Ordered in ED Medications - No data to display  ED Course/ Medical Decision Making/ A&P                           Medical Decision Making Risk Prescription drug management.   26 year old with noncontributory medical history presenting due to rash.    Not appear consistent with necrotizing fasciitis, Stevens-Johnson or TE N.  She is not in anaphylaxis, but does have symptoms that could be consistent with allergic response but seems atypical given the duration of her symptoms.  We will proceed to treat with topical steroid and  discharged with oral steroids to take if there is no improvement in one week.  We did discuss the importance of following up with a dermatologist, she has insurance which should facilitate follow-up care.    Discussed HPI, physical exam and plan of care for this patient with attending Ankitt Nanavati. The attending physician evaluated this patient as part of a shared visit and agrees with plan of care.         Final Clinical Impression(s) / ED Diagnoses Final diagnoses:  Rash    Rx / DC Orders ED Discharge Orders          Ordered    triamcinolone ointment (KENALOG) 0.1 %  2 times daily,   Status:  Discontinued        04/28/21 1226    predniSONE (DELTASONE) 10 MG tablet  Daily,   Status:  Discontinued        04/28/21 1226    predniSONE (DELTASONE) 10 MG tablet  Daily        04/28/21 1232    triamcinolone ointment (KENALOG) 0.1 %  2 times daily        04/28/21 1232              Theron Arista, PA-C 04/28/21 2257    Derwood Kaplan, MD 04/30/21 1413

## 2021-04-28 NOTE — Discharge Instructions (Addendum)
See your primary care doctor next week to be reevaluated.  Use the topical steroid twice daily.  You should see results after a full week, if there is no improvement after 7 days start taking the oral steroids.  You will need to take 40 mg daily for 5 days.  Please call your insurance to see which dermatologist group is in network.  Also ask your primary if he can make a referral to help facilitate the process.  You can use calamine lotion for the itching.  You can also take over-the-counter Claritin or Benadryl.

## 2021-04-28 NOTE — ED Triage Notes (Signed)
Patient reports that she has had a rash to her inner thighs x 5 months. Patient showed a picture of the above and whelps present. Patient states she had scabies 7 months ago and used the cream that she had for scabies to the inner thighs. Patient c/o severe burning and itching.  Patient has swelling to her fingers on the right. Patient states that she began a new job and thinks she is allergic to the gloves that they provided for her to use.

## 2021-05-29 ENCOUNTER — Emergency Department (HOSPITAL_COMMUNITY)
Admission: EM | Admit: 2021-05-29 | Discharge: 2021-05-29 | Disposition: A | Discharge status: Home / Self Care | Payer: Medicaid Other | Attending: Emergency Medicine | Admitting: Emergency Medicine

## 2021-05-29 ENCOUNTER — Other Ambulatory Visit: Payer: Self-pay

## 2021-05-29 ENCOUNTER — Emergency Department (HOSPITAL_COMMUNITY): Payer: Medicaid Other

## 2021-05-29 ENCOUNTER — Encounter (HOSPITAL_COMMUNITY): Payer: Self-pay | Admitting: Emergency Medicine

## 2021-05-29 DIAGNOSIS — R1011 Right upper quadrant pain: Secondary | ICD-10-CM | POA: Diagnosis not present

## 2021-05-29 DIAGNOSIS — D649 Anemia, unspecified: Secondary | ICD-10-CM | POA: Insufficient documentation

## 2021-05-29 DIAGNOSIS — R112 Nausea with vomiting, unspecified: Secondary | ICD-10-CM | POA: Insufficient documentation

## 2021-05-29 DIAGNOSIS — R1013 Epigastric pain: Secondary | ICD-10-CM | POA: Diagnosis not present

## 2021-05-29 DIAGNOSIS — R7989 Other specified abnormal findings of blood chemistry: Secondary | ICD-10-CM | POA: Diagnosis not present

## 2021-05-29 LAB — COMPREHENSIVE METABOLIC PANEL
ALT: 17 U/L (ref 0–44)
AST: 28 U/L (ref 15–41)
Albumin: 4.1 g/dL (ref 3.5–5.0)
Alkaline Phosphatase: 53 U/L (ref 38–126)
Anion gap: 8 (ref 5–15)
BUN: 11 mg/dL (ref 6–20)
CO2: 26 mmol/L (ref 22–32)
Calcium: 9 mg/dL (ref 8.9–10.3)
Chloride: 102 mmol/L (ref 98–111)
Creatinine, Ser: 0.76 mg/dL (ref 0.44–1.00)
GFR, Estimated: >60 mL/min (ref >60)
Glucose, Bld: 78 mg/dL (ref 70–99)
Potassium: 4.4 mmol/L (ref 3.5–5.1)
Sodium: 136 mmol/L (ref 135–145)
Total Bilirubin: 1 mg/dL (ref 0.3–1.2)
Total Protein: 8.2 g/dL — ABNORMAL HIGH (ref 6.5–8.1)

## 2021-05-29 LAB — CBC WITH DIFFERENTIAL/PLATELET
Abs Immature Granulocytes: 0.02 10*3/uL (ref 0.00–0.07)
Basophils Absolute: 0.1 10*3/uL (ref 0.0–0.1)
Basophils Relative: 1 %
Eosinophils Absolute: 0.2 10*3/uL (ref 0.0–0.5)
Eosinophils Relative: 3 %
HCT: 35.3 % — ABNORMAL LOW (ref 36.0–46.0)
Hemoglobin: 10.8 g/dL — ABNORMAL LOW (ref 12.0–15.0)
Immature Granulocytes: 0 %
Lymphocytes Relative: 44 %
Lymphs Abs: 3.8 10*3/uL (ref 0.7–4.0)
MCH: 23.5 pg — ABNORMAL LOW (ref 26.0–34.0)
MCHC: 30.6 g/dL (ref 30.0–36.0)
MCV: 76.9 fL — ABNORMAL LOW (ref 80.0–100.0)
Monocytes Absolute: 0.4 10*3/uL (ref 0.1–1.0)
Monocytes Relative: 5 %
Neutro Abs: 4.1 10*3/uL (ref 1.7–7.7)
Neutrophils Relative %: 47 %
Platelets: 406 10*3/uL — ABNORMAL HIGH (ref 150–400)
RBC: 4.59 MIL/uL (ref 3.87–5.11)
RDW: 17.5 % — ABNORMAL HIGH (ref 11.5–15.5)
WBC: 8.7 10*3/uL (ref 4.0–10.5)
nRBC: 0 % (ref 0.0–0.2)

## 2021-05-29 LAB — URINALYSIS, ROUTINE W REFLEX MICROSCOPIC
Bilirubin Urine: NEGATIVE
Glucose, UA: NEGATIVE mg/dL
Hgb urine dipstick: NEGATIVE
Ketones, ur: NEGATIVE mg/dL
Leukocytes,Ua: NEGATIVE
Nitrite: NEGATIVE
Protein, ur: NEGATIVE mg/dL
Specific Gravity, Urine: >1.03 — ABNORMAL HIGH (ref 1.005–1.030)
pH: 6 (ref 5.0–8.0)

## 2021-05-29 LAB — POC URINE PREG, ED: Preg Test, Ur: NEGATIVE

## 2021-05-29 LAB — LIPASE, BLOOD: Lipase: 47 U/L (ref 11–51)

## 2021-05-29 MED ORDER — ONDANSETRON 4 MG PO TBDP: 4.0000 mg | ORAL_TABLET | Freq: Three times a day (TID) | ORAL | 0 refills | Status: DC | PRN

## 2021-05-29 MED ORDER — ONDANSETRON 4 MG PO TBDP
4.0000 mg | ORAL_TABLET | Freq: Once | ORAL | Status: AC
  Administered 2021-05-29: 4 mg via ORAL
  Filled 2021-05-29: qty 1

## 2021-05-29 MED ORDER — FAMOTIDINE 20 MG PO TABS: 20.0000 mg | ORAL_TABLET | Freq: Two times a day (BID) | ORAL | 0 refills | Status: DC | PRN

## 2021-05-29 MED ORDER — ALUM & MAG HYDROXIDE-SIMETH 200-200-20 MG/5ML PO SUSP
15.0000 mL | Freq: Once | ORAL | Status: DC
  Filled 2021-05-29: qty 30

## 2021-05-29 NOTE — ED Provider Notes (Signed)
Care assumed from [facility], please see her note for full details, but in brief Bonnie Cobb is a 26 y.o. female came in for evaluation of nausea and vomiting, just prior to arrival one of her coworkers had given her a dose of Dramamine without relief in her symptoms but upon arriving here she became very sleepy. ? ?Her work-up for abdominal pain, nausea and vomiting has been unremarkable and vomiting resolved with 1 dose of Zofran, right upper quadrant ultrasound without acute abnormalities. ? ?Patient still quite sleepy from Dramamine and will need to metabolize this medication and wake up a bit before she can safely go home. ? ?10:24 AM patient has been observed for several hours in the emergency department, she is now more awake and alert, tolerating p.o. and is safe for discharge home. ? ?  ?Dartha Lodge, PA-C ?05/29/21 1024 ? ?  ?Ernie Avena, MD ?05/29/21 1519 ? ?

## 2021-05-29 NOTE — ED Triage Notes (Signed)
Pt arrived via EMS from work. Pt states she has N/V which began 30 minutes ago. Pt states she has not had per period in 2 months. Pt took a pregnancy test a few days ago which was negative, but states that it is possible that she could be pregnant.  ?

## 2021-05-29 NOTE — Discharge Instructions (Addendum)
You were seen in the emergency department today for chest nausea/vomiting. Your work-up in the emergency department has been overall reassuring. Your labs have been fairly normal and or similar to previous blood work you have had done. Your ultrasound was normal.  ? ?We are sending you home with zofran to take every 8 hours as needed for nausea/vomiting and pepcid to take every 12 hours as needed for stomach pain.  ? ?We have prescribed you new medication(s) today. Discuss the medications prescribed today with your pharmacist as they can have adverse effects and interactions with your other medicines including over the counter and prescribed medications. Seek medical evaluation if you start to experience new or abnormal symptoms after taking one of these medicines, seek care immediately if you start to experience difficulty breathing, feeling of your throat closing, facial swelling, or rash as these could be indications of a more serious allergic reaction ? ? ?We would like you to follow up closely with your primary care provider  within 1-3 days. Return to the ER immediately should you experience any new or worsening symptoms including but not limited to return of pain, worsened pain, inability to keep fluids down, blood in vomit/stool, lightheadedness, passing out, or any other concerns that you may have.   ?

## 2021-05-29 NOTE — ED Provider Notes (Signed)
?[facility]-EMERGENCY DEPT ?Provider Note ? ? ?CSN: [id number] ?Arrival date & time: 05/29/21  0305 ? ?  ? ?History ? ?Chief Complaint  ?Patient presents with  ? Nausea  ? ? ?Bonnie Cobb is a 26 y.o. female with a history of PTSD, depression, and ADHD who presents to the emergency department via EMS with complaints of nausea and vomiting that started 30 minutes prior to arrival.  Patient reports she had 3 episodes of nonbloody emesis with some associated upper abdominal discomfort.  No alleviating or aggravating factors.  Was given Dramamine by her supervisor without relief in nausea.  Her last menstrual period was 2 months prior.  She denies any suspicious p.o. intake.  She denies fever, hematemesis, melena, hematochezia, diarrhea, dysuria, vaginal bleeding, or vaginal discharge. ? ?HPI ? ?  ? ?Home Medications ?Prior to Admission medications   ?Medication Sig Start Date End Date Taking? Authorizing Provider  ?cyclobenzaprine (FLEXERIL) 10 MG tablet Take 1 tablet (10 mg total) by mouth 3 (three) times daily as needed for muscle spasms. 03/26/21   Zenia ResidesBanister, Pamela K, MD  ?fluticasone (FLONASE) 50 MCG/ACT nasal spray Place 1 spray into both nostrils 2 (two) times daily. 03/31/20   Particia NearingLane, Rachel Elizabeth, PA-C  ?predniSONE (DELTASONE) 10 MG tablet Take 2 tablets (20 mg total) by mouth daily. 04/28/21   Theron AristaSage, Haley, PA-C  ?traZODone (DESYREL) 50 MG tablet Take 50 mg by mouth at bedtime. 01/22/21   [provider]  ?triamcinolone ointment (KENALOG) 0.1 % Apply 1 application topically 2 (two) times daily. 04/28/21   Theron AristaSage, Haley, PA-C  ?   ? ?Allergies    ?Other and Pineapple   ? ?Review of Systems   ?Review of Systems  ?Constitutional:  Negative for chills and fever.  ?Respiratory:  Negative for shortness of breath.   ?Cardiovascular:  Negative for chest pain.  ?Gastrointestinal:  Positive for abdominal pain, nausea and vomiting. Negative for blood in stool, constipation and diarrhea.   ?Genitourinary:  Negative for dysuria, vaginal bleeding and vaginal discharge.  ?Neurological:  Negative for syncope.  ?All other systems reviewed and are negative. ? ?Physical Exam ?Updated Vital Signs ?BP (!) 149/91   Pulse 97   Temp 98.7 ?F (37.1 ?C) (Oral)   Resp 16   SpO2 97%  ?Physical Exam ?Vitals and nursing note reviewed.  ?Constitutional:   ?   General: She is not in acute distress. ?   Appearance: She is well-developed. She is not toxic-appearing.  ?HENT:  ?   Head: Normocephalic and atraumatic.  ?Eyes:  ?   General:     ?   Right eye: No discharge.     ?   Left eye: No discharge.  ?   Conjunctiva/sclera: Conjunctivae normal.  ?Cardiovascular:  ?   Rate and Rhythm: Normal rate and regular rhythm.  ?Pulmonary:  ?   Effort: No respiratory distress.  ?   Breath sounds: Normal breath sounds. No wheezing or rales.  ?Abdominal:  ?   General: There is no distension.  ?   Palpations: Abdomen is soft.  ?   Tenderness: There is abdominal tenderness (epigastrium primarily, mild RUQ). There is no guarding or rebound. Negative signs include Murphy's sign and McBurney's sign.  ?Musculoskeletal:  ?   Cervical back: Neck supple.  ?Skin: ?   General: Skin is warm and dry.  ?Neurological:  ?   Mental Status: She is alert.  ?   Comments: Clear speech.   ?Psychiatric:     ?  Behavior: Behavior normal.  ? ? ?ED Results / Procedures / Treatments   ?Labs ?(all labs ordered are listed, but only abnormal results are displayed) ?Labs Reviewed  ?URINALYSIS, ROUTINE W REFLEX MICROSCOPIC - Abnormal; Notable for the following components:  ?    Result Value  ? Specific Gravity, Urine >1.030 (*)   ? All other components within normal limits  ?COMPREHENSIVE METABOLIC PANEL - Abnormal; Notable for the following components:  ? Total Protein 8.2 (*)   ? All other components within normal limits  ?LIPASE, BLOOD  ?CBC WITH DIFFERENTIAL/PLATELET  ?CBC WITH DIFFERENTIAL/PLATELET  ?POC URINE PREG, ED  ? ? ?EKG ?None ? ?Radiology ?US  Abdomen Limited RUQ (LIVER/GB) ? ?Result Date: 05/29/2021 ?CLINICAL DATA:  Right upper quadrant abdominal pain EXAM: ULTRASOUND ABDOMEN LIMITED RIGHT UPPER QUADRANT COMPARISON:  None. FINDINGS: Gallbladder: The gallbladder is decompressed. No gallstones or wall thickening visualized. No sonographic Murphy sign noted by sonographer. Common bile duct: Diameter: 3 mm in proximal diameter Liver: No focal lesion identified. Within normal limits in parenchymal echogenicity. Portal vein is patent on color Doppler imaging with normal direction of blood flow towards the liver. Other: None. IMPRESSION: Normal right upper quadrant sonogram Electronically Signed   By: Helyn Numbers M.D.   On: 05/29/2021 04:06   ? ?Procedures ?Procedures  ? ? ?Medications Ordered in ED ?Medications  ?ondansetron (ZOFRAN-ODT) disintegrating tablet 4 mg (4 mg Oral Given 05/29/21 0332)  ? ? ?ED Course/ Medical Decision Making/ A&P ?  ?                        ?Medical Decision Making ?Amount and/or Complexity of Data Reviewed ?Labs: ordered. ?Radiology: ordered. ? ?Risk ?OTC drugs. ?Prescription drug management. ? ? ?Patient presents to the ED with complaints of N/V, this involves an extensive number of treatment options, and is a complaint that carries with it a high risk of complications and morbidity. Nontoxic, vitals w/ HTN.  Epigastric tenderness and mild right upper quadrant tenderness without peritoneal signs on abdominal exam..  ? ?Ddx including but not limited to: Viral GI illness, GERD, PUD, gallbladder pathology, appendicitis, perforation, obstruction, IUP with nausea and vomiting, ectopic pregnancy ? ?Nursing staff with difficulty establishing IV access, ODT Zofran ordered currently. ? ?Additional history obtained:  ?Chart & nursing note reviewed.  ? ?Lab Tests:  ?I reviewed & interpreted labs including:  ?Pregnancy test: Negative ?CBC: mild anemia similar to prior ?CMP: mild elevation in protein otherwise unremarkable ?Lipase: WNL ?UA:  No UTI ? ?Imaging Studies ordered:  ?I ordered and viewed the following imaging, agree with radiologist impression:  ?Normal right upper quadrant sonogram  ? ?ED Course:  ?On reassessment patient has not had any further vomiting, she is drinking some water and ginger ale, however she is extremely drowsy and tired, she states that she thinks one of the medicine she received is making her feel this way.  Suspect this may be from the Dramamine she received earlier.  We will allow her to sleep in the emergency department and plan for reassessment. Repeat abdominal exam remains without peritoneal signs.  ? ?06:30: Patient care transitioned to [facility] @ shift change pending patient being more awake and safe to go home.  ? ?Portions of this note were generated with Scientist, clinical (histocompatibility and immunogenetics). Dictation errors may occur despite best attempts at proofreading. ? ? ?Final Clinical Impression(s) / ED Diagnoses ?Final diagnoses:  ?Nausea and vomiting, unspecified vomiting type  ? ? ?Rx /  DC Orders ?ED Discharge Orders   ? ?      Ordered  ?  ondansetron (ZOFRAN-ODT) 4 MG disintegrating tablet  Every 8 hours PRN       ? 05/29/21 0556  ?  famotidine (PEPCID) 20 MG tablet  2 times daily PRN       ? 05/29/21 0556  ? ?  ?  ? ?  ? ? ?  ?Cherly Anderson, PA-C ?05/29/21 0488 ? ?  ?Sabas Sous, MD ?05/29/21 [phone] ? ?

## 2021-05-29 NOTE — ED Notes (Signed)
Pt given water and ginger ale for PO challenge. Pt reports no vomiting after being given zofran. ?

## 2021-06-16 ENCOUNTER — Ambulatory Visit (HOSPITAL_COMMUNITY)
Admission: EM | Admit: 2021-06-16 | Discharge: 2021-06-16 | Disposition: A | Discharge status: Home / Self Care | Payer: Medicaid Other | Attending: Physician Assistant | Admitting: Physician Assistant

## 2021-06-16 ENCOUNTER — Encounter (HOSPITAL_COMMUNITY): Payer: Self-pay | Admitting: Emergency Medicine

## 2021-06-16 DIAGNOSIS — J4521 Mild intermittent asthma with (acute) exacerbation: Secondary | ICD-10-CM | POA: Diagnosis not present

## 2021-06-16 MED ORDER — METHYLPREDNISOLONE SODIUM SUCC 125 MG IJ SOLR: 125.0000 mg | Freq: Once | INTRAMUSCULAR | Status: DC

## 2021-06-16 MED ORDER — METHYLPREDNISOLONE SODIUM SUCC 125 MG IJ SOLR
INTRAMUSCULAR | Status: AC
  Filled 2021-06-16: qty 2

## 2021-06-16 MED ORDER — ALBUTEROL SULFATE HFA 108 (90 BASE) MCG/ACT IN AERS: 1.0000 | INHALATION_SPRAY | Freq: Four times a day (QID) | RESPIRATORY_TRACT | 1 refills | Status: DC | PRN

## 2021-06-16 MED ORDER — ALBUTEROL SULFATE (2.5 MG/3ML) 0.083% IN NEBU
INHALATION_SOLUTION | RESPIRATORY_TRACT | Status: AC
  Filled 2021-06-16: qty 3

## 2021-06-16 MED ORDER — ALBUTEROL SULFATE (2.5 MG/3ML) 0.083% IN NEBU
2.5000 mg | INHALATION_SOLUTION | Freq: Once | RESPIRATORY_TRACT | Status: AC
  Administered 2021-06-16: 2.5 mg via RESPIRATORY_TRACT

## 2021-06-16 MED ORDER — PREDNISONE 10 MG (21) PO TBPK: ORAL_TABLET | ORAL | 0 refills | Status: DC

## 2021-06-16 NOTE — Discharge Instructions (Addendum)
I am glad that you are feeling better after your breathing treatment.  I have sent in an inhaler.  Use this every 4-6 hours as needed for shortness of breath.  Start prednisone taper.  Do not take NSAIDs including aspirin, ibuprofen/Advil, naproxen/Aleve with this medication as it can cause stomach bleeding.  You can use Tylenol/acetaminophen as needed.  If your symptoms are not significantly better within a few days please return for reevaluation.  If at any point anything worsens and you develop shortness of breath, wheezing not responding to albuterol, severe cough, fever, lightheadedness you need to go to the emergency room as we discussed. ?

## 2021-06-16 NOTE — ED Triage Notes (Signed)
Pt reports shortness of breath and cough x 3 days. Pt states she is unable to catch her breath. Hx of anxiety, PTSD and depression.  ?

## 2021-06-16 NOTE — ED Provider Notes (Signed)
?[facility] ? ? ? ?CSN: [id number] ?Arrival date & time: 06/16/21  1152 ? ? ?  ? ?History   ?Chief Complaint ?Chief Complaint  ?Patient presents with  ? Shortness of Breath  ? Cough  ? Sore Throat  ? ? ?HPI ?Bonnie Cobb is a 26 y.o. female.  ? ?Patient presents today with a 3-day history of cough that has significantly worsened in the past 24 hours.  She reports wheezing and shortness of breath.  She has not been taking any over-the-counter medication for symptom management.  She feels as though she cannot catch her breath.  She denies history of chronic lung condition including asthma or COPD.  She occasionally smokes Black and milds but does not smoke regularly.  She does not believe that she is pregnant.  She denies any recent antibiotic or steroid use.  She denies any chest pain, lightheadedness, syncope. ? ? ?Past Medical History:  ?Diagnosis Date  ? ADHD (attention deficit hyperactivity disorder)   ? Anemia affecting pregnancy in second trimester 04/02/2019  ? Rx for Iron sent   ? Depression   ? 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 Anti A: Not clinically significant as routinely not implicated in fetal anemia/alloimmunization, but titer can be rechecked in third trimester. No MFM consult needed. Just needs Type and Crossmatch in labor (in case of PPH etc).  ? Maternal gonorrhea in second trimester 04/02/2019  ? NSVD (normal spontaneous vaginal delivery)   ? PTSD (post-traumatic stress disorder)   ? ? ?Patient Active Problem List  ? Diagnosis Date Noted  ? Major depressive disorder, recurrent severe without psychotic features (HCC)   ? 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 Attention deficit hyperactivity disorder 08/02/2019  ? Obesity 08/02/2019  ? History of syphilis 08/02/2019  ? Alpha thalassemia silent carrier 05/15/2019  ? History of depression 04/01/2019  ? ? ?Past Surgical History:  ?Procedure Laterality Date  ? NO PAST SURGERIES    ? ? ?OB History   ? ? Gravida  ?2  ?  Para  ?1  ? Term  ?1  ? Preterm  ?   ? AB  ?1  ? Living  ?1  ?  ? ? SAB  ?1  ? IAB  ?   ? Ectopic  ?   ? Multiple  ?0  ? Live Births  ?1  ?   ?  ?  ? ? ? ?Home Medications   ? ?Prior to Admission medications   ?Medication Sig Start Date End Date Taking? Authorizing Provider  ?albuterol (VENTOLIN HFA) 108 (90 Base) MCG/ACT inhaler Inhale 1-2 puffs into the lungs every 6 (six) hours as needed for wheezing or shortness of breath. 06/16/21  Yes Yonna Alwin K, PA-C  ?predniSONE (STERAPRED UNI-PAK 21 TAB) 10 MG (21) TBPK tablet As directed 06/16/21  Yes Ariyon Mittleman K, PA-C  ?cyclobenzaprine (FLEXERIL) 10 MG tablet Take 1 tablet (10 mg total) by mouth 3 (three) times daily as needed for muscle spasms. 03/26/21   Zenia ResidesBanister, Pamela K, MD  ?famotidine (PEPCID) 20 MG tablet Take 1 tablet (20 mg total) by mouth 2 (two) times daily as needed for heartburn or indigestion. 05/29/21   Petrucelli, Samantha R, PA-C  ?fluticasone (FLONASE) 50 MCG/ACT nasal spray Place 1 spray into both nostrils 2 (two) times daily. 03/31/20   Particia NearingLane, Rachel Elizabeth, PA-C  ?ondansetron (ZOFRAN-ODT) 4 MG disintegrating tablet Take 1 tablet (4 mg total) by mouth every 8 (eight) hours as needed for nausea  or vomiting. 05/29/21   Petrucelli, Lelon Mast R, PA-C  ?traZODone (DESYREL) 50 MG tablet Take 50 mg by mouth at bedtime. 01/22/21   [provider]  ?triamcinolone ointment (KENALOG) 0.1 % Apply 1 application topically 2 (two) times daily. 04/28/21   Theron Arista, PA-C  ? ? ?Family History ?Family History  ?Problem Relation Age of Onset  ? Healthy Mother   ? Healthy Father   ? ? ?Social History ?Social History  ? ?Tobacco Use  ? Smoking status: Some Days  ?  Types: Cigars  ? Smokeless tobacco: Never  ?Vaping Use  ? Vaping Use: Never used  ?Substance Use Topics  ? Alcohol use: No  ? Drug use: Not Currently  ?  Types: Marijuana  ? ? ? ?Allergies   ?Other and Pineapple ? ? ?Review of Systems ?Review of Systems  ?Constitutional:  Positive for activity change.  Negative for appetite change, fatigue and fever.  ?HENT:  Positive for congestion and sore throat. Negative for sinus pressure and sneezing.   ?Respiratory:  Positive for cough, chest tightness, shortness of breath and wheezing.   ?Cardiovascular:  Negative for chest pain.  ?Gastrointestinal:  Negative for abdominal pain, diarrhea, nausea and vomiting.  ?Neurological:  Negative for dizziness, light-headedness and headaches.  ? ? ?Physical Exam ?Triage Vital Signs ?ED Triage Vitals  ?Enc Vitals Group  ?   BP 06/16/21 1205 (!) 140/96  ?   Pulse Rate 06/16/21 1205 (!) 111  ?   Resp 06/16/21 1205 16  ?   Temp --   ?   Temp src --   ?   SpO2 06/16/21 1205 97 %  ?   Weight 06/16/21 1207 276 lb 7.3 oz (125.4 kg)  ?   Height 06/16/21 1207 5' 3.5" (1.613 m)  ?   Head Circumference --   ?   Peak Flow --   ?   Pain Score 06/16/21 1206 6  ?   Pain Loc --   ?   Pain Edu? --   ?   Excl. in GC? --   ? ?No data found. ? ?Updated Vital Signs ?BP (!) 140/96 (BP Location: Left Arm)   Pulse (!) 111   Resp 16   Ht 5' 3.5" (1.613 m)   Wt 276 lb 7.3 oz (125.4 kg)   SpO2 97%   BMI 48.20 kg/m?  ? ?Visual Acuity ?Right Eye Distance:   ?Left Eye Distance:   ?Bilateral Distance:   ? ?Right Eye Near:   ?Left Eye Near:    ?Bilateral Near:    ? ?Physical Exam ?Vitals reviewed.  ?Constitutional:   ?   General: She is awake. She is not in acute distress. ?   Appearance: Normal appearance. She is well-developed. She is ill-appearing.  ?   Comments: Very pleasant female appears stated age in no acute distress sitting in exam room  ?HENT:  ?   Head: Normocephalic and atraumatic.  ?Cardiovascular:  ?   Rate and Rhythm: Regular rhythm. Tachycardia present.  ?   Heart sounds: Normal heart sounds, S1 normal and S2 normal. No murmur heard. ?Pulmonary:  ?   Effort: Pulmonary effort is normal. Tachypnea present. No accessory muscle usage or respiratory distress.  ?   Breath sounds: Wheezing present. No rhonchi or rales.  ?Musculoskeletal:  ?   Right  lower leg: No edema.  ?   Left lower leg: No edema.  ?Psychiatric:     ?   Behavior: Behavior is cooperative.  ? ? ? ?  UC Treatments / Results  ?Labs ?(all labs ordered are listed, but only abnormal results are displayed) ?Labs Reviewed - No data to display ? ?EKG ? ? ?Radiology ?No results found. ? ?Procedures ?Procedures (including critical care time) ? ?Medications Ordered in UC ?Medications  ?albuterol (PROVENTIL) (2.5 MG/3ML) 0.083% nebulizer solution 2.5 mg (2.5 mg Nebulization Given 06/16/21 1232)  ? ? ?Initial Impression / Assessment and Plan / UC Course  ?I have reviewed the triage vital signs and the nursing notes. ? ?Pertinent labs & imaging results that were available during my care of the patient were reviewed by me and considered in my medical decision making (see chart for details). ? ?  ? ?Patient was given albuterol treatment in clinic today with significant improvement of shortness of breath and wheezing symptoms.  Originally prescribed Solu-Medrol 125 but she declined this as she was on interested in having an injection of medication.  After improvement of symptoms she was sent home with albuterol inhaler with instruction how to properly use this medication during clinic visit today.  She was started on prednisone taper with instruction not to take NSAIDs with this medication.  She can use Tylenol, Mucinex, Flonase for symptom relief.  She is to rest and drink plenty of fluid.  Discussed that if her symptoms have not significantly improved within 24 to 48 hours she should return for reevaluation.  If at any point anything worsens and she has severe symptoms including severe cough, fever, nausea/vomiting, shortness of breath, wheezing not responding to albuterol she needs to go to the emergency room to which she expressed understanding.  Strict return precautions given.  Work excuse note provided. ? ?Final Clinical Impressions(s) / UC Diagnoses  ? ?Final diagnoses:  ?Mild intermittent asthma with  acute exacerbation  ? ? ? ?Discharge Instructions   ? ?  ?I am glad that you are feeling better after your breathing treatment.  I have sent in an inhaler.  Use this every 4-6 hours as needed for shortness of br

## 2021-09-22 ENCOUNTER — Encounter (HOSPITAL_COMMUNITY): Payer: Self-pay

## 2021-09-22 ENCOUNTER — Ambulatory Visit (HOSPITAL_COMMUNITY)
Admission: RE | Admit: 2021-09-22 | Discharge: 2021-09-22 | Disposition: A | Discharge status: Home / Self Care | Payer: Medicaid Other | Source: Ambulatory Visit | Attending: Family Medicine | Admitting: Family Medicine

## 2021-09-22 VITALS — BP 111/78 | HR 73 | Temp 98.3°F | Resp 19

## 2021-09-22 DIAGNOSIS — N898 Other specified noninflammatory disorders of vagina: Secondary | ICD-10-CM | POA: Diagnosis not present

## 2021-09-22 DIAGNOSIS — Z113 Encounter for screening for infections with a predominantly sexual mode of transmission: Secondary | ICD-10-CM | POA: Diagnosis present

## 2021-09-22 LAB — POC URINE PREG, ED: Preg Test, Ur: NEGATIVE

## 2021-09-22 NOTE — ED Provider Notes (Signed)
[facility]    CSN: [id number] Arrival date & time: 09/22/21  1049      History   Chief Complaint Chief Complaint  Patient presents with   appt 1130    HPI Bonnie Cobb is a 26 y.o. female.   HPI Patient presents today for routine STD testing.  She reports recently engaging in sexual intercourse with a new partner and found out that her previous partner had multiple partners.  She reports 2-day history of copious vaginal discharge which is not irritating or itchy but has a mild odor.  She has no other symptoms. Patient's last menstrual period was 09/16/2021.   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 No MFM consult needed. Just needs Type and Crossmatch in labor (in case of PPH etc).   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 Authorizing Provider  cloNIDine (CATAPRES) 0.3 MG tablet Take 0.3 mg by mouth 2 (two) times daily.   Yes [provider]  albuterol (VENTOLIN HFA) 108 (90 Base) MCG/ACT inhaler Inhale 1-2 puffs into the lungs every 6 (six) hours as needed for wheezing or shortness of breath. 06/16/21   Raspet, Noberto Retort, PA-C  atomoxetine (STRATTERA) 25 MG capsule Take 25 mg by mouth daily at 2 PM.    [provider]  cyclobenzaprine (FLEXERIL) 10 MG tablet Take 1 tablet (10 mg total) by mouth 3 (three) times daily as needed for muscle spasms. 03/26/21   Zenia Resides, MD  famotidine (PEPCID) 20 MG tablet Take 1 tablet (20 mg total) by mouth 2 (two) times daily as needed for heartburn or indigestion. 05/29/21   Petrucelli, Samantha R, PA-C  fluticasone (FLONASE) 50 MCG/ACT nasal spray Place 1 spray into both nostrils 2 (two) times daily. 03/31/20   Particia Nearing, PA-C  ondansetron (ZOFRAN-ODT) 4 MG disintegrating tablet Take 1 tablet (4 mg total) by mouth every 8 (eight) hours as needed for nausea or vomiting. 05/29/21   Petrucelli, Samantha R, PA-C  predniSONE (STERAPRED UNI-PAK 21 TAB) 10 MG (21) TBPK tablet As directed  06/16/21   Raspet, Noberto Retort, PA-C  traZODone (DESYREL) 50 MG tablet Take 50 mg by mouth at bedtime. 01/22/21   [provider]  triamcinolone ointment (KENALOG) 0.1 % Apply 1 application topically 2 (two) times daily. 04/28/21   Theron Arista, PA-C    Family History Family History  Problem Relation Age of Onset   Healthy Mother    Healthy Father     Social History Social History   Tobacco Use   Smoking status: Some Days    Types: Cigars   Smokeless tobacco: Never  Vaping Use   Vaping Use: Never used  Substance Use Topics   Alcohol use: No   Drug use: Not Currently    Types: Marijuana     Allergies   Other and Pineapple   Review of Systems Review of Systems Pertinent negatives listed in HPI   Physical Exam Triage Vital  Signs ED Triage Vitals  Enc Vitals Group     BP 09/22/21 1143 111/78     Pulse Rate 09/22/21 1143 73     Resp 09/22/21 1143 19     Temp 09/22/21 1143 98.3 F (36.8 C)     Temp Source 09/22/21 1143 Oral     SpO2 09/22/21 1143 98 %     Weight --      Height --      Head Circumference --      Peak Flow --      Pain Score 09/22/21 1139 0     Pain Loc --      Pain Edu? --      Excl. in GC? --    No data found.  Updated Vital Signs BP 111/78 (BP Location: Left Arm)   Pulse 73   Temp 98.3 F (36.8 C) (Oral)   Resp 19   LMP 09/16/2021   SpO2 98%   Visual Acuity Right Eye Distance:   Left Eye Distance:   Bilateral Distance:    Right Eye Near:   Left Eye Near:    Bilateral Near:     Physical Exam Constitutional:      Appearance: Normal appearance.  HENT:     Head: Normocephalic and atraumatic.  Cardiovascular:     Rate and Rhythm: Normal rate and regular rhythm.  Musculoskeletal:     Cervical back: Normal range of motion.  Skin:    General: Skin is warm.     Capillary Refill: Capillary refill takes less than 2 seconds.  Neurological:     General: No focal deficit present.     Mental Status: She is alert and oriented to person, place, and time.  Psychiatric:        Mood and Affect: Mood normal.      Cytology self collected. UC Treatments / Results  Labs (all labs ordered are listed, but only abnormal results are displayed) Labs Reviewed  POC URINE PREG, ED  CERVICOVAGINAL ANCILLARY ONLY    EKG   Radiology No results found.  Procedures Procedures (including critical care time)  Medications Ordered in UC Medications - No data to display  Initial Impression / Assessment and Plan / UC Course  I have reviewed the triage vital signs and the nursing notes.  Pertinent labs & imaging results that were available during my care of the patient were reviewed by me and considered in my medical decision making (see chart for details).     Routine STI  screening Negative hCG pregnancy. Patient initially wanted blood work however could  not tolerate lab stick and requested that HIV and RPR ordered to be discontinued. Advised to avoid sexual intercourse until results are known. Return as needed. Final Clinical Impressions(s) / UC Diagnoses   Final diagnoses:  Routine screening for STI (sexually transmitted infection)  Vaginal discharge     Discharge Instructions      Monitor your MyChart for your results of your testing completed today.  Your results will be available within 24 to 48 hours.  Recommend avoiding sexual intercourse until results are known. Pregnancy is negative.      ED Prescriptions   None    PDMP not reviewed this encounter.   Bing Neighbors, FNP 09/22/21 1248

## 2021-09-22 NOTE — Discharge Instructions (Addendum)
Monitor your MyChart for your results of your testing completed today.  Your results will be available within 24 to 48 hours.  Recommend avoiding sexual intercourse until results are known. Pregnancy is negative.

## 2021-09-22 NOTE — ED Triage Notes (Signed)
Pt reports having vaginal discharge and some odor for a couple days. Wanting STD check due to partner having penile discharge as well.

## 2021-09-25 LAB — CERVICOVAGINAL ANCILLARY ONLY
Bacterial Vaginitis (gardnerella): POSITIVE — AB
Candida Glabrata: NEGATIVE
Candida Vaginitis: NEGATIVE
Chlamydia: NEGATIVE
Comment: NEGATIVE
Comment: NEGATIVE
Comment: NEGATIVE
Comment: NEGATIVE
Comment: NEGATIVE
Comment: NORMAL
Neisseria Gonorrhea: POSITIVE — AB
Trichomonas: POSITIVE — AB

## 2021-09-26 ENCOUNTER — Ambulatory Visit (HOSPITAL_COMMUNITY)
Admission: EM | Admit: 2021-09-26 | Discharge: 2021-09-26 | Disposition: A | Discharge status: Home / Self Care | Payer: Medicaid Other | Attending: Internal Medicine | Admitting: Internal Medicine

## 2021-09-26 ENCOUNTER — Telehealth (HOSPITAL_COMMUNITY): Payer: Self-pay | Admitting: Emergency Medicine

## 2021-09-26 MED ORDER — CEFTRIAXONE SODIUM 500 MG IJ SOLR
500.0000 mg | Freq: Once | INTRAMUSCULAR | Status: AC
Start: 2021-09-26 — End: 2021-09-26
  Administered 2021-09-26: 500 mg via INTRAMUSCULAR

## 2021-09-26 MED ORDER — CEFTRIAXONE SODIUM 500 MG IJ SOLR
INTRAMUSCULAR | Status: AC
  Filled 2021-09-26: qty 500

## 2021-09-26 MED ORDER — METRONIDAZOLE 500 MG PO TABS: 500.0000 mg | ORAL_TABLET | Freq: Two times a day (BID) | ORAL | 0 refills | Status: DC

## 2021-09-26 NOTE — Telephone Encounter (Signed)
Per protocol, patient will need treatment with IM Rocephin 500mg for positive Gonorrhea. Will also need treatment with Metronidazole.  Contacted patient by phone.  Verified identity using two identifiers.  Provided positive result.  Reviewed safe sex practices, notifying partners, and refraining from sexual activities for 7 days from time of treatment.  Patient verified understanding, all questions answered.   HHS notified Verified pharmacy, prescription sent 

## 2021-09-26 NOTE — ED Triage Notes (Signed)
Pt presents for gonorrhea treatment. 500 mg IM rocephin ordered per protocol.

## 2021-10-24 ENCOUNTER — Inpatient Hospital Stay (HOSPITAL_COMMUNITY)
Admission: AD | Admit: 2021-10-24 | Discharge: 2021-10-24 | Disposition: A | Discharge status: Home / Self Care | Payer: Medicaid Other | Attending: Obstetrics & Gynecology | Admitting: Obstetrics & Gynecology

## 2021-10-24 DIAGNOSIS — Z3202 Encounter for pregnancy test, result negative: Secondary | ICD-10-CM

## 2021-10-24 MED ORDER — PREPLUS 27-1 MG PO TABS: 1.0000 | ORAL_TABLET | Freq: Every day | ORAL | 13 refills | Status: DC

## 2021-10-24 NOTE — MAU Note (Addendum)
.  Bonnie Cobb is a 26 y.o. at Unknown here in MAU reporting: her for pregnancy test. She received a negative UPT at home and another negative at the pregnancy network. Denies any other complaints. No pain, VB, or abnormal discharge. LMP: 09/26/2021  Pain score: 0/10 Vitals:   10/24/21 1625  BP: (!) 110/57  Pulse: 91  Resp: 18  Temp: 98.2 F (36.8 C)  SpO2: 99%

## 2021-10-24 NOTE — MAU Provider Note (Signed)
None     S [name] is a 26 y.o. G2P1011 patient who presents to MAU today with complaint of desiring pregnancy test. Patient reports her SO and her mother are having pregnancy cravings that mimic the ones she had in previous pregnancies.  She endorses that she has had negative UPT at home and at the pregnancy network.  Patient reports a desire for pregnancy.   O BP (!) 110/57 (BP Location: Right Arm)   Pulse 91   Temp 98.2 F (36.8 C) (Oral)   Resp 18   Wt 124.4 kg   SpO2 99%   BMI 47.83 kg/m  Physical Exam Constitutional:      Appearance: Normal appearance. She is obese.  HENT:     Head: Normocephalic and atraumatic.  Eyes:     Conjunctiva/sclera: Conjunctivae normal.  Cardiovascular:     Rate and Rhythm: Normal rate.  Pulmonary:     Effort: Pulmonary effort is normal. No respiratory distress.  Abdominal:     General: Bowel sounds are normal.  Musculoskeletal:        General: Normal range of motion.     Cervical back: Normal range of motion.  Skin:    General: Skin is warm and dry.  Neurological:     Mental Status: She is alert and oriented to person, place, and time.  Psychiatric:        Mood and Affect: Mood normal.        Behavior: Behavior normal.     A Medical screening exam complete Negative UPT  P Informed that pregnancy test is negative today. Informed that provider can not speak to why patient family is having "pregnancy symptoms," but patient is not pregnant. Informed that in the absence of pain or other complaints no need to refer or transfer to [facility]. Patient desiring pregnancy and agreeable to taking PNV.  Rx sent to pharmacy on file. Discharge from MAU in stable condition   Gerrit Heck, [place] 10/24/2021 4:36 PM

## 2022-04-05 ENCOUNTER — Ambulatory Visit (HOSPITAL_COMMUNITY)
Admission: EM | Admit: 2022-04-05 | Discharge: 2022-04-05 | Disposition: A | Discharge status: Home / Self Care | Payer: Medicaid Other | Attending: Internal Medicine | Admitting: Internal Medicine

## 2022-04-20 ENCOUNTER — Emergency Department (HOSPITAL_COMMUNITY)
Admission: EM | Admit: 2022-04-20 | Discharge: 2022-04-20 | Disposition: A | Discharge status: Home / Self Care | Payer: Medicaid Other | Attending: Emergency Medicine | Admitting: Emergency Medicine

## 2022-04-20 ENCOUNTER — Encounter (HOSPITAL_COMMUNITY): Payer: Self-pay

## 2022-04-20 DIAGNOSIS — K0889 Other specified disorders of teeth and supporting structures: Secondary | ICD-10-CM

## 2022-04-20 MED ORDER — DICLOFENAC SODIUM 75 MG PO TBEC
75.0000 mg | DELAYED_RELEASE_TABLET | Freq: Two times a day (BID) | ORAL | 0 refills | Status: DC
Start: 2022-04-20 — End: 2022-09-09

## 2022-04-20 MED ORDER — AMOXICILLIN 500 MG PO CAPS: 500.0000 mg | ORAL_CAPSULE | Freq: Three times a day (TID) | ORAL | 0 refills | Status: DC

## 2022-04-20 NOTE — ED Triage Notes (Signed)
C/o dental pain throughout her mouth, was seen at dentist and needs her wisdom teeth removed but also having sensitivity on other teeth.

## 2022-04-20 NOTE — ED Provider Notes (Signed)
[place] EMERGENCY DEPARTMENT AT [facility] Provider Note   CSN: [id number] Arrival date & time: 04/20/22  [id number]     History  Chief Complaint  Patient presents with   Dental Pain    Bonnie Cobb is a 27 y.o. female.  Pt complains of a toothache.  Pt report she needs her wisdom teeth out.  Pt does not have a dentist.  The history is provided by the patient. No language interpreter was used.  Dental Pain Location:  Generalized Quality:  Aching Severity:  Moderate Timing:  Constant Progression:  Worsening Chronicity:  New Previous work-up:  Dental exam Relieved by:  Nothing Associated symptoms: facial pain        Home Medications Prior to Admission medications   Medication Sig Start Date End Date Taking? Authorizing Provider  albuterol (VENTOLIN HFA) 108 (90 Base) MCG/ACT inhaler Inhale 1-2 puffs into the lungs every 6 (six) hours as needed for wheezing or shortness of breath. 06/16/21   Raspet, Derry Skill, PA-C  atomoxetine (STRATTERA) 25 MG capsule Take 25 mg by mouth daily at 2 PM.    [provider]  cloNIDine (CATAPRES) 0.3 MG tablet Take 0.3 mg by mouth 2 (two) times daily.    [provider]  cyclobenzaprine (FLEXERIL) 10 MG tablet Take 1 tablet (10 mg total) by mouth 3 (three) times daily as needed for muscle spasms. 03/26/21   Barrett Henle, MD  famotidine (PEPCID) 20 MG tablet Take 1 tablet (20 mg total) by mouth 2 (two) times daily as needed for heartburn or indigestion. 05/29/21   Petrucelli, Samantha R, PA-C  fluticasone (FLONASE) 50 MCG/ACT nasal spray Place 1 spray into both nostrils 2 (two) times daily. 03/31/20   Volney American, PA-C  metroNIDAZOLE (FLAGYL) 500 MG tablet Take 1 tablet (500 mg total) by mouth 2 (two) times daily. 09/26/21   Lamptey, Myrene Galas, MD  ondansetron (ZOFRAN-ODT) 4 MG disintegrating tablet Take 1 tablet (4 mg total) by mouth every 8 (eight) hours as needed for nausea or vomiting. 05/29/21    Petrucelli, Aldona Bar R, PA-C  predniSONE (STERAPRED UNI-PAK 21 TAB) 10 MG (21) TBPK tablet As directed 06/16/21   Raspet, Erin K, PA-C  Prenatal Vit-Fe Fumarate-FA (PREPLUS) 27-1 MG TABS Take 1 tablet by mouth daily. 10/24/21   Gavin Pound, CNM  traZODone (DESYREL) 50 MG tablet Take 50 mg by mouth at bedtime. 01/22/21   [provider]  triamcinolone ointment (KENALOG) 0.1 % Apply 1 application topically 2 (two) times daily. 04/28/21   Sherrill Raring, PA-C      Allergies    Other and Pineapple    Review of Systems   Review of Systems  All other systems reviewed and are negative.   Physical Exam Updated Vital Signs BP (!) 123/95 (BP Location: Right Arm)   Pulse 83   Temp 97.8 F (36.6 C) (Oral)   Resp 18   SpO2 100%  Physical Exam Vitals and nursing note reviewed.  Constitutional:      Appearance: She is well-developed.  HENT:     Head: Normocephalic.     Mouth/Throat:     Mouth: Mucous membranes are moist.  Eyes:     Pupils: Pupils are equal, round, and reactive to light.  Cardiovascular:     Rate and Rhythm: Normal rate.  Pulmonary:     Effort: Pulmonary effort is normal.  Abdominal:     General: There is no distension.  Musculoskeletal:  General: Normal range of motion.     Cervical back: Normal range of motion.  Skin:    General: Skin is warm.  Neurological:     General: No focal deficit present.     Mental Status: She is alert and oriented to person, place, and time.     ED Results / Procedures / Treatments   Labs (all labs ordered are listed, but only abnormal results are displayed) Labs Reviewed - No data to display  EKG None  Radiology No results found.  Procedures Procedures    Medications Ordered in ED Medications - No data to display  ED Course/ Medical Decision Making/ A&P                             Medical Decision Making Pt complains of dental pain    Risk Prescription drug management. Risk Details: Pt given referral  to denitst.  Rx for amoxicillian and voltaren            Final Clinical Impression(s) / ED Diagnoses Final diagnoses:  Pain, dental    Rx / DC Orders ED Discharge Orders          Ordered    amoxicillin (AMOXIL) 500 MG capsule  3 times daily        04/20/22 0942    diclofenac (VOLTAREN) 75 MG EC tablet  2 times daily        04/20/22 [id number]          An After Visit Summary was printed and given to the patient.     Fransico Meadow, [place] 04/20/22 [id number]    Ezequiel Essex, MD 04/20/22 [phone]

## 2022-04-24 DIAGNOSIS — Z629 Problem related to upbringing, unspecified: Secondary | ICD-10-CM | POA: Diagnosis not present

## 2022-04-24 DIAGNOSIS — F331 Major depressive disorder, recurrent, moderate: Secondary | ICD-10-CM | POA: Diagnosis not present

## 2022-04-24 DIAGNOSIS — F4312 Post-traumatic stress disorder, chronic: Secondary | ICD-10-CM | POA: Diagnosis not present

## 2022-04-30 DIAGNOSIS — Z3202 Encounter for pregnancy test, result negative: Secondary | ICD-10-CM | POA: Diagnosis not present

## 2022-04-30 DIAGNOSIS — Z113 Encounter for screening for infections with a predominantly sexual mode of transmission: Secondary | ICD-10-CM | POA: Diagnosis not present

## 2022-04-30 DIAGNOSIS — A5901 Trichomonal vulvovaginitis: Secondary | ICD-10-CM | POA: Diagnosis not present

## 2022-04-30 DIAGNOSIS — Z114 Encounter for screening for human immunodeficiency virus [HIV]: Secondary | ICD-10-CM | POA: Diagnosis not present

## 2022-05-02 DIAGNOSIS — Z629 Problem related to upbringing, unspecified: Secondary | ICD-10-CM | POA: Diagnosis not present

## 2022-05-02 DIAGNOSIS — F4312 Post-traumatic stress disorder, chronic: Secondary | ICD-10-CM | POA: Diagnosis not present

## 2022-05-09 DIAGNOSIS — F4312 Post-traumatic stress disorder, chronic: Secondary | ICD-10-CM | POA: Diagnosis not present

## 2022-05-09 DIAGNOSIS — F331 Major depressive disorder, recurrent, moderate: Secondary | ICD-10-CM | POA: Diagnosis not present

## 2022-05-09 DIAGNOSIS — Z629 Problem related to upbringing, unspecified: Secondary | ICD-10-CM | POA: Diagnosis not present

## 2022-05-16 DIAGNOSIS — F419 Anxiety disorder, unspecified: Secondary | ICD-10-CM | POA: Diagnosis not present

## 2022-05-16 DIAGNOSIS — Z629 Problem related to upbringing, unspecified: Secondary | ICD-10-CM | POA: Diagnosis not present

## 2022-05-16 DIAGNOSIS — F909 Attention-deficit hyperactivity disorder, unspecified type: Secondary | ICD-10-CM | POA: Diagnosis not present

## 2022-05-16 DIAGNOSIS — F411 Generalized anxiety disorder: Secondary | ICD-10-CM | POA: Diagnosis not present

## 2022-05-16 DIAGNOSIS — F4312 Post-traumatic stress disorder, chronic: Secondary | ICD-10-CM | POA: Diagnosis not present

## 2022-05-16 DIAGNOSIS — F331 Major depressive disorder, recurrent, moderate: Secondary | ICD-10-CM | POA: Diagnosis not present

## 2022-05-20 ENCOUNTER — Emergency Department (HOSPITAL_COMMUNITY)
Admission: EM | Admit: 2022-05-20 | Discharge: 2022-05-21 | Disposition: A | Discharge status: Home / Self Care | Payer: 59 | Attending: Emergency Medicine | Admitting: Emergency Medicine

## 2022-05-20 ENCOUNTER — Encounter (HOSPITAL_COMMUNITY): Payer: Self-pay

## 2022-05-20 DIAGNOSIS — M62838 Other muscle spasm: Secondary | ICD-10-CM | POA: Diagnosis not present

## 2022-05-20 LAB — I-STAT CHEM 8, ED
BUN: 6 mg/dL (ref 6–20)
Calcium, Ion: 1.25 mmol/L (ref 1.15–1.40)
Chloride: 101 mmol/L (ref 98–111)
Creatinine, Ser: 0.6 mg/dL (ref 0.44–1.00)
Glucose, Bld: 87 mg/dL (ref 70–99)
HCT: 37 % (ref 36.0–46.0)
Hemoglobin: 12.6 g/dL (ref 12.0–15.0)
Potassium: 3.7 mmol/L (ref 3.5–5.1)
Sodium: 139 mmol/L (ref 135–145)
TCO2: 26 mmol/L (ref 22–32)

## 2022-05-20 MED ORDER — LIDOCAINE 5 % EX PTCH
1.0000 | MEDICATED_PATCH | CUTANEOUS | Status: DC
  Administered 2022-05-21: 1 via TRANSDERMAL
  Filled 2022-05-20: qty 1

## 2022-05-20 MED ORDER — NAPROXEN 500 MG PO TABS
500.0000 mg | ORAL_TABLET | Freq: Once | ORAL | Status: AC
  Administered 2022-05-20: 500 mg via ORAL
  Filled 2022-05-20: qty 1

## 2022-05-20 MED ORDER — DIAZEPAM 2 MG PO TABS
2.0000 mg | ORAL_TABLET | Freq: Once | ORAL | Status: AC
  Administered 2022-05-20: 2 mg via ORAL
  Filled 2022-05-20: qty 1

## 2022-05-20 NOTE — ED Provider Notes (Signed)
[place] EMERGENCY DEPARTMENT AT [facility] Provider Note   CSN: [id number] Arrival date & time: 05/20/22  2228     History {Add pertinent medical, surgical, social history, OB history to HPI:1} Chief Complaint  Patient presents with   Spasms    Bonnie Cobb is a 27 y.o. female.  27 year old female presents to the emergency department for evaluation of a muscle spasm which began at 1900.  It is present in her right axilla and upper outer corner of her right breast.  She states that she can have muscle spasms associated with worsening anxiety.  She tried Atarax earlier for symptoms with mild relief.  Presently complains of 10/10 pain from her muscle spasm.  It is aggravated by abducting her right arm.  She is right-hand dominant.  Works at [employer] and states that this work environment is very stressful for her; believes her job incited her symptoms tonight.  No hx of trauma, injury, or fever.  The history is provided by the patient. No language interpreter was used.       Home Medications Prior to Admission medications   Medication Sig Start Date End Date Taking? Authorizing Provider  albuterol (VENTOLIN HFA) 108 (90 Base) MCG/ACT inhaler Inhale 1-2 puffs into the lungs every 6 (six) hours as needed for wheezing or shortness of breath. 06/16/21   Raspet, Derry Skill, PA-C  amoxicillin (AMOXIL) 500 MG capsule Take 1 capsule (500 mg total) by mouth 3 (three) times daily. 04/20/22   Fransico Meadow, PA-C  atomoxetine (STRATTERA) 25 MG capsule Take 25 mg by mouth daily at 2 PM.    [provider]  cloNIDine (CATAPRES) 0.3 MG tablet Take 0.3 mg by mouth 2 (two) times daily.    [provider]  cyclobenzaprine (FLEXERIL) 10 MG tablet Take 1 tablet (10 mg total) by mouth 3 (three) times daily as needed for muscle spasms. 03/26/21   Barrett Henle, MD  diclofenac (VOLTAREN) 75 MG EC tablet Take 1 tablet (75 mg total) by mouth 2 (two) times daily. 04/20/22   Fransico Meadow, PA-C  famotidine (PEPCID) 20 MG tablet Take 1 tablet (20 mg total) by mouth 2 (two) times daily as needed for heartburn or indigestion. 05/29/21   Petrucelli, Samantha R, PA-C  fluticasone (FLONASE) 50 MCG/ACT nasal spray Place 1 spray into both nostrils 2 (two) times daily. 03/31/20   Volney American, PA-C  metroNIDAZOLE (FLAGYL) 500 MG tablet Take 1 tablet (500 mg total) by mouth 2 (two) times daily. 09/26/21   Lamptey, Myrene Galas, MD  ondansetron (ZOFRAN-ODT) 4 MG disintegrating tablet Take 1 tablet (4 mg total) by mouth every 8 (eight) hours as needed for nausea or vomiting. 05/29/21   Petrucelli, Aldona Bar R, PA-C  predniSONE (STERAPRED UNI-PAK 21 TAB) 10 MG (21) TBPK tablet As directed 06/16/21   Raspet, Erin K, PA-C  Prenatal Vit-Fe Fumarate-FA (PREPLUS) 27-1 MG TABS Take 1 tablet by mouth daily. 10/24/21   Gavin Pound, CNM  traZODone (DESYREL) 50 MG tablet Take 50 mg by mouth at bedtime. 01/22/21   [provider]  triamcinolone ointment (KENALOG) 0.1 % Apply 1 application topically 2 (two) times daily. 04/28/21   Sherrill Raring, PA-C      Allergies    Other and Pineapple    Review of Systems   Review of Systems Ten systems reviewed and are negative for acute change, except as noted in the HPI.    Physical Exam Updated Vital Signs BP Marland Kitchen)  142/95   Pulse 89   Temp 98.2 F (36.8 C) (Oral)   Resp 18   Ht '5\' 3"'$  (1.6 m)   Wt 132.5 kg   SpO2 100%   BMI 51.73 kg/m   Physical Exam Vitals and nursing note reviewed.  Constitutional:      General: She is not in acute distress.    Appearance: She is well-developed. She is not diaphoretic.     Comments: Nontoxic appearing, obese AA female  HENT:     Head: Normocephalic and atraumatic.  Eyes:     General: No scleral icterus.    Conjunctiva/sclera: Conjunctivae normal.  Cardiovascular:     Rate and Rhythm: Normal rate and regular rhythm.     Pulses: Normal pulses.     Comments: Distal radial pulse 2+ in the  RUE Pulmonary:     Effort: Pulmonary effort is normal. No respiratory distress.     Breath sounds: No stridor. No wheezing.     Comments: Respirations even and unlabored Chest:    Musculoskeletal:        General: Normal range of motion.     Cervical back: Normal range of motion.  Skin:    General: Skin is warm and dry.     Coloration: Skin is not pale.     Findings: No erythema or rash.  Neurological:     Mental Status: She is alert and oriented to person, place, and time.  Psychiatric:        Mood and Affect: Mood is anxious.        Behavior: Behavior normal.     ED Results / Procedures / Treatments   Labs (all labs ordered are listed, but only abnormal results are displayed) Labs Reviewed  I-STAT CHEM 8, ED    EKG None  Radiology No results found.  Procedures Procedures  {Document cardiac monitor, telemetry assessment procedure when appropriate:1}  Medications Ordered in ED Medications  lidocaine (LIDODERM) 5 % 1 patch (has no administration in time range)  diazepam (VALIUM) tablet 2 mg (2 mg Oral Given 05/20/22 2316)  naproxen (NAPROSYN) tablet 500 mg (500 mg Oral Given 05/20/22 2316)    ED Course/ Medical Decision Making/ A&P   {   Click here for ABCD2, HEART and other calculatorsREFRESH Note before signing :1}                          Medical Decision Making Risk Prescription drug management.   ***  {Document critical care time when appropriate:1} {Document review of labs and clinical decision tools ie heart score, Chads2Vasc2 etc:1}  {Document your independent review of radiology images, and any outside records:1} {Document your discussion with family members, caretakers, and with consultants:1} {Document social determinants of health affecting pt's care:1} {Document your decision making why or why not admission, treatments were needed:1} Final Clinical Impression(s) / ED Diagnoses Final diagnoses:  None    Rx / DC Orders ED Discharge Orders      None

## 2022-05-20 NOTE — ED Triage Notes (Signed)
Patient arrived from work who states she had a muscle spasm that started at 7pm around right axilla

## 2022-05-21 DIAGNOSIS — M62838 Other muscle spasm: Secondary | ICD-10-CM | POA: Diagnosis not present

## 2022-05-21 NOTE — Discharge Instructions (Addendum)
Your workup in the emergency department has been reassuring.  We recommend follow-up with your primary care doctor as needed.

## 2022-05-22 ENCOUNTER — Telehealth: Payer: Self-pay

## 2022-05-23 DIAGNOSIS — Z629 Problem related to upbringing, unspecified: Secondary | ICD-10-CM | POA: Diagnosis not present

## 2022-05-23 DIAGNOSIS — F4312 Post-traumatic stress disorder, chronic: Secondary | ICD-10-CM | POA: Diagnosis not present

## 2022-06-06 DIAGNOSIS — F4312 Post-traumatic stress disorder, chronic: Secondary | ICD-10-CM | POA: Diagnosis not present

## 2022-06-06 DIAGNOSIS — Z629 Problem related to upbringing, unspecified: Secondary | ICD-10-CM | POA: Diagnosis not present

## 2022-06-13 NOTE — Telephone Encounter (Signed)
error 

## 2022-06-18 ENCOUNTER — Encounter: Payer: 59 | Admitting: Internal Medicine

## 2022-07-05 ENCOUNTER — Encounter: Payer: 59 | Admitting: Internal Medicine

## 2022-07-05 ENCOUNTER — Encounter: Payer: Self-pay | Admitting: Internal Medicine

## 2022-07-05 ENCOUNTER — Ambulatory Visit (INDEPENDENT_AMBULATORY_CARE_PROVIDER_SITE_OTHER): Payer: No Typology Code available for payment source | Admitting: Internal Medicine

## 2022-07-05 VITALS — BP 114/66 | HR 97 | Temp 98.3°F | Resp 16 | Ht 63.0 in | Wt 294.0 lb

## 2022-07-05 DIAGNOSIS — R7303 Prediabetes: Secondary | ICD-10-CM

## 2022-07-05 DIAGNOSIS — Z124 Encounter for screening for malignant neoplasm of cervix: Secondary | ICD-10-CM | POA: Insufficient documentation

## 2022-07-05 DIAGNOSIS — Z6841 Body Mass Index (BMI) 40.0 and over, adult: Secondary | ICD-10-CM | POA: Diagnosis not present

## 2022-07-05 DIAGNOSIS — D500 Iron deficiency anemia secondary to blood loss (chronic): Secondary | ICD-10-CM | POA: Diagnosis not present

## 2022-07-05 DIAGNOSIS — R062 Wheezing: Secondary | ICD-10-CM

## 2022-07-05 DIAGNOSIS — D75839 Thrombocytosis, unspecified: Secondary | ICD-10-CM | POA: Insufficient documentation

## 2022-07-05 DIAGNOSIS — E6609 Other obesity due to excess calories: Secondary | ICD-10-CM | POA: Insufficient documentation

## 2022-07-05 DIAGNOSIS — R778 Other specified abnormalities of plasma proteins: Secondary | ICD-10-CM

## 2022-07-05 LAB — HEMOGLOBIN A1C: Hgb A1c MFr Bld: 6 % (ref 4.6–6.5)

## 2022-07-05 LAB — CBC WITH DIFFERENTIAL/PLATELET
Basophils Absolute: 0.1 10*3/uL (ref 0.0–0.1)
Basophils Relative: 1 % (ref 0.0–3.0)
Eosinophils Absolute: 0.4 10*3/uL (ref 0.0–0.7)
Eosinophils Relative: 4.5 % (ref 0.0–5.0)
HCT: 36 % (ref 36.0–46.0)
Hemoglobin: 11.7 g/dL — ABNORMAL LOW (ref 12.0–15.0)
Lymphocytes Relative: 38.4 % (ref 12.0–46.0)
Lymphs Abs: 3.2 10*3/uL (ref 0.7–4.0)
MCHC: 32.6 g/dL (ref 30.0–36.0)
MCV: 72.7 fl — ABNORMAL LOW (ref 78.0–100.0)
Monocytes Absolute: 0.5 10*3/uL (ref 0.1–1.0)
Monocytes Relative: 6.1 % (ref 3.0–12.0)
Neutro Abs: 4.1 10*3/uL (ref 1.4–7.7)
Neutrophils Relative %: 50 % (ref 43.0–77.0)
Platelets: 433 10*3/uL — ABNORMAL HIGH (ref 150.0–400.0)
RBC: 4.94 Mil/uL (ref 3.87–5.11)
RDW: 16.7 % — ABNORMAL HIGH (ref 11.5–15.5)
WBC: 8.2 10*3/uL (ref 4.0–10.5)

## 2022-07-05 LAB — HEPATIC FUNCTION PANEL
ALT: 18 U/L (ref 0–35)
AST: 22 U/L (ref 0–37)
Albumin: 4.3 g/dL (ref 3.5–5.2)
Alkaline Phosphatase: 64 U/L (ref 39–117)
Bilirubin, Direct: 0.1 mg/dL (ref 0.0–0.3)
Total Bilirubin: 0.4 mg/dL (ref 0.2–1.2)
Total Protein: 8.5 g/dL — ABNORMAL HIGH (ref 6.0–8.3)

## 2022-07-05 LAB — TSH: TSH: 0.87 u[IU]/mL (ref 0.35–5.50)

## 2022-07-05 LAB — IBC + FERRITIN
Ferritin: 17.4 ng/mL (ref 10.0–291.0)
Iron: 39 ug/dL — ABNORMAL LOW (ref 42–145)
Saturation Ratios: 8.8 % — ABNORMAL LOW (ref 20.0–50.0)
TIBC: 441 ug/dL (ref 250.0–450.0)
Transferrin: 315 mg/dL (ref 212.0–360.0)

## 2022-07-05 MED ORDER — ACCRUFER 30 MG PO CAPS: 1.0000 | ORAL_CAPSULE | Freq: Two times a day (BID) | ORAL | 1 refills | Status: DC

## 2022-07-05 NOTE — Progress Notes (Signed)
Subjective:  Patient ID: Bonnie Cobb, female    DOB: [birth year]  Age: 27 y.o. MRN: [id number]  CC: Anemia   HPI Bonnie Cobb presents for f/up ---   She complains of wheezing and requests an albuterol refill. She does not exercise because she is limited by knee pain.  Outpatient Medications Prior to Visit  Medication Sig Dispense Refill   diclofenac (VOLTAREN) 75 MG EC tablet Take 1 tablet (75 mg total) by mouth 2 (two) times daily. 20 tablet 0   famotidine (PEPCID) 20 MG tablet Take 1 tablet (20 mg total) by mouth 2 (two) times daily as needed for heartburn or indigestion. 15 tablet 0   fluticasone (FLONASE) 50 MCG/ACT nasal spray Place 1 spray into both nostrils 2 (two) times daily. 16 g 1   Prenatal Vit-Fe Fumarate-FA (PREPLUS) 27-1 MG TABS Take 1 tablet by mouth daily. 30 tablet 13   traZODone (DESYREL) 50 MG tablet Take 50 mg by mouth at bedtime.     albuterol (VENTOLIN HFA) 108 (90 Base) MCG/ACT inhaler Inhale 1-2 puffs into the lungs every 6 (six) hours as needed for wheezing or shortness of breath. 18 g 1   amoxicillin (AMOXIL) 500 MG capsule Take 1 capsule (500 mg total) by mouth 3 (three) times daily. 30 capsule 0   atomoxetine (STRATTERA) 25 MG capsule Take 25 mg by mouth daily at 2 PM.     cloNIDine (CATAPRES) 0.3 MG tablet Take 0.3 mg by mouth 2 (two) times daily.     cyclobenzaprine (FLEXERIL) 10 MG tablet Take 1 tablet (10 mg total) by mouth 3 (three) times daily as needed for muscle spasms. 20 tablet 0   metroNIDAZOLE (FLAGYL) 500 MG tablet Take 1 tablet (500 mg total) by mouth 2 (two) times daily. 14 tablet 0   ondansetron (ZOFRAN-ODT) 4 MG disintegrating tablet Take 1 tablet (4 mg total) by mouth every 8 (eight) hours as needed for nausea or vomiting. 5 tablet 0   predniSONE (STERAPRED UNI-PAK 21 TAB) 10 MG (21) TBPK tablet As directed 21 tablet 0   triamcinolone ointment (KENALOG) 0.1 % Apply 1 application topically 2 (two) times daily. 30 g 0   No  facility-administered medications prior to visit.    ROS Review of Systems  Constitutional:  Positive for fatigue and unexpected weight change (wt gain). Negative for appetite change, diaphoresis and fever.  HENT: Negative.    Eyes: Negative.   Respiratory:  Positive for wheezing. Negative for cough, choking, chest tightness and shortness of breath.   Cardiovascular: Negative.  Negative for chest pain, palpitations and leg swelling.  Gastrointestinal:  Negative for abdominal pain, constipation, diarrhea, nausea and vomiting.  Endocrine: Negative.   Genitourinary: Negative.  Negative for difficulty urinating.  Musculoskeletal:  Positive for arthralgias. Negative for back pain.  Skin: Negative.  Negative for color change.  Neurological: Negative.  Negative for dizziness and weakness.  Hematological:  Negative for adenopathy. Does not bruise/bleed easily.  Psychiatric/Behavioral: Negative.      Objective:  BP 114/66 (BP Location: Left Arm, Patient Position: Sitting, Cuff Size: Large)   Pulse 97   Temp 98.3 F (36.8 C) (Oral)   Resp 16   Ht 5\' 3"  (1.6 m)   Wt 294 lb (133.4 kg)   LMP 05/25/2022 (Exact Date)   SpO2 97%   BMI 52.08 kg/m   BP Readings from Last 3 Encounters:  07/05/22 114/66  05/20/22 (!) 142/95  04/20/22 (!) 123/95    Wt Readings from  Last 3 Encounters:  07/05/22 294 lb (133.4 kg)  05/20/22 292 lb (132.5 kg)  10/24/21 274 lb 4.8 oz (124.4 kg)    Physical Exam Vitals reviewed.  Constitutional:      Appearance: She is obese. She is not ill-appearing.  HENT:     Mouth/Throat:     Mouth: Mucous membranes are moist.  Eyes:     General: No scleral icterus. Cardiovascular:     Rate and Rhythm: Normal rate and regular rhythm.     Heart sounds: No murmur heard. Pulmonary:     Effort: Pulmonary effort is normal.     Breath sounds: No stridor. No wheezing, rhonchi or rales.  Abdominal:     General: Abdomen is protuberant. Bowel sounds are normal.      Palpations: There is no hepatomegaly, splenomegaly or mass.     Tenderness: There is no abdominal tenderness. There is no guarding.  Musculoskeletal:        General: Deformity present. No swelling. Normal range of motion.     Cervical back: Neck supple.     Right lower leg: No edema.     Left lower leg: No edema.  Lymphadenopathy:     Cervical: No cervical adenopathy.  Skin:    General: Skin is warm and dry.  Neurological:     General: No focal deficit present.     Mental Status: She is alert.  Psychiatric:        Mood and Affect: Mood normal.        Behavior: Behavior normal.     Lab Results  Component Value Date   WBC 8.2 07/05/2022   HGB 11.7 (L) 07/05/2022   HCT 36.0 07/05/2022   PLT 433.0 (H) 07/05/2022   GLUCOSE 87 05/20/2022   ALT 18 07/05/2022   AST 22 07/05/2022   NA 139 05/20/2022   K 3.7 05/20/2022   CL 101 05/20/2022   CREATININE 0.60 05/20/2022   BUN 6 05/20/2022   CO2 26 05/29/2021   TSH 0.87 07/05/2022   INR 1.1 11/04/2019   HGBA1C 6.0 07/05/2022    No results found.  Assessment & Plan:   Morbid obesity with BMI of 50.0-59.9, adult (HCC)- She is prediabetic but labs are negative for secondary causes. -     TSH; Future -     Hepatic function panel; Future -     Hemoglobin A1c; Future  Thrombocytosis- Will treat the iron deficiency anemia. -     IBC + Ferritin; Future -     CBC with Differential/Platelet; Future -     ACCRUFeR; Take 1 capsule (30 mg total) by mouth in the morning and at bedtime.  Dispense: 180 capsule; Refill: 1  Screening for cervical cancer -     Ambulatory referral to Gynecology  Iron deficiency anemia due to chronic blood loss -     ACCRUFeR; Take 1 capsule (30 mg total) by mouth in the morning and at bedtime.  Dispense: 180 capsule; Refill: 1  Prediabetes- She will work on her lifestyle modifications.  Elevated total protein- Will recheck this in 3 months.  Wheezing -     Albuterol Sulfate HFA; Inhale 1-2 puffs into  the lungs every 6 (six) hours as needed for wheezing or shortness of breath.  Dispense: 18 g; Refill: 1     Follow-up: Return in about 3 months (around 10/04/2022).  Sanda Linger, MD

## 2022-07-05 NOTE — Patient Instructions (Signed)
Essential Thrombocythemia  Essential thrombocythemia is a condition in which a person has too many platelets (thrombocytes) in the blood. Platelets are parts of blood that stick together and form a clot (thrombus) to help the body stop bleeding after an injury. This condition may also be called primary or essential thrombocytosis. Essential thrombocythemia happens when abnormal cells in the bone marrow (megakaryocytes) make too many platelets. What are the causes? The cause of this condition is not known. What are the signs or symptoms? This condition may not cause any symptoms. If you have symptoms, they may include: Blood clots. Weakness. Headache. Itching. Sweating or fever. Dizziness or confusion. Tingling or burning in your hands or feet. Symptoms may also include bleeding and an enlarged spleen. How is this diagnosed? This condition may be diagnosed based on: A physical exam. Your symptoms. Your medical history. Blood tests. A procedure to collect a sample of your bone marrow (bone marrow aspiration) for testing. How is this treated? If you do not have symptoms, you may not need treatment. Your health care provider may monitor your condition with regular blood tests. If you have symptoms, or if your platelet count is very high, you may be treated with: Aspirin or other medicines to thin your blood and help prevent blood clots. Medicines to reduce the number of platelets in your blood. A procedure to remove some platelets from your blood (plateletpheresis). During this procedure: Your health care provider will place an IV into one of your veins. The IV will be used to draw your blood into a machine that separates out the extra platelets. The blood with reduced platelets will be returned to your body. Follow these instructions at home: Medicines Take over-the-counter and prescription medicines only as told by your health care provider. If you are taking blood thinners: Talk  with your health care provider before you take any medicines that contain aspirin or NSAIDs, such as ibuprofen. These medicines increase your risk for dangerous bleeding. Take your medicine exactly as told, at the same time every day. Avoid activities that could cause injury or bruising, and follow instructions about how to prevent falls. Wear a medical alert bracelet or carry a card that lists what medicines you take. Tell all health care providers, including dental health care providers, about any medicines you are taking to prevent blood clots. General instructions Do not use any products that contain nicotine or tobacco. These products include cigarettes, chewing tobacco, and vaping devices, such as e-cigarettes. If you need help quitting, ask your health care provider. Ask your health care provider about managing or preventing high cholesterol, high blood pressure, and diabetes. These conditions can make essential thrombocythemia worse. Keep all follow-up visits. This is important. Contact a health care provider if: You have severe pain, and medicines do not help. You have problems taking your medicines to prevent blood clots. You have new pain, warmth, swelling, or redness in an arm or leg. This could mean you have a blood clot. You faint. You have unusual bruises. You have bloody or tarry stools. You have pink or bloody urine. Your menstrual periods are heavier than normal, if applicable. You have nosebleeds, bleeding gums, or other bleeding. Get help right away if:  You have chest pain. You have trouble breathing. You have any symptoms of a stroke. "BE FAST" is an easy way to remember the main warning signs of a stroke: B - Balance. Signs are dizziness, sudden trouble walking, or loss of balance. E - Eyes. Signs are   trouble seeing or a sudden change in vision. F - Face. Signs are sudden weakness or numbness of the face, or the face or eyelid drooping on one side. A - Arm. Signs are  weakness or numbness in an arm. This happens suddenly and usually on one side of the body. S - Speech. Signs are sudden trouble speaking, slurred speech, or trouble understanding what people say. T - Time. Time to call emergency services. Write down what time symptoms started. You have other signs of a stroke, such as: A sudden, severe headache with no known cause. Nausea or vomiting. Seizure. These symptoms may represent a serious problem that is an emergency. Do not wait to see if the symptoms will go away. Get medical help right away. Call your local emergency services (911 in the [place] Do not drive yourself to the hospital. Summary Essential thrombocythemia happens when abnormal cells in the bone marrow make too many platelets. If you have symptoms, or if your platelet count is very high, you may need treatment. Treatment can vary and may include medicines to thin the blood and prevent blood clots. Ask your health care provider about how to manage or prevent high cholesterol, high blood pressure, and diabetes. All of these can make essential thrombocythemia worse. Get help right away if you have any symptoms of stroke. This information is not intended to replace advice given to you by your health care provider. Make sure you discuss any questions you have with your health care provider. Document Revised: 08/29/2020 Document Reviewed: 08/29/2020 Elsevier Patient Education  2023 [employer]vier Inc[employer]  

## 2022-07-06 ENCOUNTER — Encounter: Payer: Self-pay | Admitting: Internal Medicine

## 2022-07-06 DIAGNOSIS — R062 Wheezing: Secondary | ICD-10-CM | POA: Insufficient documentation

## 2022-07-06 MED ORDER — ALBUTEROL SULFATE HFA 108 (90 BASE) MCG/ACT IN AERS: 1.0000 | INHALATION_SPRAY | Freq: Four times a day (QID) | RESPIRATORY_TRACT | 1 refills | Status: AC | PRN

## 2022-09-09 ENCOUNTER — Emergency Department (HOSPITAL_COMMUNITY)
Admission: EM | Admit: 2022-09-09 | Discharge: 2022-09-09 | Disposition: A | Discharge status: Home / Self Care | Payer: Medicaid Other | Attending: Emergency Medicine | Admitting: Emergency Medicine

## 2022-09-09 ENCOUNTER — Encounter (HOSPITAL_COMMUNITY): Payer: Self-pay

## 2022-09-09 DIAGNOSIS — K0889 Other specified disorders of teeth and supporting structures: Secondary | ICD-10-CM

## 2022-09-09 MED ORDER — AMOXICILLIN-POT CLAVULANATE 875-125 MG PO TABS
1.0000 | ORAL_TABLET | Freq: Two times a day (BID) | ORAL | 0 refills | Status: DC
Start: 2022-09-09 — End: 2023-01-16

## 2022-09-09 MED ORDER — NAPROXEN 500 MG PO TABS
500.0000 mg | ORAL_TABLET | Freq: Two times a day (BID) | ORAL | 0 refills | Status: DC
Start: 2022-09-09 — End: 2023-01-16

## 2022-09-09 MED ORDER — NAPROXEN 500 MG PO TABS
500.0000 mg | ORAL_TABLET | Freq: Once | ORAL | Status: AC
  Administered 2022-09-09: 500 mg via ORAL
  Filled 2022-09-09: qty 1

## 2022-09-09 NOTE — ED Provider Notes (Signed)
Bonnie Cobb Provider Note   CSN: [id number] Arrival date & time: 09/09/22  1419     History  Chief Complaint  Patient presents with   Dental Pain    Bonnie Cobb is a 27 y.o. female.  Patient presents to the emergency room with chief complaint of dental pain.  She states that for the past week she has had pain in the left upper mouth.  She states she had pain in the same area previously earlier in the month which self resolved.  She states she has had trouble with finding dentistry due to insurance coverage.  Past medical history otherwise significant for recurrent cold sores, iron deficiency anemia, prediabetes  HPI     Home Medications Prior to Admission medications   Medication Sig Start Date End Date Taking? Authorizing Provider  amoxicillin-clavulanate (AUGMENTIN) 875-125 MG tablet Take 1 tablet by mouth every 12 (twelve) hours. 09/09/22  Yes Darrick Grinder, PA-C  naproxen (NAPROSYN) 500 MG tablet Take 1 tablet (500 mg total) by mouth 2 (two) times daily. 09/09/22  Yes Darrick Grinder, PA-C  albuterol (VENTOLIN HFA) 108 (90 Base) MCG/ACT inhaler Inhale 1-2 puffs into the lungs every 6 (six) hours as needed for wheezing or shortness of breath. 07/06/22   Etta Grandchild, MD  famotidine (PEPCID) 20 MG tablet Take 1 tablet (20 mg total) by mouth 2 (two) times daily as needed for heartburn or indigestion. 05/29/21   Petrucelli, Pleas Koch, PA-C  Ferric Maltol (ACCRUFER) 30 MG CAPS Take 1 capsule (30 mg total) by mouth in the morning and at bedtime. 07/05/22   Etta Grandchild, MD  fluticasone (FLONASE) 50 MCG/ACT nasal spray Place 1 spray into both nostrils 2 (two) times daily. 03/31/20   Particia Nearing, PA-C  Prenatal Vit-Fe Fumarate-FA (PREPLUS) 27-1 MG TABS Take 1 tablet by mouth daily. 10/24/21   Gerrit Heck, CNM  traZODone (DESYREL) 50 MG tablet Take 50 mg by mouth at bedtime. 01/22/21   [provider]       Allergies    Other and Pineapple    Review of Systems   Review of Systems  Physical Exam Updated Vital Signs BP 129/75 (BP Location: Left Arm)   Pulse 97   Temp 98.7 F (37.1 C) (Oral)   Resp 20   SpO2 100%  Physical Exam Vitals and nursing note reviewed.  HENT:     Head: Normocephalic and atraumatic.     Mouth/Throat:     Dentition: Dental tenderness and gingival swelling present.   Eyes:     Pupils: Pupils are equal, round, and reactive to light.  Pulmonary:     Effort: Pulmonary effort is normal. No respiratory distress.  Musculoskeletal:        General: No signs of injury.     Cervical back: Normal range of motion.  Skin:    General: Skin is dry.  Neurological:     Mental Status: She is alert.  Psychiatric:        Speech: Speech normal.        Behavior: Behavior normal.     ED Results / Procedures / Treatments   Labs (all labs ordered are listed, but only abnormal results are displayed) Labs Reviewed - No data to display  EKG None  Radiology No results found.  Procedures Procedures    Medications Ordered in ED Medications  naproxen (NAPROSYN) tablet 500 mg (has no administration in time range)  ED Course/ Medical Decision Making/ A&P                             Medical Decision Making Risk Prescription drug management.   Patient presents with a chief complaint of dental pain.  Differential diagnosis includes but is not limited to dental abscess, gingival infection, dental injury, and others  The patient has a presentation which was similar earlier in the year.  She was treated with antibiotics as an outpatient.  She states she did not follow-up with dentistry due to affordability/insurance issues  No indication at this time for imaging or lab work.  The patient's vitals are normal with no fever.  She denies nausea, vomiting, fever at home.  No voice change or significant unilateral swelling to suggest PTA.  No clinical signs of  retropharyngeal abscess.  I ordered the patient Naprosyn for inflammation.  Upon reassessment the patient was feeling somewhat better.  Based on presentation patient's symptoms are most consistent with a gingival infection.  She has endorsed small bleeding at the gumline.  Plan to discharge home on antibiotics for dental coverage along with anti-inflammatories.  I have stressed the importance to the patient following up with dentistry.  I provided the contact information for the on-call dental provider.  I explained she may have to call multiple dentist before she is able to find one that may take her insurance.  She voices understanding with the plan and recommendations.  Discharge home at this time.        Final Clinical Impression(s) / ED Diagnoses Final diagnoses:  Pain, dental    Rx / DC Orders ED Discharge Orders          Ordered    naproxen (NAPROSYN) 500 MG tablet  2 times daily        09/09/22 1436    amoxicillin-clavulanate (AUGMENTIN) 875-125 MG tablet  Every 12 hours        09/09/22 1436              Pamala Duffel 09/09/22 1442    Terald Sleeper, MD 09/09/22 [phone]

## 2022-09-09 NOTE — Discharge Instructions (Addendum)
Were evaluated today for dental pain.  This may be due to an underlying infection.  I have prescribed antibiotics and anti-inflammatory medication.  Please take as directed.  It is important that you follow-up with dentistry for definitive management of your dental pain.

## 2022-09-09 NOTE — ED Triage Notes (Signed)
Pt arrived via POV, c/o left sided dental pain. Concern for abcess

## 2022-10-01 ENCOUNTER — Encounter: Payer: Self-pay | Admitting: Internal Medicine

## 2022-10-01 ENCOUNTER — Ambulatory Visit (INDEPENDENT_AMBULATORY_CARE_PROVIDER_SITE_OTHER): Payer: Self-pay | Admitting: Internal Medicine

## 2022-10-01 VITALS — BP 120/74 | HR 92 | Temp 98.7°F | Ht 63.0 in | Wt 285.0 lb

## 2022-10-01 DIAGNOSIS — D500 Iron deficiency anemia secondary to blood loss (chronic): Secondary | ICD-10-CM

## 2022-10-01 DIAGNOSIS — R778 Other specified abnormalities of plasma proteins: Secondary | ICD-10-CM

## 2022-10-01 DIAGNOSIS — N926 Irregular menstruation, unspecified: Secondary | ICD-10-CM

## 2022-10-01 DIAGNOSIS — D75839 Thrombocytosis, unspecified: Secondary | ICD-10-CM

## 2022-10-01 LAB — CBC WITH DIFFERENTIAL/PLATELET
Basophils Absolute: 0.1 10*3/uL (ref 0.0–0.1)
Basophils Relative: 1.1 % (ref 0.0–3.0)
Eosinophils Absolute: 0.2 10*3/uL (ref 0.0–0.7)
Eosinophils Relative: 2.5 % (ref 0.0–5.0)
HCT: 34.9 % — ABNORMAL LOW (ref 36.0–46.0)
Hemoglobin: 11.1 g/dL — ABNORMAL LOW (ref 12.0–15.0)
Lymphocytes Relative: 37.7 % (ref 12.0–46.0)
Lymphs Abs: 3.1 10*3/uL (ref 0.7–4.0)
MCHC: 31.8 g/dL (ref 30.0–36.0)
MCV: 73.5 fl — ABNORMAL LOW (ref 78.0–100.0)
Monocytes Absolute: 0.4 10*3/uL (ref 0.1–1.0)
Monocytes Relative: 4.9 % (ref 3.0–12.0)
Neutro Abs: 4.5 10*3/uL (ref 1.4–7.7)
Neutrophils Relative %: 53.8 % (ref 43.0–77.0)
Platelets: 460 10*3/uL — ABNORMAL HIGH (ref 150.0–400.0)
RBC: 4.75 Mil/uL (ref 3.87–5.11)
RDW: 16.5 % — ABNORMAL HIGH (ref 11.5–15.5)
WBC: 8.3 10*3/uL (ref 4.0–10.5)

## 2022-10-01 LAB — IBC + FERRITIN
Ferritin: 10.5 ng/mL (ref 10.0–291.0)
Iron: 23 ug/dL — ABNORMAL LOW (ref 42–145)
Saturation Ratios: 5.7 % — ABNORMAL LOW (ref 20.0–50.0)
TIBC: 403.2 ug/dL (ref 250.0–450.0)
Transferrin: 288 mg/dL (ref 212.0–360.0)

## 2022-10-01 LAB — HCG, QUANTITATIVE, PREGNANCY: Quantitative HCG: <0.6 m[IU]/mL

## 2022-10-01 NOTE — Patient Instructions (Signed)

## 2022-10-01 NOTE — Progress Notes (Signed)
Subjective:  Patient ID: Bonnie Cobb, female    DOB: [birth year]  Age: 27 y.o. MRN: [id number]  CC: Anemia   HPI Bonnie Cobb presents for f/up ----  Discussed the use of AI scribe software for clinical note transcription with the patient, who gave verbal consent to proceed.  History of Present Illness   The patient, with a history of anemia, reports feeling tired, which they attribute to their work schedule. They have noticed some improvement in their fatigue levels recently. They also report experiencing night sweats and mild chills but deny any sore throat or abdominal pain.  They have been struggling with weight gain and have recently started a new diet regimen, switching from juice and soda to water.  The patient's menstrual cycle is reportedly late, and they are unsure about the possibility of pregnancy. They have taken two pregnancy tests with conflicting results. They are sexually active and not on any form of contraception. They are currently taking amoxicillin and naproxen for a dental issue, and an inhaler, but deny taking any prenatal vitamins, Pepcid for heartburn, or trazodone for insomnia. They also mention taking hydroxyzine.  The patient reports no nausea, vomiting, or dizziness since starting their current medications.       Outpatient Medications Prior to Visit  Medication Sig Dispense Refill   albuterol (VENTOLIN HFA) 108 (90 Base) MCG/ACT inhaler Inhale 1-2 puffs into the lungs every 6 (six) hours as needed for wheezing or shortness of breath. 18 g 1   amoxicillin-clavulanate (AUGMENTIN) 875-125 MG tablet Take 1 tablet by mouth every 12 (twelve) hours. 14 tablet 0   famotidine (PEPCID) 20 MG tablet Take 1 tablet (20 mg total) by mouth 2 (two) times daily as needed for heartburn or indigestion. 15 tablet 0   Ferric Maltol (ACCRUFER) 30 MG CAPS Take 1 capsule (30 mg total) by mouth in the morning and at bedtime. 180 capsule 1   fluticasone (FLONASE) 50 MCG/ACT  nasal spray Place 1 spray into both nostrils 2 (two) times daily. 16 g 1   naproxen (NAPROSYN) 500 MG tablet Take 1 tablet (500 mg total) by mouth 2 (two) times daily. 30 tablet 0   traZODone (DESYREL) 50 MG tablet Take 50 mg by mouth at bedtime.     Prenatal Vit-Fe Fumarate-FA (PREPLUS) 27-1 MG TABS Take 1 tablet by mouth daily. 30 tablet 13   No facility-administered medications prior to visit.    ROS Review of Systems  Constitutional:  Positive for fatigue. Negative for chills, diaphoresis and unexpected weight change.  HENT: Negative.    Eyes: Negative.   Respiratory:  Negative for cough, chest tightness, shortness of breath and wheezing.   Cardiovascular:  Negative for chest pain, palpitations and leg swelling.  Gastrointestinal:  Negative for abdominal pain, constipation, diarrhea and vomiting.  Endocrine: Negative.   Genitourinary:  Positive for menstrual problem. Negative for difficulty urinating and pelvic pain.  Musculoskeletal: Negative.   Skin: Negative.   Neurological: Negative.  Negative for dizziness and weakness.  Hematological:  Negative for adenopathy. Does not bruise/bleed easily.  Psychiatric/Behavioral: Negative.      Objective:  BP 120/74 (BP Location: Left Arm, Patient Position: Sitting, Cuff Size: Large)   Pulse 92   Temp 98.7 F (37.1 C) (Oral)   Ht 5\' 3"  (1.6 m)   Wt 285 lb (129.3 kg)   LMP 08/12/2022 (Approximate)   SpO2 97%   BMI 50.49 kg/m   BP Readings from Last 3 Encounters:  10/01/22 120/74  09/09/22 129/75  07/05/22 114/66    Wt Readings from Last 3 Encounters:  10/01/22 285 lb (129.3 kg)  07/05/22 294 lb (133.4 kg)  05/20/22 292 lb (132.5 kg)    Physical Exam Vitals reviewed.  Constitutional:      Appearance: Normal appearance.  HENT:     Mouth/Throat:     Mouth: Mucous membranes are moist.  Eyes:     General: No scleral icterus.    Conjunctiva/sclera: Conjunctivae normal.  Cardiovascular:     Rate and Rhythm: Normal rate  and regular rhythm.     Heart sounds: No murmur heard. Pulmonary:     Effort: Pulmonary effort is normal.     Breath sounds: No stridor. No wheezing, rhonchi or rales.  Abdominal:     General: Abdomen is flat.     Palpations: There is no mass.     Tenderness: There is no abdominal tenderness. There is no guarding.     Hernia: No hernia is present.  Musculoskeletal:        General: Normal range of motion.     Cervical back: Neck supple.     Right lower leg: No edema.     Left lower leg: No edema.  Lymphadenopathy:     Cervical: No cervical adenopathy.  Skin:    General: Skin is warm and dry.     Findings: No rash.  Neurological:     General: No focal deficit present.     Mental Status: She is alert. Mental status is at baseline.  Psychiatric:        Mood and Affect: Mood normal.        Behavior: Behavior normal.     Lab Results  Component Value Date   WBC 8.3 10/01/2022   HGB 11.1 (L) 10/01/2022   HCT 34.9 (L) 10/01/2022   PLT 460.0 (H) 10/01/2022   GLUCOSE 87 05/20/2022   ALT 18 07/05/2022   AST 22 07/05/2022   NA 139 05/20/2022   K 3.7 05/20/2022   CL 101 05/20/2022   CREATININE 0.60 05/20/2022   BUN 6 05/20/2022   CO2 26 05/29/2021   TSH 0.87 07/05/2022   INR 1.1 11/04/2019   HGBA1C 6.0 07/05/2022    No results found.  Assessment & Plan:  Thrombocytosis -     IBC + Ferritin; Future -     Protein electrophoresis, serum; Future -     CBC with Differential/Platelet; Future  Elevated total protein- SPEP is normal. -     Protein electrophoresis, serum; Future -     CBC with Differential/Platelet; Future  Late menses- Beta-hCG is negative. -     hCG, quantitative, pregnancy; Future  Iron deficiency anemia due to chronic blood loss- Her iron level remains low despite oral iron supplementation.  I have referred her for iron infusion. -     Ambulatory referral to Hematology / Oncology     Follow-up: Return in about 6 months (around 04/03/2023).  Sanda Linger, MD

## 2022-10-04 ENCOUNTER — Encounter: Payer: Self-pay | Admitting: Internal Medicine

## 2022-10-04 LAB — PROTEIN ELECTROPHORESIS, SERUM
Albumin ELP: 3.7 g/dL — ABNORMAL LOW (ref 3.8–4.8)
Alpha 1: 0.3 g/dL (ref 0.2–0.3)
Alpha 2: 0.7 g/dL (ref 0.5–0.9)
Beta 2: 0.5 g/dL (ref 0.2–0.5)
Beta Globulin: 0.5 g/dL (ref 0.4–0.6)

## 2022-10-10 ENCOUNTER — Encounter: Payer: 59 | Admitting: Obstetrics and Gynecology

## 2022-11-25 ENCOUNTER — Emergency Department (HOSPITAL_COMMUNITY)
Admission: EM | Admit: 2022-11-25 | Discharge: 2022-11-25 | Disposition: A | Discharge status: Home / Self Care | Payer: Self-pay | Attending: Emergency Medicine | Admitting: Emergency Medicine

## 2022-11-25 ENCOUNTER — Emergency Department (HOSPITAL_COMMUNITY): Payer: Self-pay

## 2022-11-25 ENCOUNTER — Other Ambulatory Visit: Payer: Self-pay

## 2022-11-25 ENCOUNTER — Encounter (HOSPITAL_COMMUNITY): Payer: Self-pay

## 2022-11-25 DIAGNOSIS — Z23 Encounter for immunization: Secondary | ICD-10-CM | POA: Insufficient documentation

## 2022-11-25 DIAGNOSIS — S0990XA Unspecified injury of head, initial encounter: Secondary | ICD-10-CM | POA: Insufficient documentation

## 2022-11-25 DIAGNOSIS — W3400XA Accidental discharge from unspecified firearms or gun, initial encounter: Secondary | ICD-10-CM

## 2022-11-25 DIAGNOSIS — S62653B Nondisplaced fracture of medial phalanx of left middle finger, initial encounter for open fracture: Secondary | ICD-10-CM | POA: Insufficient documentation

## 2022-11-25 LAB — COMPREHENSIVE METABOLIC PANEL
ALT: 17 U/L (ref 0–44)
AST: 20 U/L (ref 15–41)
Albumin: 3.5 g/dL (ref 3.5–5.0)
Alkaline Phosphatase: 57 U/L (ref 38–126)
Anion gap: 12 (ref 5–15)
BUN: 8 mg/dL (ref 6–20)
CO2: 22 mmol/L (ref 22–32)
Calcium: 8.9 mg/dL (ref 8.9–10.3)
Chloride: 103 mmol/L (ref 98–111)
Creatinine, Ser: 0.82 mg/dL (ref 0.44–1.00)
GFR, Estimated: >60 mL/min (ref >60)
Glucose, Bld: 93 mg/dL (ref 70–99)
Potassium: 3.1 mmol/L — ABNORMAL LOW (ref 3.5–5.1)
Sodium: 137 mmol/L (ref 135–145)
Total Bilirubin: 0.4 mg/dL (ref 0.3–1.2)
Total Protein: 7.5 g/dL (ref 6.5–8.1)

## 2022-11-25 LAB — SAMPLE TO BLOOD BANK

## 2022-11-25 LAB — URINALYSIS, ROUTINE W REFLEX MICROSCOPIC
Bilirubin Urine: NEGATIVE
Glucose, UA: NEGATIVE mg/dL
Hgb urine dipstick: NEGATIVE
Ketones, ur: NEGATIVE mg/dL
Leukocytes,Ua: NEGATIVE
Nitrite: NEGATIVE
Protein, ur: 100 mg/dL — AB
Specific Gravity, Urine: >1.03 — ABNORMAL HIGH (ref 1.005–1.030)
pH: 6 (ref 5.0–8.0)

## 2022-11-25 LAB — I-STAT CHEM 8, ED
BUN: 7 mg/dL (ref 6–20)
Calcium, Ion: 1.19 mmol/L (ref 1.15–1.40)
Chloride: 104 mmol/L (ref 98–111)
Creatinine, Ser: 0.7 mg/dL (ref 0.44–1.00)
Glucose, Bld: 95 mg/dL (ref 70–99)
HCT: 35 % — ABNORMAL LOW (ref 36.0–46.0)
Hemoglobin: 11.9 g/dL — ABNORMAL LOW (ref 12.0–15.0)
Potassium: 3.3 mmol/L — ABNORMAL LOW (ref 3.5–5.1)
Sodium: 140 mmol/L (ref 135–145)
TCO2: 22 mmol/L (ref 22–32)

## 2022-11-25 LAB — URINALYSIS, MICROSCOPIC (REFLEX)

## 2022-11-25 LAB — CBC
HCT: 33.7 % — ABNORMAL LOW (ref 36.0–46.0)
Hemoglobin: 10.7 g/dL — ABNORMAL LOW (ref 12.0–15.0)
MCH: 23.8 pg — ABNORMAL LOW (ref 26.0–34.0)
MCHC: 31.8 g/dL (ref 30.0–36.0)
MCV: 75.1 fL — ABNORMAL LOW (ref 80.0–100.0)
Platelets: 434 10*3/uL — ABNORMAL HIGH (ref 150–400)
RBC: 4.49 MIL/uL (ref 3.87–5.11)
RDW: 15.8 % — ABNORMAL HIGH (ref 11.5–15.5)
WBC: 11.9 10*3/uL — ABNORMAL HIGH (ref 4.0–10.5)
nRBC: 0 % (ref 0.0–0.2)

## 2022-11-25 LAB — I-STAT CG4 LACTIC ACID, ED: Lactic Acid, Venous: 2.3 mmol/L (ref 0.5–1.9)

## 2022-11-25 MED ORDER — IOHEXOL 350 MG/ML SOLN
75.0000 mL | Freq: Once | INTRAVENOUS | Status: AC | PRN
  Administered 2022-11-25: 75 mL via INTRAVENOUS

## 2022-11-25 MED ORDER — TETANUS-DIPHTH-ACELL PERTUSSIS 5-2.5-18.5 LF-MCG/0.5 IM SUSY
0.5000 mL | PREFILLED_SYRINGE | Freq: Once | INTRAMUSCULAR | Status: AC
  Administered 2022-11-25: 0.5 mL via INTRAMUSCULAR
  Filled 2022-11-25: qty 0.5

## 2022-11-25 MED ORDER — DOXYCYCLINE HYCLATE 100 MG PO CAPS: 100.0000 mg | ORAL_CAPSULE | Freq: Two times a day (BID) | ORAL | 0 refills | Status: AC

## 2022-11-25 MED ORDER — MORPHINE SULFATE (PF) 4 MG/ML IV SOLN
4.0000 mg | Freq: Once | INTRAVENOUS | Status: AC
  Administered 2022-11-25: 4 mg via INTRAVENOUS
  Filled 2022-11-25: qty 1

## 2022-11-25 MED ORDER — OXYCODONE-ACETAMINOPHEN 5-325 MG PO TABS: 1.0000 | ORAL_TABLET | Freq: Four times a day (QID) | ORAL | 0 refills | Status: DC | PRN

## 2022-11-25 MED ORDER — SENNOSIDES-DOCUSATE SODIUM 8.6-50 MG PO TABS: 1.0000 | ORAL_TABLET | Freq: Every evening | ORAL | 0 refills | Status: DC | PRN

## 2022-11-25 MED ORDER — ONDANSETRON HCL 4 MG/2ML IJ SOLN
4.0000 mg | Freq: Once | INTRAMUSCULAR | Status: AC
  Administered 2022-11-25: 4 mg via INTRAVENOUS
  Filled 2022-11-25: qty 2

## 2022-11-25 MED ORDER — CEFAZOLIN SODIUM-DEXTROSE 2-4 GM/100ML-% IV SOLN
2.0000 g | Freq: Once | INTRAVENOUS | Status: AC
  Administered 2022-11-25: 2 g via INTRAVENOUS
  Filled 2022-11-25: qty 100

## 2022-11-25 MED ORDER — FENTANYL CITRATE PF 50 MCG/ML IJ SOSY
50.0000 ug | PREFILLED_SYRINGE | Freq: Once | INTRAMUSCULAR | Status: DC
  Filled 2022-11-25: qty 1
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
## 2022-11-25 NOTE — Progress Notes (Signed)
Orthopedic Tech Progress Note Patient Details:  Bonnie Cobb March 29, 1995 [id number]  Ortho Devices Type of Ortho Device: Finger splint Ortho Device/Splint Location: lue Ortho Device/Splint Interventions: Ordered, Application, Adjustment   Post Interventions Patient Tolerated: Well Instructions Provided: Care of device, Adjustment of device  Trinna Post 11/25/2022, 7:09 AM
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
## 2022-11-25 NOTE — ED Notes (Signed)
Patient transported to CT 
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
## 2022-11-26 ENCOUNTER — Emergency Department (HOSPITAL_COMMUNITY)
Admission: EM | Admit: 2022-11-26 | Discharge: 2022-11-26 | Discharge status: Against Medical Advice | Payer: Self-pay | Attending: Emergency Medicine | Admitting: Emergency Medicine

## 2022-11-26 DIAGNOSIS — M79645 Pain in left finger(s): Secondary | ICD-10-CM | POA: Insufficient documentation

## 2022-11-26 DIAGNOSIS — Z5321 Procedure and treatment not carried out due to patient leaving prior to being seen by health care provider: Secondary | ICD-10-CM | POA: Insufficient documentation
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
## 2022-12-03 ENCOUNTER — Encounter: Payer: 59 | Admitting: Obstetrics & Gynecology

## 2022-12-03 ENCOUNTER — Encounter: Payer: 59 | Admitting: Student

## 2023-01-15 ENCOUNTER — Encounter: Payer: Medicaid Other | Admitting: Obstetrics and Gynecology

## 2023-01-16 ENCOUNTER — Ambulatory Visit
Admission: EM | Admit: 2023-01-16 | Discharge: 2023-01-16 | Disposition: A | Discharge status: Home / Self Care | Payer: Medicaid Other | Attending: Internal Medicine | Admitting: Internal Medicine

## 2023-01-16 DIAGNOSIS — K047 Periapical abscess without sinus: Secondary | ICD-10-CM

## 2023-01-16 MED ORDER — AMOXICILLIN-POT CLAVULANATE 875-125 MG PO TABS: 1.0000 | ORAL_TABLET | Freq: Two times a day (BID) | ORAL | 0 refills | Status: AC

## 2023-01-16 MED ORDER — NAPROXEN 500 MG PO TABS: 500.0000 mg | ORAL_TABLET | Freq: Two times a day (BID) | ORAL | 0 refills | Status: DC | PRN
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
## 2023-01-16 NOTE — Discharge Instructions (Signed)
Start Augmentin twice daily for 10 days.  You may also take naproxen twice daily as needed for tooth pain.  Please follow-up with a dentist as soon as possible for further treatment of your symptoms.  Please go to the ER if you develop any worsening symptoms.  I hope you feel better soon!

## 2023-01-16 NOTE — ED Triage Notes (Signed)
Pt presents to UC w/ c/o dental pain and swelling on the upper left side x1 week. Pt reports she has a "bump on her gums" and is unable to chew on the left side or fully open her mouth. Pt has tried using warm salt water, rinsing with peroxide, diclofenac, one pill of doxycycline, and one dose of "half a vicodin" last night.

## 2023-03-04 ENCOUNTER — Telehealth (HOSPITAL_COMMUNITY): Payer: Self-pay

## 2023-03-04 ENCOUNTER — Ambulatory Visit (HOSPITAL_COMMUNITY)
Admission: EM | Admit: 2023-03-04 | Discharge: 2023-03-04 | Disposition: A | Discharge status: Home / Self Care | Payer: Medicaid Other | Attending: Family Medicine | Admitting: Family Medicine

## 2023-03-04 ENCOUNTER — Encounter (HOSPITAL_COMMUNITY): Payer: Self-pay

## 2023-03-04 DIAGNOSIS — Z5971 Insufficient health insurance coverage: Secondary | ICD-10-CM | POA: Diagnosis not present

## 2023-03-04 DIAGNOSIS — S61233D Puncture wound without foreign body of left middle finger without damage to nail, subsequent encounter: Secondary | ICD-10-CM | POA: Insufficient documentation

## 2023-03-04 DIAGNOSIS — M79604 Pain in right leg: Secondary | ICD-10-CM | POA: Insufficient documentation

## 2023-03-04 DIAGNOSIS — W3400XA Accidental discharge from unspecified firearms or gun, initial encounter: Secondary | ICD-10-CM | POA: Diagnosis not present

## 2023-03-04 DIAGNOSIS — M79645 Pain in left finger(s): Secondary | ICD-10-CM | POA: Insufficient documentation

## 2023-03-04 DIAGNOSIS — L03115 Cellulitis of right lower limb: Secondary | ICD-10-CM | POA: Diagnosis not present

## 2023-03-04 DIAGNOSIS — W3400XD Accidental discharge from unspecified firearms or gun, subsequent encounter: Secondary | ICD-10-CM | POA: Insufficient documentation

## 2023-03-04 MED ORDER — CLINDAMYCIN HCL 300 MG PO CAPS: 300.0000 mg | ORAL_CAPSULE | Freq: Three times a day (TID) | ORAL | 0 refills | Status: AC

## 2023-03-04 MED ORDER — LIDOCAINE HCL (PF) 1 % IJ SOLN
INTRAMUSCULAR | Status: AC
  Filled 2023-03-04: qty 2

## 2023-03-04 MED ORDER — CEFTRIAXONE SODIUM 1 G IJ SOLR
INTRAMUSCULAR | Status: AC
Start: 2023-03-04 — End: ?
  Filled 2023-03-04: qty 10

## 2023-03-04 MED ORDER — MUPIROCIN CALCIUM 2 % EX CREA: 1.0000 | TOPICAL_CREAM | Freq: Every day | CUTANEOUS | 0 refills | Status: DC

## 2023-03-04 MED ORDER — CEFTRIAXONE SODIUM 1 G IJ SOLR
1.0000 g | INTRAMUSCULAR | Status: DC
  Administered 2023-03-04: 1 g via INTRAMUSCULAR

## 2023-03-04 NOTE — ED Triage Notes (Signed)
Pt c/o wound to RLE x1.5 wks. States now hard and hot too touch. States lots of drainage.
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
## 2023-03-07 LAB — AEROBIC CULTURE W GRAM STAIN (SUPERFICIAL SPECIMEN): Gram Stain: NONE SEEN

## 2023-03-12 ENCOUNTER — Encounter (HOSPITAL_COMMUNITY): Payer: Self-pay

## 2023-03-12 ENCOUNTER — Other Ambulatory Visit: Payer: Self-pay

## 2023-03-12 ENCOUNTER — Emergency Department (HOSPITAL_COMMUNITY)
Admission: EM | Admit: 2023-03-12 | Discharge: 2023-03-13 | Disposition: A | Discharge status: Home / Self Care | Payer: Medicaid Other | Attending: Emergency Medicine | Admitting: Emergency Medicine

## 2023-03-12 DIAGNOSIS — S4991XA Unspecified injury of right shoulder and upper arm, initial encounter: Secondary | ICD-10-CM | POA: Diagnosis present

## 2023-03-12 DIAGNOSIS — S40011A Contusion of right shoulder, initial encounter: Secondary | ICD-10-CM | POA: Insufficient documentation

## 2023-03-12 DIAGNOSIS — W19XXXA Unspecified fall, initial encounter: Secondary | ICD-10-CM | POA: Diagnosis not present

## 2023-03-12 DIAGNOSIS — M25551 Pain in right hip: Secondary | ICD-10-CM | POA: Diagnosis not present

## 2023-03-12 MED ORDER — IBUPROFEN 800 MG PO TABS
800.0000 mg | ORAL_TABLET | Freq: Once | ORAL | Status: AC
  Administered 2023-03-12: 800 mg via ORAL
  Filled 2023-03-12: qty 1
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
## 2023-03-13 ENCOUNTER — Emergency Department (HOSPITAL_COMMUNITY): Payer: Medicaid Other

## 2023-03-13 NOTE — Discharge Instructions (Addendum)
 You were evaluated today for a fall.  You had x-rays of your neck, shoulder, and hip.  No evidence of broken bones was noted. Please use ibuprofen , cold and warm therapy, and gentle movement. Please follow-up with your doctor next week Please return if you are having any new or worsening symptoms.

## 2023-03-13 NOTE — ED Provider Notes (Signed)
 [place] EMERGENCY DEPARTMENT AT [facility] Provider Note   CSN: [id number] Arrival date & time: 03/12/23  2238     History  Chief Complaint  Patient presents with   [name]    Bonnie Cobb is a 28 y.o. female.  HPI 28 year old female presents today complaining of pain after fall off a drones last night.  She states the floor was slippery and she fell landing on her right side.  She struck her right shoulder her right hip and her head.  She did not lose consciousness.  She has been able to ambulate since with complaining of pain in the right shoulder and hip diffusely throughout the right side.  She is not on any blood thinners.     Home Medications Prior to Admission medications   Medication Sig Start Date End Date Taking? Authorizing Provider  albuterol  (VENTOLIN  HFA) 108 (90 Base) MCG/ACT inhaler Inhale 1-2 puffs into the lungs every 6 (six) hours as needed for wheezing or shortness of breath. 07/06/22   Joshua Debby CROME, MD  clindamycin  (CLEOCIN ) 300 MG capsule Take 1 capsule (300 mg total) by mouth 3 (three) times daily after meals for 10 days. 03/04/23 03/14/23  Ival Domino, FNP  famotidine  (PEPCID ) 20 MG tablet Take 1 tablet (20 mg total) by mouth 2 (two) times daily as needed for heartburn or indigestion. 05/29/21   Petrucelli, Samantha R, PA-C  Ferric Maltol  (ACCRUFER ) 30 MG CAPS Take 1 capsule (30 mg total) by mouth in the morning and at bedtime. 07/05/22   Joshua Debby CROME, MD  mupirocin  cream (BACTROBAN ) 2 % Apply 1 Application topically daily. 03/04/23   Ival Domino, FNP  naproxen  (NAPROSYN ) 500 MG tablet Take 1 tablet (500 mg total) by mouth 2 (two) times daily as needed (tooth pain). 01/16/23   Mayer, Jodi R, NP  senna-docusate (SENOKOT-S) 8.6-50 MG tablet Take 1 tablet by mouth at bedtime as needed for mild constipation. 11/25/22   Long, Joshua G, MD  traZODone (DESYREL) 50 MG tablet Take 50 mg by mouth at bedtime. 01/22/21   [provider]       Allergies    Bee venom, Other, and Pineapple    Review of Systems   Review of Systems  Physical Exam Updated Vital Signs BP (!) 105/56 (BP Location: Left Arm)   Pulse 77   Temp 97.9 F (36.6 C) (Oral)   Resp 14   LMP 02/19/2023 (Approximate)   SpO2 100%  Physical Exam Vitals and nursing note reviewed.  HENT:     Head: Normocephalic.     Right Ear: Tympanic membrane and external ear normal.     Left Ear: Tympanic membrane and external ear normal.     Ears:     Comments: No external signs of trauma on head    Nose: Nose normal.     Mouth/Throat:     Pharynx: Oropharynx is clear.  Eyes:     Extraocular Movements: Extraocular movements intact.     Pupils: Pupils are equal, round, and reactive to light.  Cardiovascular:     Rate and Rhythm: Normal rate and regular rhythm.     Pulses: Normal pulses.  Pulmonary:     Effort: Pulmonary effort is normal.  Abdominal:     General: Abdomen is flat.     Palpations: Abdomen is soft.  Musculoskeletal:        General: Normal range of motion.     Cervical back: Normal range of motion.  Comments: Right hip ttp, right shoulder mild righ paracervical ttp Full arom all above  Skin:    General: Skin is warm and dry.     Findings: No bruising.  Neurological:     General: No focal deficit present.     Mental Status: She is alert. Mental status is at baseline.  Psychiatric:        Mood and Affect: Mood normal.        Behavior: Behavior normal.     ED Results / Procedures / Treatments   Labs (all labs ordered are listed, but only abnormal results are displayed) Labs Reviewed - No data to display  EKG None  Radiology DG Cervical Spine 2-3 Views Result Date: 03/13/2023 CLINICAL DATA:  Pain post fall EXAM: CERVICAL SPINE - 2-3 VIEW COMPARISON:  CT 11/25/2022 FINDINGS: Chronic mild reversal of the normal cervical lordosis. No fracture, dislocation, or spondylolisthesis. No prevertebral soft tissue swelling. No significant  osseous degenerative change. IMPRESSION: 1. No acute findings. 2. Chronic mild reversal of the normal cervical lordosis. Electronically Signed   By: JONETTA Faes M.D.   On: 03/13/2023 09:49   DG Hip Unilat W or Wo Pelvis 2-3 Views Right Result Date: 03/13/2023 CLINICAL DATA:  Pain post fall EXAM: DG HIP (WITH OR WITHOUT PELVIS) 2-3V RIGHT COMPARISON:  CT 11/05/2019 FINDINGS: There is no evidence of hip fracture or dislocation. There is no evidence of arthropathy or other focal bone abnormality. IMPRESSION: Negative. Electronically Signed   By: JONETTA Faes M.D.   On: 03/13/2023 09:48   DG Shoulder Right Result Date: 03/13/2023 CLINICAL DATA:  Pain post fall EXAM: RIGHT SHOULDER - 2+ VIEW COMPARISON:  None Available. FINDINGS: There is no evidence of fracture or dislocation. There is no evidence of arthropathy or other focal bone abnormality. Soft tissues are unremarkable. IMPRESSION: Negative. Electronically Signed   By: JONETTA Faes M.D.   On: 03/13/2023 09:46    Procedures Procedures    Medications Ordered in ED Medications  ibuprofen  (ADVIL ) tablet 800 mg (800 mg Oral Given 03/12/23 2311)    ED Course/ Medical Decision Making/ A&P Clinical Course as of 03/13/23 0958  Wed Mar 13, 2023  0955 Cervical spine x-Emmajean Ratledge reviewed interpreted no evidence abnormality noted [DR]  0955 Right hip x-Shainna Faux reviewed interpreted numbness of acute abnormality noted and radiologist interpretation concurs [DR]  0956 Right shoulder x-Jeanluc Wegman reviewed and interpreted and no evidence of acute abnormality noted and radiologist interpretation concurs [DR]    Clinical Course User Index [DR] Levander Houston, MD                                 Medical Decision Making Amount and/or Complexity of Data Reviewed Radiology: ordered.  Risk Prescription drug management.   28 year old female with mechanical fall last night.  Presents complaining of neck, shoulder, hip pain.  Patient examined and no bony point tenderness noted.   Patient is neurologically awake and alert and has not had any change in her level of consciousness or severity of headache.  Doubt acute intracranial abnormality noted.  Cervical spine, right shoulder, and right hip x-rays were obtained and no evidence of acute abnormality is noted. Plan conservative therapy with hot and cold therapy, ibuprofen ,        Final Clinical Impression(s) / ED Diagnoses Final diagnoses:  Fall, initial encounter  Contusion of right shoulder, initial encounter    Rx / DC Orders ED Discharge  Orders     None         Levander Houston, MD 03/13/23 1000

## 2023-03-13 NOTE — ED Notes (Signed)
 Patient transported to CT

## 2023-04-17 ENCOUNTER — Inpatient Hospital Stay (HOSPITAL_COMMUNITY)
Admission: AD | Admit: 2023-04-17 | Discharge: 2023-04-17 | Disposition: A | Discharge status: Home / Self Care | Payer: Medicaid Other | Attending: Obstetrics and Gynecology | Admitting: Obstetrics and Gynecology

## 2023-04-17 ENCOUNTER — Encounter (HOSPITAL_COMMUNITY): Payer: Self-pay | Admitting: *Deleted

## 2023-04-17 ENCOUNTER — Inpatient Hospital Stay (HOSPITAL_COMMUNITY): Payer: Medicaid Other

## 2023-04-17 DIAGNOSIS — N939 Abnormal uterine and vaginal bleeding, unspecified: Secondary | ICD-10-CM | POA: Diagnosis present

## 2023-04-17 DIAGNOSIS — Z789 Other specified health status: Secondary | ICD-10-CM

## 2023-04-17 HISTORY — DX: Unspecified asthma, uncomplicated: J45.909

## 2023-04-17 LAB — CBC
HCT: 37.4 % (ref 36.0–46.0)
Hemoglobin: 12 g/dL (ref 12.0–15.0)
MCH: 23.9 pg — ABNORMAL LOW (ref 26.0–34.0)
MCHC: 32.1 g/dL (ref 30.0–36.0)
MCV: 74.4 fL — ABNORMAL LOW (ref 80.0–100.0)
Platelets: 452 10*3/uL — ABNORMAL HIGH (ref 150–400)
RBC: 5.03 MIL/uL (ref 3.87–5.11)
RDW: 16.3 % — ABNORMAL HIGH (ref 11.5–15.5)
WBC: 5.2 10*3/uL (ref 4.0–10.5)
nRBC: 0 % (ref 0.0–0.2)

## 2023-04-17 LAB — URINALYSIS, ROUTINE W REFLEX MICROSCOPIC
Bilirubin Urine: NEGATIVE
Glucose, UA: NEGATIVE mg/dL
Ketones, ur: NEGATIVE mg/dL
Nitrite: NEGATIVE
Protein, ur: NEGATIVE mg/dL
RBC / HPF: >50 RBC/hpf (ref 0–5)
Specific Gravity, Urine: 1.017 (ref 1.005–1.030)
pH: 6 (ref 5.0–8.0)

## 2023-04-17 LAB — COMPREHENSIVE METABOLIC PANEL
ALT: 15 U/L (ref 0–44)
AST: 18 U/L (ref 15–41)
Albumin: 3.8 g/dL (ref 3.5–5.0)
Alkaline Phosphatase: 58 U/L (ref 38–126)
Anion gap: 10 (ref 5–15)
BUN: 6 mg/dL (ref 6–20)
CO2: 26 mmol/L (ref 22–32)
Calcium: 9.5 mg/dL (ref 8.9–10.3)
Chloride: 100 mmol/L (ref 98–111)
Creatinine, Ser: 0.67 mg/dL (ref 0.44–1.00)
GFR, Estimated: >60 mL/min (ref >60)
Glucose, Bld: 98 mg/dL (ref 70–99)
Potassium: 4.1 mmol/L (ref 3.5–5.1)
Sodium: 136 mmol/L (ref 135–145)
Total Bilirubin: 0.3 mg/dL (ref 0.0–1.2)
Total Protein: 8 g/dL (ref 6.5–8.1)

## 2023-04-17 LAB — WET PREP, GENITAL
Clue Cells Wet Prep HPF POC: NONE SEEN
Sperm: NONE SEEN
Trich, Wet Prep: NONE SEEN
WBC, Wet Prep HPF POC: <10 (ref <10)
Yeast Wet Prep HPF POC: NONE SEEN

## 2023-04-17 LAB — HCG, QUANTITATIVE, PREGNANCY: hCG, Beta Chain, Quant, S: <1 m[IU]/mL (ref <5)

## 2023-04-17 MED ORDER — IBUPROFEN 800 MG PO TABS: 800.0000 mg | ORAL_TABLET | Freq: Three times a day (TID) | ORAL | 0 refills | Status: DC

## 2023-04-17 MED ORDER — TRANEXAMIC ACID 650 MG PO TABS: 1300.0000 mg | ORAL_TABLET | Freq: Three times a day (TID) | ORAL | 0 refills | Status: AC

## 2023-04-17 NOTE — OB Triage Provider Note (Addendum)
 None     S [name] is a 28 y.o. [phone] non-pregnant female who presents to MAU today with complaint of vaginal bleeding. LMP 03/19/23 and lasted until 03/24/23. Reports sexual activity several times in the week following with established partner. Reports cycle is usually 40 days. Reports she started bleeding 04/15/23 and the amount was heavy but without clots. Having to change a pad or tampon several times an hour, but are not saturated.   Receives care at [facility]. Records reviewed.  Pertinent items noted in HPI and remainder of comprehensive ROS otherwise negative.   O BP 131/82 (BP Location: Right Arm)   Pulse 80   Temp 98.7 F (37.1 C) (Oral)   Resp 18   Ht 5' 3 (1.6 m)   Wt 127.9 kg   LMP 03/19/2023   SpO2 100%   BMI 49.95 kg/m  Physical Exam Vitals reviewed.  Constitutional:      General: She is not in acute distress.    Appearance: Normal appearance. She is not ill-appearing, toxic-appearing or diaphoretic.  HENT:     Head: Normocephalic.  Cardiovascular:     Rate and Rhythm: Normal rate.     Pulses: Normal pulses.     Heart sounds: Normal heart sounds.  Pulmonary:     Effort: Pulmonary effort is normal.  Skin:    General: Skin is warm and dry.     Capillary Refill: Capillary refill takes less than 2 seconds.  Neurological:     Mental Status: She is alert and oriented to person, place, and time.  Psychiatric:        Mood and Affect: Mood normal.        Behavior: Behavior normal.        Thought Content: Thought content normal.        Judgment: Judgment normal.      MDM: Reviewed records HPI CUBA workup Not pregnant  MAU Course:  A Vaginal bleeding - Plan: Discharge patient - Moderate bleeding on exam.   Not currently pregnant - Plan: Discharge patient - Negative beta HcG  Abnormal uterine bleeding (AUB)  - Patient reports history of abnormal cycles, usually 40 days.   Medical screening exam complete  P - Ibuprofen  and TXA sent for  home management to patient preferred pharmacy. - Message sent to [facility] for follow up of AUB in office.   Discharge from MAU in stable condition with routine precautions Follow up at [facility] for gyn care.     Camie Rote, MSN, CNM 04/17/2023 11:50 AM  Certified Nurse Midwife, [facility]

## 2023-04-17 NOTE — MAU Note (Signed)
 Bonnie Cobb is a 28 y.o. at Unknown here in MAU reporting: +HPT last wk. Monday, she started bleeding on Monday, reports having to change every 10/15 min. - can only use tampons.  Passing quarter sized clots. Doesn't understand what is going on. Cramping.  LMP: 1/7 Onset of complaint: Monday Pain score: moderate Vitals:   04/17/23 0756  BP: 131/82  Pulse: 80  Resp: 18  Temp: 98.7 F (37.1 C)  SpO2: 100%      Lab orders placed from triage:  UA, UPT, vag swabs

## 2023-04-18 ENCOUNTER — Telehealth (HOSPITAL_COMMUNITY): Payer: Self-pay

## 2023-04-18 LAB — GC/CHLAMYDIA PROBE AMP (~~LOC~~) NOT AT ARMC
Chlamydia: POSITIVE — AB
Comment: NEGATIVE
Comment: NORMAL
Neisseria Gonorrhea: NEGATIVE

## 2023-04-18 MED ORDER — DOXYCYCLINE HYCLATE 100 MG PO CAPS: 100.0000 mg | ORAL_CAPSULE | Freq: Two times a day (BID) | ORAL | 0 refills | Status: AC

## 2023-04-29 ENCOUNTER — Encounter (HOSPITAL_BASED_OUTPATIENT_CLINIC_OR_DEPARTMENT_OTHER): Payer: Self-pay

## 2023-04-30 ENCOUNTER — Ambulatory Visit (HOSPITAL_BASED_OUTPATIENT_CLINIC_OR_DEPARTMENT_OTHER): Payer: Medicaid Other | Admitting: Family Medicine

## 2023-05-01 ENCOUNTER — Encounter: Payer: Medicaid Other | Admitting: Family Medicine

## 2023-06-04 ENCOUNTER — Encounter (HOSPITAL_BASED_OUTPATIENT_CLINIC_OR_DEPARTMENT_OTHER): Payer: Self-pay | Admitting: *Deleted

## 2023-06-13 ENCOUNTER — Ambulatory Visit (HOSPITAL_BASED_OUTPATIENT_CLINIC_OR_DEPARTMENT_OTHER): Payer: Medicaid Other | Admitting: Family Medicine

## 2023-07-17 DIAGNOSIS — J4520 Mild intermittent asthma, uncomplicated: Secondary | ICD-10-CM | POA: Insufficient documentation

## 2023-08-12 ENCOUNTER — Ambulatory Visit (HOSPITAL_BASED_OUTPATIENT_CLINIC_OR_DEPARTMENT_OTHER): Admitting: Family Medicine

## 2023-11-12 ENCOUNTER — Inpatient Hospital Stay (HOSPITAL_COMMUNITY)
Admission: AD | Admit: 2023-11-12 | Discharge: 2023-11-12 | Disposition: A | Discharge status: Home / Self Care | Attending: Obstetrics and Gynecology | Admitting: Obstetrics and Gynecology

## 2023-11-12 ENCOUNTER — Other Ambulatory Visit: Payer: Self-pay

## 2023-11-12 ENCOUNTER — Encounter (HOSPITAL_COMMUNITY): Payer: Self-pay | Admitting: Obstetrics and Gynecology

## 2023-11-12 ENCOUNTER — Inpatient Hospital Stay (HOSPITAL_COMMUNITY)

## 2023-11-12 DIAGNOSIS — O418X10 Other specified disorders of amniotic fluid and membranes, first trimester, not applicable or unspecified: Secondary | ICD-10-CM

## 2023-11-12 DIAGNOSIS — Z3A01 Less than 8 weeks gestation of pregnancy: Secondary | ICD-10-CM | POA: Diagnosis not present

## 2023-11-12 DIAGNOSIS — O219 Vomiting of pregnancy, unspecified: Secondary | ICD-10-CM

## 2023-11-12 DIAGNOSIS — O200 Threatened abortion: Secondary | ICD-10-CM

## 2023-11-12 DIAGNOSIS — Z3490 Encounter for supervision of normal pregnancy, unspecified, unspecified trimester: Secondary | ICD-10-CM

## 2023-11-12 DIAGNOSIS — O039 Complete or unspecified spontaneous abortion without complication: Secondary | ICD-10-CM | POA: Diagnosis present

## 2023-11-12 DIAGNOSIS — Z3491 Encounter for supervision of normal pregnancy, unspecified, first trimester: Secondary | ICD-10-CM | POA: Diagnosis not present

## 2023-11-12 LAB — URINALYSIS, ROUTINE W REFLEX MICROSCOPIC
Bacteria, UA: NONE SEEN
Bilirubin Urine: NEGATIVE
Glucose, UA: NEGATIVE mg/dL
Hgb urine dipstick: NEGATIVE
Ketones, ur: NEGATIVE mg/dL
Nitrite: NEGATIVE
Protein, ur: NEGATIVE mg/dL
Specific Gravity, Urine: 1.02 (ref 1.005–1.030)
pH: 6 (ref 5.0–8.0)

## 2023-11-12 LAB — CBC
HCT: 35.8 % — ABNORMAL LOW (ref 36.0–46.0)
Hemoglobin: 11.5 g/dL — ABNORMAL LOW (ref 12.0–15.0)
MCH: 23.8 pg — ABNORMAL LOW (ref 26.0–34.0)
MCHC: 32.1 g/dL (ref 30.0–36.0)
MCV: 74.1 fL — ABNORMAL LOW (ref 80.0–100.0)
Platelets: 419 K/uL — ABNORMAL HIGH (ref 150–400)
RBC: 4.83 MIL/uL (ref 3.87–5.11)
RDW: 16.1 % — ABNORMAL HIGH (ref 11.5–15.5)
WBC: 9 K/uL (ref 4.0–10.5)
nRBC: 0 % (ref 0.0–0.2)

## 2023-11-12 LAB — WET PREP, GENITAL
Sperm: NONE SEEN
Trich, Wet Prep: NONE SEEN
WBC, Wet Prep HPF POC: <10 (ref <10)
Yeast Wet Prep HPF POC: NONE SEEN

## 2023-11-12 LAB — HCG, QUANTITATIVE, PREGNANCY: hCG, Beta Chain, Quant, S: 118157 m[IU]/mL — ABNORMAL HIGH (ref <5)

## 2023-11-12 LAB — HIV ANTIBODY (ROUTINE TESTING W REFLEX): HIV Screen 4th Generation wRfx: NONREACTIVE

## 2023-11-12 LAB — POCT PREGNANCY, URINE: Preg Test, Ur: POSITIVE — AB

## 2023-11-12 MED ORDER — PREPLUS 27-1 MG PO TABS
1.0000 | ORAL_TABLET | Freq: Every day | ORAL | 13 refills | Status: DC
Start: 2023-11-12 — End: 2023-12-18

## 2023-11-12 MED ORDER — ONDANSETRON 8 MG PO TBDP: 8.0000 mg | ORAL_TABLET | Freq: Three times a day (TID) | ORAL | 0 refills | Status: DC | PRN

## 2023-11-12 MED ORDER — PROMETHAZINE HCL 25 MG PO TABS
25.0000 mg | ORAL_TABLET | Freq: Four times a day (QID) | ORAL | 0 refills | Status: AC | PRN
Start: 2023-11-12 — End: ?

## 2023-11-12 NOTE — Discharge Instructions (Addendum)
 Return to MAU: If you have heavier bleeding that soaks through more that 2 pads per hour for an hour or more If you bleed so much that you feel like you might pass out or you do pass out If you have significant abdominal pain that is not improved with Tylenol 1000 mg every 8 hours as needed for pain If you develop a fever > 100.5    [employer] for [employer] at [employer] [facility]             [address] [place], [place] [address] [phone]  Center for [employer] at [facility]                                                             [address], [place], [place], [address] [phone]  Center for [facility] at [facility] [address], [place], [place], [address] [phone]  Center for [facility] at [facility] [address], [place], [place], [address] [phone]  Center for [facility] at [facility]                                 [address] [place], [place], [place], [address] [phone]  Center for [facility] at [facility]                                    [address], [place], [place], [address] [phone]  Center for [facility] at [facility] [address], [address], [place], [place], [address]                              [facility] [place] [address], [place], [place], [address] [phone]  [facility]         Phone: [phone]  [facility]      Phone: [phone]   [facility]      Phone: [phone]  [facility]-Family [facility]         Phone: [phone]   [facility] Department-Maternity    Phone: [phone]  [facility]      Phone: [phone]  [facility] [place]     Phone: [phone]  [facility]        Phone: [phone]  [facility] ([address] [place]) [phone]  [facility]      Phone: [phone]

## 2023-11-12 NOTE — MAU Provider Note (Signed)
 History     CSN: [id number]  Arrival date and time: 11/12/23 1343   Event Date/Time   First Provider Initiated Contact with Patient 11/12/23 1748      Chief Complaint  Patient presents with   Possible Pregnancy   Abdominal Pain   HPI [name] is a 28 y.o. year old [id number] female at [redacted]w[redacted]d weeks gestation who presents to MAU reporting (+) HPT x 2 on Saturday 11/09/2023. She is unsure of her LMP. She denies VB. She reports upper abdominal pain. She describes the pain as stabbing and intermittent. She reports bad indigestion, N/V last night and belching a lot. She has not been able to keep anything down, but apple juice. She has not taken any medication.    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 No MFM consult needed. Just needs Type and Crossmatch in labor (in case of PPH etc).   Maternal gonorrhea in second trimester 04/02/2019   NSVD (normal spontaneous vaginal delivery)    PTSD (post-traumatic stress disorder)     Past Surgical History:  Procedure Laterality Date   NO PAST SURGERIES      Family History  Problem Relation Age of Onset   Asthma Mother    Asthma Father     Social History   Tobacco Use   Smoking status: Former    Types: Cigars   Smokeless tobacco: Never   Tobacco comments:    Rare vape  Vaping Use   Vaping status: Some Days  Substance Use Topics   Alcohol use: No   Drug use: Yes    Types: Marijuana     Comment: once a month    Allergies:  Allergies  Allergen Reactions   Bee Venom Anaphylaxis   Other     Carmex lip balm, mushrooms   Pineapple Swelling    Tongue Swelling, fresh pineapple only   Poison Ivy Extract Other (See Comments)    Medications Prior to Admission  Medication Sig Dispense Refill Last Dose/Taking   albuterol  (VENTOLIN  HFA) 108 (90 Base) MCG/ACT inhaler Inhale 1-2 puffs into the lungs every 6 (six) hours as needed for wheezing or shortness of breath. 18 g 1    buPROPion (ZYBAN) 150 MG 12 hr tablet Take 150 mg by mouth daily.      famotidine  (PEPCID ) 20 MG tablet Take 1 tablet (20 mg total) by mouth 2 (two) times daily as needed for heartburn or indigestion. 15 tablet 0    Ferric Maltol  (ACCRUFER ) 30 MG CAPS Take 1 capsule (30 mg total) by mouth in the morning and at bedtime. 180 capsule 1    guanFACINE (TENEX) 2 MG tablet Take 2 mg by mouth at bedtime.  hydrOXYzine (ATARAX) 10 MG tablet Take 10 mg by mouth 2 (two) times daily as needed for anxiety.      ibuprofen  (ADVIL ) 800 MG tablet Take 1 tablet (800 mg total) by mouth 3 (three) times daily. 21 tablet 0    mupirocin  cream (BACTROBAN ) 2 % Apply 1 Application topically daily. 15 g 0    naproxen  (NAPROSYN ) 500 MG tablet Take 1 tablet (500 mg total) by mouth 2 (two) times daily as needed (tooth pain). 14 tablet 0    senna-docusate (SENOKOT-S) 8.6-50 MG tablet Take 1 tablet by mouth at bedtime as needed for mild constipation. 20 tablet 0    traZODone (DESYREL) 50 MG tablet Take 50 mg by mouth at bedtime.       Review of Systems  Constitutional:  Positive for appetite change (not able to keep anything down).  HENT: Negative.    Eyes: Negative.   Respiratory: Negative.    Cardiovascular: Negative.   Gastrointestinal:  Positive for abdominal pain (upper, stabbing and coming and going), nausea and vomiting.       Lots of belching  Endocrine: Negative.   Genitourinary: Negative.   Musculoskeletal: Negative.    Skin: Negative.   Allergic/Immunologic: Negative.   Neurological: Negative.   Hematological: Negative.   Psychiatric/Behavioral: Negative.     Physical Exam   Blood pressure 114/65, pulse 79, temperature 98.5 F (36.9 C), temperature source Oral, resp. rate 19, height 5' 3 (1.6 m), weight 130.2 kg, SpO2 100%.  Physical Exam Vitals and nursing note reviewed.  Constitutional:      Appearance: Normal appearance. She is obese.  Cardiovascular:     Rate and Rhythm: Normal rate.  Pulmonary:     Effort: Pulmonary effort is normal.  Genitourinary:    Comments: Swabs collected by blind using blind swab technique  Musculoskeletal:        General: Normal range of motion.  Neurological:     Mental Status: She is alert and oriented to person, place, and time.  Psychiatric:        Mood and Affect: Mood normal.        Behavior: Behavior normal.        Thought Content: Thought content normal.        Judgment: Judgment normal.     MAU Course  Procedures  MDM CCUA UPT CBC ABO/Rh -- not drawn; known A POS HCG Wet Prep GC/CT -- Results pending  HIV RPR -- Results pending  OB U/S < 14 wks TVUS  Results for orders placed or performed during the hospital encounter of 11/12/23 (from the past 24 hours)  Urinalysis, Routine w reflex microscopic -Urine, Clean Catch     Status: Abnormal   Collection Time: 11/12/23  2:44 PM  Result Value Ref Range   Color, Urine YELLOW YELLOW   APPearance HAZY (A) CLEAR   Specific Gravity, Urine 1.020 1.005 - 1.030   pH 6.0 5.0 - 8.0   Glucose, UA NEGATIVE NEGATIVE mg/dL   Hgb urine dipstick NEGATIVE NEGATIVE   Bilirubin Urine NEGATIVE NEGATIVE   Ketones, ur NEGATIVE NEGATIVE mg/dL   Protein, ur NEGATIVE NEGATIVE mg/dL   Nitrite NEGATIVE NEGATIVE   Leukocytes,Ua SMALL (A) NEGATIVE   RBC / HPF 0-5 0 - 5 RBC/hpf   WBC, UA 0-5 0 - 5 WBC/hpf   Bacteria, UA NONE SEEN NONE SEEN   Squamous Epithelial / HPF 0-5 0 - 5 /HPF   Mucus PRESENT    Pregnancy, urine POC  Status: Abnormal   Collection Time: 11/12/23  2:44 PM  Result Value Ref Range   Preg Test, Ur POSITIVE (A) NEGATIVE  CBC     Status: Abnormal   Collection Time: 11/12/23  3:26 PM  Result Value Ref Range   WBC 9.0 4.0 - 10.5 K/uL   RBC 4.83 3.87 - 5.11 MIL/uL   Hemoglobin 11.5 (L) 12.0 - 15.0 g/dL   HCT 64.1 (L) 63.9 - 53.9 %   MCV 74.1 (L) 80.0 - 100.0 fL   MCH 23.8 (L) 26.0 - 34.0 pg   MCHC 32.1 30.0 - 36.0 g/dL   RDW 83.8 (H) 88.4 - 84.4 %   Platelets 419 (H) 150 - 400 K/uL   nRBC 0.0 0.0 - 0.2 %  hCG, quantitative, pregnancy     Status: Abnormal   Collection Time: 11/12/23  3:26 PM  Result Value Ref Range   hCG, Beta Chain, Quant, S 118,157 (H) <5 mIU/mL  HIV Antibody (routine testing w rflx)     Status: None   Collection Time: 11/12/23  3:26 PM  Result Value Ref Range   HIV Screen 4th Generation wRfx Non Reactive Non Reactive  Wet prep, genital     Status: Abnormal   Collection Time: 11/12/23  4:00 PM  Result Value Ref Range   Yeast Wet Prep HPF POC NONE SEEN NONE SEEN   Trich, Wet Prep NONE SEEN NONE SEEN   Clue Cells Wet Prep HPF POC PRESENT (A) NONE SEEN   WBC, Wet Prep HPF POC <10 <10   Sperm NONE SEEN     US  OB LESS THAN 14 WEEKS WITH OB TRANSVAGINAL Result Date: 11/12/2023 CLINICAL DATA:  Abdominal pain. EXAM: OBSTETRIC <14 WK ULTRASOUND TECHNIQUE: Transabdominal ultrasound was performed for evaluation of the gestation as well as the maternal uterus and adnexal regions. COMPARISON:  None Available. FINDINGS: Intrauterine gestational sac: Single intrauterine gestational sac. Yolk sac:  Seen Embryo:  Present Cardiac Activity: Detected Heart Rate: 148 bpm CRL:   13 mm   7 w 4 d                  US  EDC: 06/26/2024 Subchorionic hemorrhage: Small subchorionic hemorrhage measures up to 1.5 cm. Maternal uterus/adnexae: The right ovary is unremarkable. The corpus luteum noted in the right ovary. The left ovary is not visualized. IMPRESSION: Single live  intrauterine pregnancy with an estimated gestational age of [redacted] weeks, 4 days by ultrasound. Electronically Signed   By: Vanetta Chou M.D.   On: 11/12/2023 17:29    Assessment and Plan  1. Intrauterine pregnancy (Primary) - Start [facility] - Information provided on local OB Providers   2. Nausea/vomiting in pregnancy - Information provided on morning sickness - Prescription for: Phenergan  25 mg po or pv every 6 hr prn N/V - Prescription for: Zofran  8 mg ODT every 8 hrs, if N/V isn't relieved by Phenergan    3. Subchorionic hematoma in first trimester, single or unspecified fetus - Information provided on [facility] - Advised will not need any f/u U/S unless she has increased VB and/or pain unrelieved by Tylenol  - Anticipatory guidance for next U/S possibly being anatomy between 18-20 weeks. - Return to MAU: If you have heavier bleeding that soaks through more that 2 pads per hour for an hour or more If you bleed so much that you feel like you might pass out or you do pass out If you have significant abdominal pain that is not improved with Tylenol  1000 mg  every 8 hours as needed for pain If you develop a fever > 100.5    4. Threatened miscarriage in early pregnancy - Information provided on SAB   5. [redacted] weeks gestation of pregnancy   - Discharge home  - Start [facility] - Patient verbalized an understanding of the plan of care and agrees.     Ala Cart, CNM 11/12/2023, 5:49 PM

## 2023-11-12 NOTE — MAU Note (Signed)
 Bonnie Cobb is a 28 y.o. at Unknown here in MAU reporting: she's had 2 +HPT and wants to know how far pregnant she is.  Denies VB and pain.  Reports had upper abdominal pain when in lobby, states pain was stabbing and intermittent.  States does have bad indigestion, belches a lot. States had N/V last night, was only able to apple juice down.  LMP: Unsure Onset of complaint: today Pain score: 0 Vitals:   11/12/23 1415  BP: 114/65  Pulse: 79  Resp: 19  Temp: 98.5 F (36.9 C)  SpO2: 100%     FHT: NA  Lab orders placed from triage: UPT

## 2023-11-13 LAB — GC/CHLAMYDIA PROBE AMP (~~LOC~~) NOT AT ARMC
Chlamydia: NEGATIVE
Comment: NEGATIVE
Comment: NORMAL
Neisseria Gonorrhea: NEGATIVE

## 2023-11-13 LAB — RPR: RPR Ser Ql: NONREACTIVE

## 2023-11-20 ENCOUNTER — Other Ambulatory Visit (INDEPENDENT_AMBULATORY_CARE_PROVIDER_SITE_OTHER): Payer: Self-pay

## 2023-11-20 ENCOUNTER — Ambulatory Visit (INDEPENDENT_AMBULATORY_CARE_PROVIDER_SITE_OTHER): Admitting: *Deleted

## 2023-11-20 VITALS — BP 137/87 | HR 89 | Wt 289.0 lb

## 2023-11-20 DIAGNOSIS — Z3480 Encounter for supervision of other normal pregnancy, unspecified trimester: Secondary | ICD-10-CM | POA: Insufficient documentation

## 2023-11-20 DIAGNOSIS — Z3481 Encounter for supervision of other normal pregnancy, first trimester: Secondary | ICD-10-CM

## 2023-11-20 DIAGNOSIS — Z3A08 8 weeks gestation of pregnancy: Secondary | ICD-10-CM

## 2023-11-20 DIAGNOSIS — O219 Vomiting of pregnancy, unspecified: Secondary | ICD-10-CM

## 2023-11-20 MED ORDER — BLOOD PRESSURE KIT DEVI: 1.0000 | 0 refills | Status: AC

## 2023-11-20 MED ORDER — DOXYLAMINE-PYRIDOXINE 10-10 MG PO TBEC: 2.0000 | DELAYED_RELEASE_TABLET | Freq: Every day | ORAL | 5 refills | Status: AC

## 2023-11-20 MED ORDER — FAMOTIDINE 20 MG PO TABS: 20.0000 mg | ORAL_TABLET | Freq: Two times a day (BID) | ORAL | 3 refills | Status: AC

## 2023-11-20 NOTE — Patient Instructions (Signed)

## 2023-11-20 NOTE — Progress Notes (Signed)
 New OB Intake  I connected with Rito ONEIDA Durand  on 11/20/23 at  2:10 PM EDT by In person Visit and verified that I am speaking with the correct person using two identifiers. Nurse is located at [facility] and pt is located at [place].  I discussed the limitations, risks, security and privacy concerns of performing an evaluation and management service by telephone and the availability of in person appointments. I also discussed with the patient that there may be a patient responsible charge related to this service. The patient expressed understanding and agreed to proceed.  I explained I am completing New OB Intake today. We discussed EDD of 06/26/24 based on US  at [redacted]w[redacted]d weeks. Pt is G5P1031. I reviewed her allergies, medications and Medical/Surgical/OB history.    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 Discussed the nature of our practice with multiple providers including residents and students as well as female and female providers. Due to the size of the practice, the delivering provider may not be the same as those providing prenatal care.   Patient is interested in water birth.  MyChart/Babyscripts MyChart access verified. I explained pt will have some visits in office and some virtually. Babyscripts instructions given and order placed. Patient verifies receipt of registration text/e-mail. Account successfully  created and app downloaded. If patient is a candidate for Optimized scheduling, add to sticky note.   Blood Pressure Cuff/Weight Scale Blood pressure cuff ordered for patient to pick-up from [employer]. Explained after first prenatal appt pt will check weekly and document in Babyscripts. Patient does not have weight scale; patient may purchase if they desire to track weight weekly in Babyscripts.  Anatomy US  Explained first scheduled US  will be around 19 weeks. Anatomy US  scheduled for TBD at TBD.  Is patient a candidate for Babyscripts Optimization? No, due to Risk Factors   First visit review I reviewed new OB appt with patient. Explained pt will be seen by Nidia Daring, NP at first visit. Discussed [place] genetic screening with patient. Requests Panorama and Horizon.. Routine prenatal labs OB Urine collected at today's visit. Initial OB labs deferred to New OB appt.   Last Pap Diagnosis  Date Value Ref Range Status  07/09/2019   Final   - Negative for intraepithelial lesion or malignancy (NILM)    Rocky CHRISTELLA Ober, RN 11/20/2023  2:27 PM

## 2023-11-22 ENCOUNTER — Ambulatory Visit: Payer: Self-pay | Admitting: Obstetrics & Gynecology

## 2023-11-22 LAB — CULTURE, OB URINE

## 2023-11-22 LAB — URINE CULTURE, OB REFLEX

## 2023-12-18 ENCOUNTER — Ambulatory Visit (INDEPENDENT_AMBULATORY_CARE_PROVIDER_SITE_OTHER): Admitting: Obstetrics and Gynecology

## 2023-12-18 ENCOUNTER — Encounter: Payer: Self-pay | Admitting: Obstetrics and Gynecology

## 2023-12-18 ENCOUNTER — Other Ambulatory Visit (HOSPITAL_COMMUNITY)
Admission: RE | Admit: 2023-12-18 | Discharge: 2023-12-18 | Disposition: A | Discharge status: Home / Self Care | Source: Ambulatory Visit | Attending: Obstetrics and Gynecology | Admitting: Obstetrics and Gynecology

## 2023-12-18 VITALS — BP 124/85 | HR 92 | Wt 287.8 lb

## 2023-12-18 DIAGNOSIS — Z3481 Encounter for supervision of other normal pregnancy, first trimester: Secondary | ICD-10-CM

## 2023-12-18 DIAGNOSIS — N898 Other specified noninflammatory disorders of vagina: Secondary | ICD-10-CM

## 2023-12-18 DIAGNOSIS — Z3A12 12 weeks gestation of pregnancy: Secondary | ICD-10-CM | POA: Insufficient documentation

## 2023-12-18 DIAGNOSIS — Z31430 Encounter of female for testing for genetic disease carrier status for procreative management: Secondary | ICD-10-CM

## 2023-12-18 DIAGNOSIS — O26891 Other specified pregnancy related conditions, first trimester: Secondary | ICD-10-CM | POA: Diagnosis present

## 2023-12-18 DIAGNOSIS — Z113 Encounter for screening for infections with a predominantly sexual mode of transmission: Secondary | ICD-10-CM

## 2023-12-18 MED ORDER — VITAFOL GUMMIES 3.33-0.333-34.8 MG PO CHEW: 3.0000 | CHEWABLE_TABLET | Freq: Every day | ORAL | 5 refills | Status: DC

## 2023-12-18 MED ORDER — ASPIRIN 81 MG PO TBEC: 81.0000 mg | DELAYED_RELEASE_TABLET | Freq: Every day | ORAL | 2 refills | Status: DC

## 2023-12-18 NOTE — Progress Notes (Signed)
 Pink on tissue when she wiped yesterday. Also experienced sharp lower pelvis pain yesterday following bathroom/tissue incident.   Does not like current prenatal vitamins. Hard to swallow.   Pt states having a slight odor.

## 2023-12-18 NOTE — Patient Instructions (Addendum)
   Considering Waterbirth? Guide for patients at [facility]) Why consider waterbirth? Gentle birth for babies  Less pain medicine used in labor  May allow for passive descent/less pushing  May reduce perineal tears  More mobility and instinctive maternal position changes  Increased maternal relaxation   Is waterbirth safe? What are the risks of infection, drowning or other complications? Infection:  Very low risk (3.7 % for tub vs 4.8% for bed)  7 in 8000 waterbirths with documented infection  Poorly cleaned equipment most common cause  Slightly lower group B strep transmission rate  Drowning  Maternal:  Very low risk  Related to seizures or fainting  Newborn:  Very low risk. No evidence of increased risk of respiratory problems in multiple large studies  Physiological protection from breathing under water  Avoid underwater birth if there are any fetal complications  Once baby's head is out of the water, keep it out.  Birth complication  Some reports of cord trauma, but risk decreased by bringing baby to surface gradually  No evidence of increased risk of shoulder dystocia. Mothers can usually change positions faster in water than in a bed, possibly aiding the maneuvers to free the shoulder.   There are 2 things you MUST do to have a waterbirth with [facility]: Attend a waterbirth class at [facility] at [facility]   3rd Wednesday of every month from 7-9 pm (virtual during COVID) [employer] at [email or web address] or [email or web address] or by calling [phone] Bring us  the certificate from the class to your prenatal appointment or send via MyChart Meet with a midwife at 36 weeks* to see if you can still plan a waterbirth and to sign the consent.   *We also recommend that you schedule as many of your prenatal visits with a midwife as possible.    Helpful information: You may want to bring a bathing suit top to the hospital  to wear during labor but this is optional.  All other supplies are provided by the hospital. Please arrive at the hospital with signs of active labor, and do not wait at home until late in labor. It takes 45 min- 1 hour for fetal monitoring, and check in to your room to take place, plus transport and filling of the waterbirth tub.    Things that would prevent you from having a waterbirth: Premature, <37wks  Previous cesarean birth  Presence of thick meconium-stained fluid  Multiple gestation (Twins, triplets, etc.)  Uncontrolled diabetes or gestational diabetes requiring medication  Hypertension diagnosed in pregnancy or preexisting hypertension (gestational hypertension, preeclampsia, or chronic hypertension) Fetal growth restriction (your baby measures less than 10th percentile on ultrasound) Heavy vaginal bleeding  Non-reassuring fetal heart rate  Active infection (MRSA, etc.). Group B Strep is NOT a contraindication for waterbirth.  If your labor has to be induced and induction method requires continuous monitoring of the baby's heart rate  Other risks/issues identified by your obstetrical provider   Please remember that birth is unpredictable. Under certain unforeseeable circumstances your provider may advise against giving birth in the tub. These decisions will be made on a case-by-case basis and with the safety of you and your baby as our highest priority.    Updated 06/14/21

## 2023-12-18 NOTE — Progress Notes (Signed)
 INITIAL PRENATAL VISIT  Subjective:   Bonnie Cobb is being seen today for her first obstetrical visit.   She is at [redacted]w[redacted]d gestation by ultrasound Her obstetrical history is significant for  . Patient does not intend to breast feed. Pregnancy history fully reviewed.  Patient reports reports vaginal odor   Indications for ASA therapy (per uptodate) Two or more of the following: Nulliparity No Obesity (body mass index >30 kg/m2) Yes Family history of preeclampsia in mother or sister No Age >=35 years No Sociodemographic characteristics (African American race, low socioeconomic level) Yes Personal risk factors (eg, previous pregnancy with low birth weight or small for gestational age infant, previous adverse pregnancy outcome [eg, stillbirth], interval >10 years between pregnancies) No   Objective:    Obstetric History OB History  Gravida Para Term Preterm AB Living  5 1 1  3 1   SAB IAB Ectopic Multiple Live Births  3    1    # Outcome Date GA Lbr Len/2nd Weight Sex Type Anes PTL Lv  5 Current           4 Term 08/04/19 [redacted]w[redacted]d 02:19 / 00:03 7 lb 14.8 oz (3.595 kg) M Vag-Spont None  LIV  3 SAB 06/02/18 [redacted]w[redacted]d         2 SAB           1 SAB             Past Medical History:  Diagnosis Date   ADHD (attention deficit hyperactivity disorder)    Anemia affecting pregnancy in second trimester 04/02/2019   Rx for Iron sent    Asthma    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 No MFM consult needed. Just needs Type and Crossmatch in labor (in case of PPH etc).   Maternal gonorrhea in second trimester 04/02/2019   NSVD (normal spontaneous vaginal delivery)    Prediabetes in mother during pregnancy    PTSD (post-traumatic stress disorder)     Past Surgical History:  Procedure Laterality Date   NO PAST SURGERIES       Current Outpatient Medications on File Prior to Visit  Medication Sig Dispense Refill   albuterol  (VENTOLIN  HFA) 108 (90 Base) MCG/ACT inhaler Inhale 1-2 puffs into the lungs every 6 (six) hours as needed for wheezing or shortness of breath. 18 g 1   Blood Pressure Monitoring (BLOOD PRESSURE KIT) DEVI 1 Device by Does not apply route once a week. 1 each 0   buPROPion (ZYBAN) 150 MG 12 hr tablet Take 150 mg by mouth daily.     Doxylamine -Pyridoxine  (DICLEGIS ) 10-10 MG TBEC Take 2 tablets by mouth at bedtime. If symptoms persist, add one tablet in the morning and one in the afternoon 100 tablet 5   famotidine  (PEPCID ) 20 MG tablet Take 1 tablet (20 mg total) by mouth 2 (two) times daily. 60 tablet 3   guanFACINE (TENEX) 2 MG tablet Take 2 mg by mouth at bedtime.     hydrOXYzine (ATARAX) 10 MG tablet Take 10 mg by mouth 2 (two) times daily as needed for anxiety.     promethazine  (PHENERGAN ) 25 MG tablet Take 1 tablet (25 mg total) by mouth every 6 (six) hours as needed. Can insert vaginally, if unable to keep anything down 30 tablet 0   famotidine  (PEPCID ) 20 MG tablet Take 1 tablet (20 mg  total) by mouth 2 (two) times daily as needed for heartburn or indigestion. (Patient not taking: Reported on 12/18/2023) 15 tablet 0   Ferric Maltol  (ACCRUFER ) 30 MG CAPS Take 1 capsule (30 mg total) by mouth in the morning and at bedtime. (Patient not taking: Reported on 12/18/2023) 180 capsule 1   ondansetron  (ZOFRAN -ODT) 8 MG disintegrating tablet Take 1 tablet (8 mg total) by mouth every 8 (eight) hours as needed for nausea or vomiting. Take if Phenergan  isn't working for nausea and vomiting (Patient not taking: Reported on 12/18/2023) 20 tablet 0   senna-docusate (SENOKOT-S) 8.6-50 MG tablet Take 1 tablet by mouth at bedtime as needed for mild constipation. (Patient not taking: Reported on 12/18/2023) 20 tablet 0   traZODone (DESYREL) 50 MG tablet Take 50 mg by mouth at bedtime. (Patient not taking: Reported on  12/18/2023)     No current facility-administered medications on file prior to visit.    Allergies  Allergen Reactions   Bee Venom Anaphylaxis   Mushroom    Other     Carmex lip balm, mushrooms   Pineapple Swelling    Tongue Swelling, fresh pineapple only   Poison Ivy Extract Other (See Comments)    Social History:  reports that she has quit smoking. Her smoking use included cigars. She has never used smokeless tobacco. She reports that she does not currently use drugs after having used the following drugs: Marijuana. She reports that she does not drink alcohol.  Family History  Problem Relation Age of Onset   COPD Mother    Asthma Mother    Asthma Father     The following portions of the patient's history were reviewed and updated as appropriate: allergies, current medications, past family history, past medical history, past social history, past surgical history and problem list.  Review of Systems Review of Systems  All other systems reviewed and are negative.   Physical Exam:  BP 124/85   Pulse 92   Wt 287 lb 12.8 oz (130.5 kg)   LMP  (LMP Unknown)   BMI 50.98 kg/m  CONSTITUTIONAL: Well-developed, well-nourished female in no acute distress.  HENT:  Normocephalic, atraumatic.   NECK: Normal range of motion SKIN: Skin is warm and dry. MUSCULOSKELETAL: Normal range of motion NEUROLOGIC: Alert and oriented  PSYCHIATRIC: Normal mood and affect. Normal behavior.  CARDIOVASCULAR: Normal heart rate noted RESPIRATORY: normal effort ABDOMEN: Soft PELVIC:deferred   Fetal Heart Rate (bpm): 151   Movement: Present       Assessment:    Pregnancy: G5P1031  1. Encounter for supervision of other normal pregnancy in first trimester (Primary) BP and FHR normal Discussed recommendation for ASA in pregnancy, rx sent New prenatal sent  - PANORAMA PRENATAL TEST - Prenatal Vit-Fe Phos-FA-Omega (VITAFOL  GUMMIES) 3.33-0.333-34.8 MG CHEW; Chew 3 tablets by mouth daily.  Dispense:  90 tablet; Refill: 5 - aspirin  EC 81 MG tablet; Take 1 tablet (81 mg total) by mouth daily. Start taking when you are [redacted] weeks pregnant for rest of pregnancy for prevention of preeclampsia  Dispense: 300 tablet; Refill: 2  2. [redacted] weeks gestation of pregnancy  - Cytology - PAP( [place]) - CBC/D/Plt+RPR+Rh+ABO+RubIgG... - HgB A1c - Comp Met (CMET) - Cervicovaginal ancillary only( [place]) - RPR - Hepatitis B Surface AntiGEN - Hepatitis C Antibody - HIV antibody (with reflex) - Prenatal Vit-Fe Phos-FA-Omega (VITAFOL  GUMMIES) 3.33-0.333-34.8 MG CHEW; Chew 3 tablets by mouth daily.  Dispense: 90 tablet; Refill: 5 - aspirin  EC 81 MG tablet;  Take 1 tablet (81 mg total) by mouth daily. Start taking when you are [redacted] weeks pregnant for rest of pregnancy for prevention of preeclampsia  Dispense: 300 tablet; Refill: 2  3. Screen for STD (sexually transmitted disease)  - RPR - Hepatitis B Surface AntiGEN - Hepatitis C Antibody - HIV antibody (with reflex)  4. Encounter of female for testing for genetic disease carrier status for procreative management  - HORIZON Basic Panel  5. Vaginal odor  - Cervicovaginal ancillary only( [place])    Plan:     Initial labs drawn. Prenatal vitamins. Problem list reviewed and updated. Reviewed in detail the nature of the practice with collaborative care between  Genetic screening discussed: NIPS/First trimester screen/Quad/AFP ordered. Role of ultrasound in pregnancy discussed; Anatomy US : ordered. Discussed clinic routines, schedule of care and testing, genetic screening options, involvement of students and residents under the direct supervision of APPs and doctors and presence of female providers. Pt verbalized understanding.  Return in 4 week for OB visit   Delores Nidia CROME, FNP

## 2023-12-20 LAB — CERVICOVAGINAL ANCILLARY ONLY
Bacterial Vaginitis (gardnerella): POSITIVE — AB
Candida Glabrata: POSITIVE — AB
Candida Vaginitis: NEGATIVE
Chlamydia: NEGATIVE
Comment: NEGATIVE
Comment: NEGATIVE
Comment: NEGATIVE
Comment: NEGATIVE
Comment: NEGATIVE
Comment: NORMAL
Neisseria Gonorrhea: NEGATIVE
Trichomonas: POSITIVE — AB

## 2023-12-23 ENCOUNTER — Ambulatory Visit: Payer: Self-pay | Admitting: Obstetrics and Gynecology

## 2023-12-23 DIAGNOSIS — Z3480 Encounter for supervision of other normal pregnancy, unspecified trimester: Secondary | ICD-10-CM

## 2023-12-23 DIAGNOSIS — A5901 Trichomonal vulvovaginitis: Secondary | ICD-10-CM | POA: Insufficient documentation

## 2023-12-23 DIAGNOSIS — O99810 Abnormal glucose complicating pregnancy: Secondary | ICD-10-CM | POA: Insufficient documentation

## 2023-12-23 LAB — COMPREHENSIVE METABOLIC PANEL WITH GFR
ALT: 13 IU/L (ref 0–32)
AST: 14 IU/L (ref 0–40)
Albumin: 4.1 g/dL (ref 4.0–5.0)
Alkaline Phosphatase: 58 IU/L (ref 41–116)
BUN/Creatinine Ratio: 10 (ref 9–23)
BUN: 6 mg/dL (ref 6–20)
Bilirubin Total: 0.2 mg/dL (ref 0.0–1.2)
CO2: 19 mmol/L — ABNORMAL LOW (ref 20–29)
Calcium: 9.7 mg/dL (ref 8.7–10.2)
Chloride: 101 mmol/L (ref 96–106)
Creatinine, Ser: 0.58 mg/dL (ref 0.57–1.00)
Globulin, Total: 3.7 g/dL (ref 1.5–4.5)
Glucose: 83 mg/dL (ref 70–99)
Potassium: 4.1 mmol/L (ref 3.5–5.2)
Sodium: 137 mmol/L (ref 134–144)
Total Protein: 7.8 g/dL (ref 6.0–8.5)
eGFR: 128 mL/min/1.73 (ref >59)

## 2023-12-23 LAB — CBC/D/PLT+RPR+RH+ABO+RUBIGG...
Basophils Absolute: 0 x10E3/uL (ref 0.0–0.2)
Basos: 0 %
EOS (ABSOLUTE): 0.1 x10E3/uL (ref 0.0–0.4)
Eos: 1 %
HCV Ab: NONREACTIVE
HIV Screen 4th Generation wRfx: NONREACTIVE
Hematocrit: 35.1 % (ref 34.0–46.6)
Hemoglobin: 11.1 g/dL (ref 11.1–15.9)
Hepatitis B Surface Ag: NEGATIVE
Immature Grans (Abs): 0 x10E3/uL (ref 0.0–0.1)
Immature Granulocytes: 0 %
Lymphocytes Absolute: 3.7 x10E3/uL — ABNORMAL HIGH (ref 0.7–3.1)
Lymphs: 42 %
MCH: 23.8 pg — ABNORMAL LOW (ref 26.6–33.0)
MCHC: 31.6 g/dL (ref 31.5–35.7)
MCV: 75 fL — ABNORMAL LOW (ref 79–97)
Monocytes Absolute: 0.4 x10E3/uL (ref 0.1–0.9)
Monocytes: 5 %
Neutrophils Absolute: 4.5 x10E3/uL (ref 1.4–7.0)
Neutrophils: 52 %
Platelets: 403 x10E3/uL (ref 150–450)
RBC: 4.67 x10E6/uL (ref 3.77–5.28)
RDW: 16.3 % — ABNORMAL HIGH (ref 11.7–15.4)
RPR Ser Ql: NONREACTIVE
Rh Factor: POSITIVE
Rubella Antibodies, IGG: 4.45 {index} (ref >0.99)
WBC: 8.7 x10E3/uL (ref 3.4–10.8)

## 2023-12-23 LAB — AB SCR+ANTIBODY ID: Antibody Screen: POSITIVE — AB

## 2023-12-23 LAB — HCV INTERPRETATION

## 2023-12-23 LAB — HEMOGLOBIN A1C
Est. average glucose Bld gHb Est-mCnc: 117 mg/dL
Hgb A1c MFr Bld: 5.7 % — ABNORMAL HIGH (ref 4.8–5.6)

## 2023-12-23 LAB — HEPATITIS C ANTIBODY

## 2023-12-23 MED ORDER — METRONIDAZOLE 500 MG PO TABS: 500.0000 mg | ORAL_TABLET | Freq: Two times a day (BID) | ORAL | 0 refills | Status: AC

## 2023-12-24 ENCOUNTER — Telehealth: Payer: Self-pay

## 2023-12-24 NOTE — Telephone Encounter (Signed)
 Returned call, no answer, left vm

## 2023-12-26 LAB — CYTOLOGY - PAP: Adequacy: ABNORMAL

## 2023-12-27 LAB — PANORAMA PRENATAL TEST FULL PANEL:PANORAMA TEST PLUS 5 ADDITIONAL MICRODELETIONS: FETAL FRACTION: 3.8

## 2023-12-28 ENCOUNTER — Encounter (HOSPITAL_COMMUNITY): Payer: Self-pay | Admitting: Obstetrics & Gynecology

## 2023-12-28 ENCOUNTER — Inpatient Hospital Stay (HOSPITAL_COMMUNITY)
Admission: AD | Admit: 2023-12-28 | Discharge: 2023-12-28 | Discharge status: Against Medical Advice | Attending: Obstetrics & Gynecology | Admitting: Obstetrics & Gynecology

## 2023-12-28 ENCOUNTER — Inpatient Hospital Stay (HOSPITAL_COMMUNITY)

## 2023-12-28 DIAGNOSIS — Z3A14 14 weeks gestation of pregnancy: Secondary | ICD-10-CM

## 2023-12-28 DIAGNOSIS — O26892 Other specified pregnancy related conditions, second trimester: Secondary | ICD-10-CM | POA: Diagnosis not present

## 2023-12-28 DIAGNOSIS — Z87891 Personal history of nicotine dependence: Secondary | ICD-10-CM | POA: Insufficient documentation

## 2023-12-28 DIAGNOSIS — Z5329 Procedure and treatment not carried out because of patient's decision for other reasons: Secondary | ICD-10-CM | POA: Insufficient documentation

## 2023-12-28 DIAGNOSIS — N939 Abnormal uterine and vaginal bleeding, unspecified: Secondary | ICD-10-CM

## 2023-12-28 DIAGNOSIS — O99212 Obesity complicating pregnancy, second trimester: Secondary | ICD-10-CM | POA: Diagnosis not present

## 2023-12-28 DIAGNOSIS — D649 Anemia, unspecified: Secondary | ICD-10-CM

## 2023-12-28 DIAGNOSIS — E669 Obesity, unspecified: Secondary | ICD-10-CM | POA: Diagnosis not present

## 2023-12-28 DIAGNOSIS — O99712 Diseases of the skin and subcutaneous tissue complicating pregnancy, second trimester: Secondary | ICD-10-CM | POA: Diagnosis not present

## 2023-12-28 DIAGNOSIS — O4692 Antepartum hemorrhage, unspecified, second trimester: Secondary | ICD-10-CM | POA: Diagnosis present

## 2023-12-28 DIAGNOSIS — O99810 Abnormal glucose complicating pregnancy: Secondary | ICD-10-CM

## 2023-12-28 DIAGNOSIS — O99332 Smoking (tobacco) complicating pregnancy, second trimester: Secondary | ICD-10-CM | POA: Insufficient documentation

## 2023-12-28 DIAGNOSIS — J45909 Unspecified asthma, uncomplicated: Secondary | ICD-10-CM | POA: Insufficient documentation

## 2023-12-28 DIAGNOSIS — O99012 Anemia complicating pregnancy, second trimester: Secondary | ICD-10-CM | POA: Insufficient documentation

## 2023-12-28 DIAGNOSIS — O99512 Diseases of the respiratory system complicating pregnancy, second trimester: Secondary | ICD-10-CM | POA: Insufficient documentation

## 2023-12-28 LAB — URINALYSIS, ROUTINE W REFLEX MICROSCOPIC
Bilirubin Urine: NEGATIVE
Glucose, UA: NEGATIVE mg/dL
Ketones, ur: NEGATIVE mg/dL
Leukocytes,Ua: NEGATIVE
Nitrite: NEGATIVE
Protein, ur: 30 mg/dL — AB
Specific Gravity, Urine: 1.029 (ref 1.005–1.030)
pH: 5 (ref 5.0–8.0)

## 2023-12-28 LAB — WET PREP, GENITAL
Clue Cells Wet Prep HPF POC: NONE SEEN
Sperm: NONE SEEN
Trich, Wet Prep: NONE SEEN
WBC, Wet Prep HPF POC: >=10 — AB (ref <10)
Yeast Wet Prep HPF POC: NONE SEEN

## 2023-12-28 NOTE — MAU Provider Note (Signed)
 Chief Complaint:  Vaginal Bleeding   HPI   None     Bonnie Cobb is a 28 y.o. [id number] at [redacted]w[redacted]d presenting to maternity admissions reporting vaginal bleeding. She endorses vaginal spotting when she first woke up today, and spotting has continued throughout the day. She has not been wearing a pad but notes that she had a small streak of blood left on her underwear when she used the bathroom at around 1430. Denies abdominal pain, fever, vaginal discharge. She tested positive for trichomonas, BV, and vulvovaginal candidiasis on 12/18/2023 and was given metronidazole .  Pregnancy Course: Receives care at [facility] . Prenatal records reviewed.   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 No MFM consult needed. Just needs Type and Crossmatch in labor (in case of PPH etc).   Maternal gonorrhea in second trimester 04/02/2019   NSVD (normal spontaneous vaginal delivery)    Prediabetes in mother during pregnancy    PTSD (post-traumatic stress disorder)    OB History  Gravida Para Term Preterm AB Living  5 1 1  3 1   SAB IAB Ectopic Multiple Live Births  3    1    # Outcome Date GA Lbr Len/2nd Weight Sex Type Anes PTL Lv  5 Current           4 Term 08/04/19 [redacted]w[redacted]d 02:19 / 00:03 3595 g M Vag-Spont None  LIV  3 SAB 06/02/18 [redacted]w[redacted]d         2 SAB           1 SAB            Past Surgical History:  Procedure Laterality Date   NO PAST SURGERIES     Family History  Problem Relation Age of Onset   COPD Mother    Asthma Mother    Asthma Father    Social History   Tobacco Use   Smoking status: Former    Types: Cigars    Smokeless tobacco: Never   Tobacco comments:    Rare vape  Vaping Use   Vaping status: Former  Substance Use Topics   Alcohol use: No   Drug use: Not Currently    Types: Marijuana    Comment: once a month   Allergies  Allergen Reactions   Bee Venom Anaphylaxis   Mushroom    Other     Carmex lip balm, mushrooms   Pineapple Swelling    Tongue Swelling, fresh pineapple only   Poison Ivy Extract Other (See Comments)   No medications prior to admission.    I have reviewed patient's Past Medical Hx, Surgical Hx, Family Hx, Social Hx, medications and allergies.   ROS  Pertinent items noted in HPI and remainder of comprehensive ROS otherwise negative.   PHYSICAL EXAM  Patient Vitals for the past 24 hrs:  BP Temp Pulse Resp SpO2 Weight  12/28/23 1640 136/87 98.4 F (36.9 C) (!) 108 20 100 % --  12/28/23 1633 -- -- -- -- -- 131.5 kg    Constitutional: Well-developed, well-nourished female in no acute distress.  HEENT: atraumatic, normocephalic. Neck has normal ROM. EOM intact. Cardiovascular: normal rate &  rhythm, warm and well-perfused Respiratory: normal effort, no problems with respiration noted GI: Abd soft, non-tender, non-distended MSK: Extremities nontender, no edema, normal ROM Skin: warm and dry. Acyanotic, no jaundice or pallor. Neurologic: Alert and oriented x 4. No abnormal coordination. Psychiatric: Normal mood. Speech not slurred, not rapid/pressured. Patient is cooperative. GU: no CVA tenderness Pelvic exam: VULVA: normal appearing vulva with no masses, tenderness or lesions, VAGINA: normal appearing vagina with normal color and discharge, no lesions, scant amounts of dark vaginal blood, no clots, no active sites of bleeding identified, CERVIX: normal appearing cervix without discharge or lesions, multiparous os, exam chaperoned by Kaylan Cowher RN.    Labs: Results for orders placed or performed during the hospital encounter of 12/28/23 (from the past 24  hours)  Wet prep, genital     Status: Abnormal   Collection Time: 12/28/23  5:50 PM  Result Value Ref Range   Yeast Wet Prep HPF POC NONE SEEN NONE SEEN   Trich, Wet Prep NONE SEEN NONE SEEN   Clue Cells Wet Prep HPF POC NONE SEEN NONE SEEN   WBC, Wet Prep HPF POC >=10 (A) <10   Sperm NONE SEEN   Urinalysis, Routine w reflex microscopic -Urine, Clean Catch     Status: Abnormal   Collection Time: 12/28/23  6:30 PM  Result Value Ref Range   Color, Urine YELLOW YELLOW   APPearance HAZY (A) CLEAR   Specific Gravity, Urine 1.029 1.005 - 1.030   pH 5.0 5.0 - 8.0   Glucose, UA NEGATIVE NEGATIVE mg/dL   Hgb urine dipstick MODERATE (A) NEGATIVE   Bilirubin Urine NEGATIVE NEGATIVE   Ketones, ur NEGATIVE NEGATIVE mg/dL   Protein, ur 30 (A) NEGATIVE mg/dL   Nitrite NEGATIVE NEGATIVE   Leukocytes,Ua NEGATIVE NEGATIVE   RBC / HPF 0-5 0 - 5 RBC/hpf   WBC, UA 6-10 0 - 5 WBC/hpf   Bacteria, UA RARE (A) NONE SEEN   Squamous Epithelial / HPF 6-10 0 - 5 /HPF   Mucus PRESENT    Ca Oxalate Crys, UA PRESENT     Imaging:  No results found.  MDM & MAU COURSE  MDM: Moderate  MAU Course: -Vital signs within normal limits. -Patient has been taking metronidazole , but antifungal medication for candidiasis was never sent. Repeat wet prep today. -Speculum exam shows scant bleeding, no cervical dilation, no active bleeding. -Previous subchorionic hematoma on initial US  over 1 month ago. Repeat US . -Wet prep negative. -Patient left AMA prior to US  results. -Reviewed images with Dr. Herchel, no worrisome findings.  Differential diagnosis considered for 2nd trimester vaginal bleeding includes but is not limited to: subchorionic hematoma, placenta previa, vasa previa, infection, marginal separation, cervical or vaginal lesion   Orders Placed This Encounter  Procedures   Wet prep, genital   Culture, OB Urine   US  MFM OB LIMITED   Urinalysis, Routine w reflex microscopic -Urine, Clean Catch   No  orders of the defined types were placed in this encounter.   ASSESSMENT   1. Vaginal spotting   2. [redacted] weeks gestation of pregnancy     PLAN  Patient left AMA prior to US  results. Continue metronidazole  as prescribed.   Allergies as of 12/28/2023 - Review Complete 12/28/2023  Allergen Reaction Noted   Bee venom Anaphylaxis 11/26/2022   Mushroom  05/22/2023   Other  04/28/2021   Pineapple Swelling 07/22/2019   Poison ivy extract Other (See Comments) 11/12/2023     Joesph DELENA Sear, PA

## 2023-12-28 NOTE — MAU Note (Signed)
.  Bonnie Cobb is a 28 y.o. at [redacted]w[redacted]d here in MAU reporting:vaginal spotting when she woke up today. She arrived at work and now it is heavy. Denies pain now   Onset of complaint: today  Pain score: denies  There were no vitals filed for this visit.    Lab orders placed from triage:   ua

## 2023-12-28 NOTE — Progress Notes (Signed)
 Patients mother exited patients room and stood at the desk and stated we need discharge papers what else are we waiting on? Patients mother told that we are waiting for radiology results. Pts mother aggressively left the unit. Patient then comes out of the room asking how much longer because she needs to go. Patient offered AMA papers, AMA papers signed and patient left appropriately.   Patient had been waiting 2 hours and 30 mins at this point.

## 2023-12-29 LAB — HORIZON CUSTOM: REPORT SUMMARY: POSITIVE — AB

## 2023-12-30 ENCOUNTER — Other Ambulatory Visit

## 2024-01-01 ENCOUNTER — Other Ambulatory Visit

## 2024-01-01 DIAGNOSIS — O99810 Abnormal glucose complicating pregnancy: Secondary | ICD-10-CM

## 2024-01-01 DIAGNOSIS — Z3A14 14 weeks gestation of pregnancy: Secondary | ICD-10-CM

## 2024-01-02 ENCOUNTER — Ambulatory Visit: Payer: Self-pay | Admitting: Obstetrics & Gynecology

## 2024-01-02 LAB — GLUCOSE TOLERANCE, 2 HOURS W/ 1HR
Glucose, 1 hour: 121 mg/dL (ref 70–179)
Glucose, 2 hour: 102 mg/dL (ref 70–152)
Glucose, Fasting: 83 mg/dL (ref 70–91)

## 2024-01-15 ENCOUNTER — Ambulatory Visit: Admitting: Gastroenterology

## 2024-01-15 ENCOUNTER — Encounter: Payer: Self-pay | Admitting: Obstetrics and Gynecology

## 2024-01-15 ENCOUNTER — Ambulatory Visit (INDEPENDENT_AMBULATORY_CARE_PROVIDER_SITE_OTHER): Admitting: Obstetrics and Gynecology

## 2024-01-15 VITALS — BP 112/78 | HR 96 | Wt 289.0 lb

## 2024-01-15 DIAGNOSIS — Z3481 Encounter for supervision of other normal pregnancy, first trimester: Secondary | ICD-10-CM

## 2024-01-15 DIAGNOSIS — O99212 Obesity complicating pregnancy, second trimester: Secondary | ICD-10-CM | POA: Diagnosis not present

## 2024-01-15 DIAGNOSIS — Z3A12 12 weeks gestation of pregnancy: Secondary | ICD-10-CM

## 2024-01-15 DIAGNOSIS — Z3A16 16 weeks gestation of pregnancy: Secondary | ICD-10-CM

## 2024-01-15 DIAGNOSIS — Z3480 Encounter for supervision of other normal pregnancy, unspecified trimester: Secondary | ICD-10-CM

## 2024-01-15 DIAGNOSIS — O99210 Obesity complicating pregnancy, unspecified trimester: Secondary | ICD-10-CM | POA: Insufficient documentation

## 2024-01-15 MED ORDER — ASPIRIN 81 MG PO TBEC: 81.0000 mg | DELAYED_RELEASE_TABLET | Freq: Every day | ORAL | 2 refills | Status: AC

## 2024-01-15 MED ORDER — VITAFOL GUMMIES 3.33-0.333-34.8 MG PO CHEW: 3.0000 | CHEWABLE_TABLET | Freq: Every day | ORAL | 5 refills | Status: DC

## 2024-01-15 NOTE — Progress Notes (Signed)
 PRENATAL VISIT NOTE  Subjective:  Bonnie Cobb is a 28 y.o. G5P1031 at [redacted]w[redacted]d being seen today for ongoing prenatal care.  She is currently monitored for the following issues for this low-risk pregnancy and has Alpha thalassemia silent carrier; Attention deficit hyperactivity disorder; Recurrent cold sores; Major depressive disorder, recurrent severe without psychotic features (HCC); Obesity due to excess calories without serious comorbidity; Thrombocytosis; Iron deficiency anemia due to chronic blood loss; Prediabetes; Elevated total protein; Supervision of other normal pregnancy, antepartum; Prediabetes in mother during pregnancy; Trichomonal vaginitis in pregnancy; Mild intermittent asthma without complication; and Obesity affecting pregnancy on their problem list.  Patient reports no complaints.  Contractions: Not present. Vag. Bleeding: None.   . Denies leaking of fluid.   The following portions of the patient's history were reviewed and updated as appropriate: allergies, current medications, past family history, past medical history, past social history, past surgical history and problem list.   Objective:   Vitals:   01/15/24 1434  BP: 112/78  Pulse: 96  Weight: 289 lb (131.1 kg)    Fetal Status:  Fetal Heart Rate (bpm): 143        General: Alert, oriented and cooperative. Patient is in no acute distress.  Skin: Skin is warm and dry. No rash noted.   Cardiovascular: Normal heart rate noted  Respiratory: Normal respiratory effort, no problems with respiration noted  Abdomen: Soft, gravid, appropriate for gestational age.  Pain/Pressure: Present     Pelvic: Cervical exam deferred        Extremities: Normal range of motion.  Edema: Trace  Mental Status: Normal mood and affect. Normal behavior. Normal judgment and thought content.      12/18/2023    3:29 PM 11/20/2023    2:49 PM 07/05/2022    2:26 PM  Depression screen PHQ 2/9  Decreased Interest 0 0 0  Down, Depressed,  Hopeless 0 0 0  PHQ - 2 Score 0 0 0  Altered sleeping 0 0 0  Tired, decreased energy 2 0 2  Change in appetite 2 0 2  Feeling bad or failure about yourself  0 0 0  Trouble concentrating 0 0 0  Moving slowly or fidgety/restless 0 0 0  Suicidal thoughts 0 0 0  PHQ-9 Score 4 0 4        12/18/2023    3:33 PM 11/20/2023    2:50 PM 05/18/2020   12:36 AM 05/14/2019    9:40 AM  GAD 7 : Generalized Anxiety Score  Nervous, Anxious, on Edge 0 0 1 1  Control/stop worrying 0 0 2 2  Worry too much - different things 0 0 2 2  Trouble relaxing 0 0 2 2  Restless 0 0 3 3  Easily annoyed or irritable 0 0 1 1  Afraid - awful might happen 0 0 3 3  Total GAD 7 Score 0 0 14 14  Anxiety Difficulty   Somewhat difficult Not difficult at all    Assessment and Plan:  Pregnancy: G5P1031 at [redacted]w[redacted]d 1. Supervision of other normal pregnancy, antepartum (Primary) Patient is doing well without complaints Anatomy ultrasound 11/21 at 10 am Patient declined AFP and repeat NIPS collection  2. [redacted] weeks gestation of pregnancy   3. Obesity affecting pregnancy in second trimester, unspecified obesity type Nl early glucola Patient has not started ASA yet  Preterm labor symptoms and general obstetric precautions including but not limited to vaginal bleeding, contractions, leaking of fluid and fetal movement were reviewed in  detail with the patient. Please refer to After Visit Summary for other counseling recommendations.   Return in about 4 weeks (around 02/12/2024) for in person, ROB, Low risk.  Future Appointments  Date Time Provider Department Center  01/15/2024  2:50 PM Cereniti Curb, Winton, MD [facility] None  01/31/2024 10:00 AM [facility] PROVIDER 1 [facility]  01/31/2024 10:30 AM [facility] US1 [facility]  03/02/2024  1:30 PM McMichael, Bayley M, PA-C [facility]    Winton Felt, MD

## 2024-01-15 NOTE — Progress Notes (Addendum)
 US  scheduled for 01/28/24 @10 :00 am.  Declined AFP today.

## 2024-01-31 ENCOUNTER — Other Ambulatory Visit: Payer: Self-pay | Admitting: *Deleted

## 2024-01-31 ENCOUNTER — Ambulatory Visit: Attending: Obstetrics and Gynecology | Admitting: Obstetrics

## 2024-01-31 ENCOUNTER — Ambulatory Visit (HOSPITAL_BASED_OUTPATIENT_CLINIC_OR_DEPARTMENT_OTHER)

## 2024-01-31 VITALS — BP 126/69 | HR 101

## 2024-01-31 DIAGNOSIS — Z148 Genetic carrier of other disease: Secondary | ICD-10-CM | POA: Diagnosis not present

## 2024-01-31 DIAGNOSIS — Z3480 Encounter for supervision of other normal pregnancy, unspecified trimester: Secondary | ICD-10-CM

## 2024-01-31 DIAGNOSIS — Z363 Encounter for antenatal screening for malformations: Secondary | ICD-10-CM

## 2024-01-31 DIAGNOSIS — O99212 Obesity complicating pregnancy, second trimester: Secondary | ICD-10-CM | POA: Insufficient documentation

## 2024-01-31 DIAGNOSIS — O99213 Obesity complicating pregnancy, third trimester: Secondary | ICD-10-CM | POA: Insufficient documentation

## 2024-01-31 DIAGNOSIS — Z3A19 19 weeks gestation of pregnancy: Secondary | ICD-10-CM

## 2024-01-31 DIAGNOSIS — Z3482 Encounter for supervision of other normal pregnancy, second trimester: Secondary | ICD-10-CM

## 2024-01-31 DIAGNOSIS — O280 Abnormal hematological finding on antenatal screening of mother: Secondary | ICD-10-CM

## 2024-01-31 DIAGNOSIS — O289 Unspecified abnormal findings on antenatal screening of mother: Secondary | ICD-10-CM | POA: Insufficient documentation

## 2024-01-31 DIAGNOSIS — Z362 Encounter for other antenatal screening follow-up: Secondary | ICD-10-CM

## 2024-01-31 DIAGNOSIS — Z3689 Encounter for other specified antenatal screening: Secondary | ICD-10-CM | POA: Diagnosis not present

## 2024-01-31 NOTE — Progress Notes (Signed)
 MFM Consult Note  Bonnie Cobb is currently at [redacted]w[redacted]d. She was seen today for a detailed fetal anatomy scan due to maternal obesity with a BMI of 51.  She denies any significant past medical history and denies any problems in her current pregnancy.    Her cell free DNA test drawn earlier in her pregnancy could not provide a risk assessment due to insufficient fetal DNA.  Her [facility] screening test indicated that she is a silent carrier for alpha thalassemia.  Sonographic findings Single intrauterine pregnancy at 19w 0d. Fetal cardiac activity:  Observed and appears normal. Presentation: Cephalic. The views of the fetal anatomy were limited today due to maternal body habitus and the fetal position.  What was visualized today appeared within normal limits. Fetal biometry shows the estimated fetal weight of 0 lb 10 oz, 282 grams (61%). Amniotic fluid: Within normal limits.  MVP: 6.15 cm. Placenta: Anterior. Adnexa: No abnormality visualized. Cervical length: 3.6 cm.  The patient was informed that anomalies may be missed due to technical limitations. If the fetus is in a suboptimal position or maternal habitus is increased, visualization of the fetus in the maternal uterus may be impaired.  Silent Carrier for Alpha Thalassemia  The patient was advised that alpha thalassemia is inherited in an autosomal recessive fashion.   She was advised to have her partner tested to determine if he is also a carrier for this condition.    Should he screen negative for alpha thalassemia, their child should not be at risk for inheriting this disease.  Should both parents be carriers for this condition, their child may have a 25% chance of inheriting the disease.    Should both parents be identified as carriers for this condition, she understands that an amniocentesis is available for definitive prenatal diagnosis.  The patient will be meeting with our genetic counselor next week and will consider  getting her partner screened soon.  She will also consider getting a repeat cell free DNA test drawn to screen for fetal aneuploidy.  Obesity in Pregnancy  Maternal obesity increases the risk of adverse pregnancy outcomes such as miscarriages/stillbirth, congenital anomalies such as neural tube defects/spina bifida, and may increase the risk of preeclampsia and gestational diabetes requiring an indicated preterm birth.  The recommended total weight gain in pregnancy for obese women is between 10 to 20 pounds.  Her early screen for gestational diabetes was negative.  She should be screened for gestational diabetes again at 28 weeks.    Will continue to follow her with monthly growth ultrasounds.  Due to the increased risk of stillbirth in obese women with a BMI of greater than 40, weekly fetal testing using either NSTs or BPP's should be started at around 34 weeks.  She should start taking a daily baby aspirin  (81 mg) for preeclampsia prophylaxis.  As maternal obesity may present challenges associated with the management of anesthesia, an anesthesia consult should be obtained when she is admitted in labor.  Delivery should be considered at around 39 weeks.  A follow-up exam was scheduled in 4 weeks to complete the views of the fetal anatomy.    The patient stated that all of her questions were answered.   A total of 45 minutes was spent counseling and coordinating the care for this patient.  Greater than 50% of the time was spent in direct face-to-face contact.

## 2024-02-03 ENCOUNTER — Encounter: Payer: Self-pay | Admitting: Obstetrics and Gynecology

## 2024-02-04 ENCOUNTER — Ambulatory Visit: Attending: Obstetrics

## 2024-02-04 DIAGNOSIS — O285 Abnormal chromosomal and genetic finding on antenatal screening of mother: Secondary | ICD-10-CM | POA: Diagnosis not present

## 2024-02-04 DIAGNOSIS — D563 Thalassemia minor: Secondary | ICD-10-CM

## 2024-02-04 DIAGNOSIS — O0972 Supervision of high risk pregnancy due to social problems, second trimester: Secondary | ICD-10-CM

## 2024-02-04 DIAGNOSIS — O99012 Anemia complicating pregnancy, second trimester: Secondary | ICD-10-CM

## 2024-02-04 DIAGNOSIS — Z3A19 19 weeks gestation of pregnancy: Secondary | ICD-10-CM

## 2024-02-04 NOTE — Progress Notes (Signed)
 [facility] at [facility] [address], [address] Phone:  [phone]   Fax:  [phone]      Virtual Visit via Video Note  I connected with  Bonnie Cobb on 02/04/24 at  9:00 AM EST by a video enabled telemedicine application and verified that I am speaking with the correct person using two identifiers.  Location: Patient: Car, not driving, [place]. Provider: Maternal Fetal Care.   I discussed the limitations of evaluation and management by telemedicine and the availability of in person appointments. The patient expressed understanding and agreed to proceed.  Genetic Counseling Clinic Note:   I spoke with 28 y.o. Bonnie Cobb today to discuss her carrier screening and NIPS results. She was referred by Herchel Gloris LABOR, MD. She was accompanied by her mother for the first portion of the session.   Pregnancy History:    [id number]. EGA: [redacted]w[redacted]d by US . EDD: 06/26/2024. Bonnie Cobb has a 82 yo son. She reports 3 first trimester SABs, which she states likely occurred due to stress. We discussed that miscarriages can be due to many factors including an individual being a balanced translocation carrier. If desired, we are happy to further discuss this and offer additional genetic testing related to the SABs. Personal history of anemia. Denies major personal health concerns. Reports bleeding in early pregnancy that was evaluated by her OB. Denies bleeding, infections, and fevers in this pregnancy. Denies using tobacco, alcohol, or street drugs in this pregnancy.   Family History:    A three-generation pedigree was created and scanned into Epic under the Media tab.  Patient reports her mother had a son who passed away on DOL 1. No information is known. We reviewed that without additional information, recurrence risk is limited. If she learns of additional information, we are happy to revisit.  Patient has limited family history for herself and  FOB.  Patient ethnicity reported as multiracial and FOB ethnicity reported as Black. Denies Ashkenazi Jewish ancestry.  Family history otherwise not remarkable for consanguinity, individuals with birth defects, intellectual disability, autism spectrum disorder, multiple spontaneous abortions, still births, or unexplained neonatal death.   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 She screened negative for the other three conditions (CF, SMA, and beta-hemoglobinopathies) which significantly reduces but does not eliminate the chance of being a carrier for those conditions. Please see report for residual risk information.  We reviewed the genetics of alpha thalassemia, autosomal recessive mode of inheritance, and clinical features of these conditions. We reviewed that Bonnie Cobb will either pass down two copies of the alpha globin gene (??) OR one copy of the alpha globin gene (-?) in each pregnancy. Therefore, this pregnancy is not at increased risk for hemoglobin Bart's due to four deletions of the alpha globin genes (--/--) regardless of her reproductive partner's carrier status. The pregnancy will be at increased risk (25%) for hemoglobin H disease if her partner is an alpha thalassemia carrier in the cis configuration (--/??). We reviewed that this is more common in Southeast Asian populations and less common in those with Black ancestry.  Given these results, we discussed and offered carrier screening for Bonnie Cobb's reproductive partner. We reviewed the benefits and limitations of carrier screening and that it can detect most but not all carriers.  She stated she will discuss with him  and will inform us  if/when he wishes to proceed with testing. We reviewed that if both were found to be carriers, prenatal diagnosis through amniocentesis would be available. We reviewed the  technical aspects, benefits, risks, and limitations of amniocentesis including the 1 in 500 risk for miscarriage.  Alternatively, testing can be completed postnatally. Of note, alpha thalassemia is not included on [name] 's Newborn Screening (NBS) program. She declined amniocentesis.  Newborn Screening. The [place]  Newborn Screening (NBS) program will screen all newborn babies for cystic fibrosis, spinal muscular atrophy, hemoglobinopathies, and numerous other conditions.  Low Fetal Fraction on NIPS:  Noninvasive prenatal screening (NIPS) analyzes cell-free DNA originating from the placenta that is found in the maternal blood circulation during pregnancy to provide information regarding the presence or absence of extra DNA for chromosomes 13, 18, and 21 as well as the sex chromosomes. Bonnie Cobb's NIPS results from [facility] lab returned as a no-call due to low fetal fraction. Her blood draw had a low fetal fraction of 3.8% at [redacted]w[redacted]d. Due to this low fetal fraction, the lab was not able to estimate risks for common chromosomal differences. Low fetal fraction can be associated with early gestational age, high maternal weight, suboptimal sample collection, pregnancies conceived using in vitro fertilization, use of certain medications such as blood thinners, certain maternal health conditions such as some autoimmune disorders, and fetal chromosomal abnormalities. Moreover, this result may be due to normal variation.  We discussed that it is currently unknown why Bonnie Cobb has low fetal fraction; however, it may be related to high maternal weight and early gestational age when the sample was collected. However, it can be associated to a chromosome difference in the fetus, as pregnancies with aneuploidy can also result in low fetal fraction. Based on these results, we discussed further testing/screening options including repeat NIPS through [facility] or a different laboratory, diagnostic testing through  amniocentesis, and monitoring through ultrasounds. The benefits and limitations of each were reviewed including the 1 in 500 risk for miscarriage by amniocentesis. We reviewed that no screen can act as a diagnostic tool, and low-risk NIPS and/or normal ultrasounds do not guarantee a healthy pregnancy. Moreover, other labs that offer NIPS are not able to screen for triploidy. She declined amniocentesis and elected repeat [facility] NIPS at her next OB visit on 02/12/2024.  Social Determinants of Health:  Patient expressed concern with housing and her current living situation. Her son is in foster care. She states she is currently working with a pregnancy advocate but would like a referral to [facility]BH.   Plan of Care:   Referral to [facility] placed. Patient will discuss with FOB regarding alpha thalassemia carrier screening and inform us  of their testing plans. Newborn screening. Postnatal referral to hematology as clinically indicated for alpha thalassemia. Declined amniocentesis. Repeat [facility] NIPS to be drawn at next OB visit on 02/12/2024.   Informed consent was obtained. All questions were answered.   70 minutes were spent on the date of the encounter in service to the patient including preparation, consultation through video chat, discussion of test reports and available next steps, pedigree construction, genetic risk assessment, documentation, and care coordination.    Thank you for sharing in the care of Glinda with us .  Please do not hesitate to contact us  at [phone] if you have any questions.   Lauraine Bodily, MS, [facility] Certified Genetic Counselor   Genetic counseling student involved in appointment: No.

## 2024-02-12 ENCOUNTER — Encounter: Payer: Self-pay | Admitting: Physician Assistant

## 2024-02-12 ENCOUNTER — Ambulatory Visit: Admitting: Physician Assistant

## 2024-02-12 ENCOUNTER — Other Ambulatory Visit (HOSPITAL_COMMUNITY)
Admission: RE | Admit: 2024-02-12 | Discharge: 2024-02-12 | Disposition: A | Source: Ambulatory Visit | Attending: Obstetrics and Gynecology | Admitting: Obstetrics and Gynecology

## 2024-02-12 VITALS — BP 119/78 | HR 91 | Wt 293.6 lb

## 2024-02-12 DIAGNOSIS — O285 Abnormal chromosomal and genetic finding on antenatal screening of mother: Secondary | ICD-10-CM

## 2024-02-12 DIAGNOSIS — Z6841 Body Mass Index (BMI) 40.0 and over, adult: Secondary | ICD-10-CM | POA: Diagnosis not present

## 2024-02-12 DIAGNOSIS — A5901 Trichomonal vulvovaginitis: Secondary | ICD-10-CM

## 2024-02-12 DIAGNOSIS — Z3480 Encounter for supervision of other normal pregnancy, unspecified trimester: Secondary | ICD-10-CM

## 2024-02-12 DIAGNOSIS — O23599 Infection of other part of genital tract in pregnancy, unspecified trimester: Secondary | ICD-10-CM

## 2024-02-12 DIAGNOSIS — R7309 Other abnormal glucose: Secondary | ICD-10-CM

## 2024-02-12 DIAGNOSIS — Z3A20 20 weeks gestation of pregnancy: Secondary | ICD-10-CM

## 2024-02-12 DIAGNOSIS — J45909 Unspecified asthma, uncomplicated: Secondary | ICD-10-CM

## 2024-02-12 MED ORDER — ALBUTEROL SULFATE 1.25 MG/3ML IN NEBU: 1.0000 | INHALATION_SOLUTION | Freq: Four times a day (QID) | RESPIRATORY_TRACT | 12 refills | Status: AC | PRN

## 2024-02-12 NOTE — Progress Notes (Signed)
 PRENATAL VISIT NOTE  Subjective:  Bonnie Cobb is a 28 y.o. G5P1031 at [redacted]w[redacted]d being seen today for ongoing prenatal care.  She is currently monitored for the following issues for this high-risk pregnancy and has Alpha thalassemia silent carrier; Attention deficit hyperactivity disorder; Recurrent cold sores; Major depressive disorder, recurrent severe without psychotic features (HCC); Obesity due to excess calories without serious comorbidity; Thrombocytosis; Iron deficiency anemia due to chronic blood loss; Prediabetes; Elevated total protein; Supervision of other normal pregnancy, antepartum; Prediabetes in mother during pregnancy; Trichomonal vaginitis in pregnancy; Mild intermittent asthma without complication; Obesity affecting pregnancy; and Low fetal fraction on NIPS on their problem list.  Patient reports lost her nebulizer in a move, needs new one to do breathing treatment once monthly for now.  Concern for vaginal odor. Declines AFP.   Contractions: Irritability. Vag. Bleeding: None.  Movement: Present. Denies leaking of fluid.   The following portions of the patient's history were reviewed and updated as appropriate: allergies, current medications, past family history, past medical history, past social history, past surgical history and problem list.   Objective:   Vitals:   02/12/24 1515 02/12/24 1531  BP: (!) 143/82 119/78  Pulse: 97 91  Weight: 293 lb 9.6 oz (133.2 kg)     Fetal Status:  Fetal Heart Rate (bpm): 152 Fundal Height: 20 cm Movement: Present    General: Alert, oriented and cooperative. Patient is in no acute distress.  Skin: Skin is warm and dry. No rash noted.   Cardiovascular: Normal heart rate noted  Respiratory: Normal respiratory effort, no problems with respiration noted  Abdomen: Soft, gravid, appropriate for gestational age.  Pain/Pressure: Present     Pelvic: Cervical exam deferred        Extremities: Normal range of motion.  Edema: Trace  Mental  Status: Normal mood and affect. Normal behavior. Normal judgment and thought content.      12/18/2023    3:29 PM 11/20/2023    2:49 PM 07/05/2022    2:26 PM  Depression screen PHQ 2/9  Decreased Interest 0 0 0  Down, Depressed, Hopeless 0 0 0  PHQ - 2 Score 0 0 0  Altered sleeping 0 0 0  Tired, decreased energy 2 0 2  Change in appetite 2 0 2  Feeling bad or failure about yourself  0 0 0  Trouble concentrating 0 0 0  Moving slowly or fidgety/restless 0 0 0  Suicidal thoughts 0 0 0  PHQ-9 Score 4  0  4      Data saved with a previous flowsheet row definition        12/18/2023    3:33 PM 11/20/2023    2:50 PM 05/18/2020   12:36 AM 05/14/2019    9:40 AM  GAD 7 : Generalized Anxiety Score  Nervous, Anxious, on Edge 0 0 1 1  Control/stop worrying 0 0 2 2  Worry too much - different things 0 0 2 2  Trouble relaxing 0 0 2 2  Restless 0 0 3 3  Easily annoyed or irritable 0 0 1 1  Afraid - awful might happen 0 0 3 3  Total GAD 7 Score 0 0 14 14  Anxiety Difficulty   Somewhat difficult Not difficult at all    Assessment and Plan:  Pregnancy: G5P1031 at [redacted]w[redacted]d  1. Supervision of other normal pregnancy, antepartum (Primary) Patient doing well, feeling regular fetal movement BP, FHR, FH appropriate   2. [redacted] weeks gestation of pregnancy  Anticipatory guidance about next visits/weeks of pregnancy given.   3. BMI 45.0-49.9, adult [facility]) Following MFM Next scan 03/03/24  4. Elevated hemoglobin A1c 5.7% Passed early GTT Repeat at 28 weeks  5. Trichomonal vaginitis during pregnancy, antepartum Dx 12/18/23 TOC today  6. Low fetal fraction on NIPS Patient politely declines repeat NIPT  7. Asthma, unspecified asthma severity, unspecified whether complicated, unspecified whether persistent Patient advised that she should be using inhaler with steroid component, but was concerned about using it due to grandmother's past experience with steroids. She would like to take time to think  about new inhaler.  - For home use only DME Nebulizer machine - albuterol  (ACCUNEB ) 1.25 MG/3ML nebulizer solution; Take 3 mLs (1.25 mg total) by nebulization every 6 (six) hours as needed for wheezing.  Dispense: 75 mL; Refill: 12   Preterm labor symptoms and general obstetric precautions including but not limited to vaginal bleeding, contractions, leaking of fluid and fetal movement were reviewed in detail with the patient.  Please refer to After Visit Summary for other counseling recommendations.   No follow-ups on file.  Future Appointments  Date Time Provider Department Center  03/02/2024  1:30 PM Mollie Nestor CHRISTELLA DEVONNA BASILIO [facility]  03/03/2024  3:15 PM [facility] PROVIDER 1 [facility]  03/03/2024  3:30 PM [facility] US5 [facility]  03/11/2024  1:50 PM Hoyle Garre, NP [facility] None    Jorene FORBES Moats, PA-C

## 2024-02-12 NOTE — Progress Notes (Signed)
 Pt presents for ROB visit. C/o pelvic pain and vaginal odor. Requesting nebulizer  Pt has concerns about paternity   Declines AFP

## 2024-02-13 LAB — CERVICOVAGINAL ANCILLARY ONLY
Chlamydia: NEGATIVE
Comment: NEGATIVE
Comment: NEGATIVE
Comment: NORMAL
Neisseria Gonorrhea: NEGATIVE
Trichomonas: POSITIVE — AB

## 2024-02-14 ENCOUNTER — Ambulatory Visit: Payer: Self-pay | Admitting: Physician Assistant

## 2024-02-17 ENCOUNTER — Ambulatory Visit: Payer: Self-pay

## 2024-02-17 MED ORDER — METRONIDAZOLE 500 MG PO TABS: 500.0000 mg | ORAL_TABLET | Freq: Two times a day (BID) | ORAL | 0 refills | Status: AC

## 2024-03-02 ENCOUNTER — Encounter: Payer: Self-pay | Admitting: Gastroenterology

## 2024-03-02 ENCOUNTER — Ambulatory Visit: Admitting: Gastroenterology

## 2024-03-02 VITALS — BP 132/78 | HR 100 | Ht 63.0 in | Wt 294.1 lb

## 2024-03-02 DIAGNOSIS — K5900 Constipation, unspecified: Secondary | ICD-10-CM

## 2024-03-02 DIAGNOSIS — E669 Obesity, unspecified: Secondary | ICD-10-CM

## 2024-03-02 DIAGNOSIS — Z6841 Body Mass Index (BMI) 40.0 and over, adult: Secondary | ICD-10-CM | POA: Diagnosis not present

## 2024-03-02 DIAGNOSIS — K219 Gastro-esophageal reflux disease without esophagitis: Secondary | ICD-10-CM | POA: Diagnosis not present

## 2024-03-02 MED ORDER — OMEPRAZOLE 20 MG PO CPDR: 20.0000 mg | DELAYED_RELEASE_CAPSULE | Freq: Every day | ORAL | 4 refills | Status: AC

## 2024-03-02 NOTE — Patient Instructions (Addendum)
 We have sent the following medications to your pharmacy for you to pick up at your convenience: Omeprazole  20 mg once a day.  Start taking Miralax  1 capful (17 grams) 1x / day for 1 week.   If this is not effective, increase to 1 dose 2x / day for 1 week.   If this is still not effective, increase to two capfuls (34 grams) 2x / day.   Can adjust dose as needed based on response. Can take 1/2 cap daily, skip days, or increase per day.   Common Ailments & Safe OTC Options Pain & Fever: Tylenol  (Acetaminophen ): Allergies & Cold: Antihistamines: Zyrtec  (cetirizine ), Claritin (loratadine), Benadryl  (diphenhydramine ) Congestion: Saline nasal spray, plain Mucinex (guaifenesin), Flonase , Sudafed (pseudoephedrine) - use with caution/provider approval Cough: Delsym, Robitussin DM (dextromethorphan), throat lozenges Nausea & Vomiting: Ginger, Vitamin B6 (pyridoxine ) + Unisom  (doxylamine ), Emetrol, Sea-Bands Heartburn & Indigestion: Tums, Rolaids, Maalox, Mylanta, Gaviscon, Pepcid  AC (famotidine ) Constipation: Fiber supplements (Metamucil, Citrucel), Colace (docusate), Miralax  Hemorrhoids: Witch hazel pads (Tucks), Preparation H, Epsom salt baths   Thank you for trusting me with your gastrointestinal care!   Nestor Blower, PA  _______________________________________________________  If your blood pressure at your visit was 140/90 or greater, please contact your primary care physician to follow up on this.  _______________________________________________________  If you are age 26 or older, your body mass index should be between 23-30. Your Body mass index is 52.1 kg/m. If this is out of the aforementioned range listed, please consider follow up with your Primary Care Provider.  If you are age 27 or younger, your body mass index should be between 19-25. Your Body mass index is 52.1 kg/m. If this is out of the aformentioned range listed, please consider follow up with your Primary Care  Provider.   ________________________________________________________  The [facility] [facility] providers would like to encourage you to use MYCHART to communicate with providers for non-urgent requests or questions.  Due to long hold times on the telephone, sending your provider a message by [facility] may be a faster and more efficient way to get a response.  Please allow 48 business hours for a response.  Please remember that this is for non-urgent requests.  _______________________________________________________  [facility] is using a team-based approach to care.  Your team is made up of your doctor and two to three APPS. Our APPS (Nurse Practitioners and Physician Assistants) work with your physician to ensure care continuity for you. They are fully qualified to address your health concerns and develop a treatment plan. They communicate directly with your gastroenterologist to care for you. Seeing the Advanced Practice Practitioners on your physician's team can help you by facilitating care more promptly, often allowing for earlier appointments, access to diagnostic testing, procedures, and other specialty referrals.

## 2024-03-02 NOTE — Progress Notes (Signed)
 "  Chief Complaint: GERD Primary GI MD: Unassigned  HPI: Discussed the use of AI scribe software for clinical note transcription with the patient, who gave verbal consent to proceed.  History of Present Illness  Bonnie Cobb is a 28 year old female with asthma who presents for evaluation of severe acid reflux during pregnancy.  Currently 23 weeks and 3 days pregnant, she describes severe acid reflux that has significantly disrupted sleep and daily comfort. Symptoms include inability to sleep due to discomfort, frequent need to remain upright to attempt sleep, and persistent need to burp even after waiting an hour post-meal before lying down. Even drinking water can trigger reflux. Similar severe symptoms occurred during her previous pregnancy.  No medications for reflux, including Tums, omeprazole , Pepcid , or other over-the-counter remedies, have been used prior to this visit. Dietary modifications, such as waiting after eating before lying down and avoiding certain foods, have not alleviated symptoms. Cravings include fried chicken and baked macaroni and cheese, with increased fruit intake (bananas, strawberries, green grapes). Apples are rarely eaten unless stewed, and most citrus and pineapple are avoided. Significant stress related to housing and disability issues is noted, and she is aware that stress may worsen symptoms.  Constipation is also present, and she has not used MiraLAX , fiber supplements, or Metamucil. No recent gastrointestinal bleeding. Vaginal bleeding occurred at 3-4 months gestation, evaluated in the hospital and found to be normal, with no further bleeding since then.  She is currently unemployed, attempting to obtain disability benefits, and struggling to find housing before delivery. Stress is managed by playing video games. Insurance is [employer].   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 No MFM consult needed. Just needs Type and Crossmatch in labor (in case of PPH etc).   Maternal gonorrhea in second trimester 04/02/2019   NSVD (normal spontaneous vaginal delivery)    Prediabetes in mother during pregnancy    PTSD (post-traumatic stress disorder)     Past Surgical History:  Procedure Laterality Date   NO PAST SURGERIES      Current Outpatient Medications  Medication Sig Dispense Refill   albuterol  (ACCUNEB ) 1.25 MG/3ML nebulizer solution Take 3 mLs (1.25 mg total) by nebulization every 6 (six) hours as needed for wheezing. 75 mL 12   albuterol  (VENTOLIN  HFA) 108 (90 Base) MCG/ACT inhaler Inhale 1-2 puffs into the lungs every 6 (six) hours as needed for wheezing or shortness of breath. 18 g 1   Blood Pressure Monitoring (BLOOD PRESSURE KIT) DEVI 1 Device by Does not apply route once a week. 1 each 0   hydrOXYzine (ATARAX) 10 MG tablet Take 10 mg by mouth 2 (two) times daily as needed for anxiety.     omeprazole  (PRILOSEC) 20 MG capsule Take 1 capsule (20 mg total) by mouth daily. 30 capsule 4   Prenatal Vit-Fe Phos-FA-Omega (VITAFOL  GUMMIES) 3.33-0.333-34.8 MG CHEW Chew 3 tablets by mouth daily. 90 tablet 5   aspirin  EC 81 MG tablet Take 1 tablet (81 mg total) by mouth daily. Start taking when you are [redacted] weeks pregnant for rest of pregnancy for prevention of preeclampsia (Patient not taking: Reported on 03/02/2024) 300 tablet  2   buPROPion (ZYBAN) 150 MG 12 hr tablet Take 150 mg by mouth daily. (Patient not taking: Reported on 03/02/2024)     Doxylamine -Pyridoxine  (DICLEGIS ) 10-10 MG TBEC Take 2 tablets by mouth at bedtime. If symptoms persist, add one tablet in the morning and one in the afternoon (Patient  not taking: Reported on 03/02/2024) 100 tablet 5   famotidine  (PEPCID ) 20 MG tablet Take 1 tablet (20 mg total) by mouth 2 (two) times daily. (Patient not taking: Reported on 03/02/2024) 60 tablet 3   guanFACINE (TENEX) 2 MG tablet Take 2 mg by mouth at bedtime. (Patient not taking: Reported on 03/02/2024)     promethazine  (PHENERGAN ) 25 MG tablet Take 1 tablet (25 mg total) by mouth every 6 (six) hours as needed. Can insert vaginally, if unable to keep anything down (Patient not taking: Reported on 03/02/2024) 30 tablet 0   No current facility-administered medications for this visit.    Allergies as of 03/02/2024 - Review Complete 03/02/2024  Allergen Reaction Noted   Bee venom Anaphylaxis 11/26/2022   Mushroom  05/22/2023   Other  04/28/2021   Pineapple Swelling 07/22/2019   Poison ivy extract Other (See Comments) 11/12/2023    Family History  Problem Relation Age of Onset   COPD Mother    Asthma Mother    Asthma Father     Social History   Socioeconomic History   Marital status: Single    Spouse name: Not on file   Number of children: Not on file   Years of education: Not on file   Highest education level: Not on file  Occupational History   Not on file  Tobacco Use   Smoking status: Former    Types: Cigars   Smokeless tobacco: Never   Tobacco comments:    Rare vape  Vaping Use   Vaping status: Former   Substances: Flavoring  Substance and Sexual Activity   Alcohol use: No   Drug use: Not Currently    Types: Marijuana    Comment: once a month   Sexual activity: Yes    Birth control/protection: None  Other Topics Concern   Not on file  Social History Narrative   Not on file   Social Drivers of Health   Tobacco Use: Medium Risk (01/31/2024)   Patient History    Smoking Tobacco Use: Former    Smokeless Tobacco Use: Never    Passive Exposure: Not on Actuary Strain: Not on File (06/29/2021)   Received from [employer]     Financial Resource Strain: 0  Food Insecurity: Not on File (12/06/2022)   Received from [facility] Insecurity    Food: 0  Transportation Needs: Not on File (06/29/2021)   Received from [employer] Needs    Transportation: 0  Physical Activity: Not on File (06/29/2021)   Received from [facility]   Physical Activity    Physical Activity: 0  Stress: Not on File (06/29/2021)   Received from [facility]   Stress    Stress: 0  Social Connections: Not on File (11/24/2022)   Received from [facility]   Social Connections    Connectedness: 0  Intimate Partner Violence: Not on file  Depression (PHQ2-9): Low Risk (12/18/2023)   Depression (PHQ2-9)    PHQ-2 Score: 4  Alcohol Screen: Not on file  Housing: Not on file  Utilities: Not on file  Health Literacy: Not on file    Review of  Systems:    Constitutional: No weight loss, fever, chills, weakness or fatigue HEENT: Eyes: No change in vision               Ears, Nose, Throat:  No change in hearing or congestion Skin: No rash or itching Cardiovascular: No chest pain, chest pressure or palpitations   Respiratory: No SOB or cough Gastrointestinal: See HPI and otherwise negative Genitourinary: No dysuria or change in urinary frequency Neurological: No headache, dizziness or syncope Musculoskeletal: No new muscle or joint pain Hematologic: No bleeding or bruising Psychiatric: No history of depression or anxiety    Physical Exam:  Vital signs: BP 132/78   Pulse 100   Ht 5' 3 (1.6 m)   Wt 294 lb 2 oz (133.4 kg)   LMP  (LMP Unknown)   BMI 52.10 kg/m   Constitutional: NAD, alert and cooperative Head:  Normocephalic and atraumatic. Eyes:   PEERL, EOMI. No icterus. Conjunctiva pink. Respiratory: Respirations even and unlabored. Lungs clear to auscultation bilaterally.   No wheezes, crackles, or rhonchi.  Cardiovascular:  Regular rate and rhythm. No peripheral edema, cyanosis or pallor.  Gastrointestinal:  Soft, nondistended,  nontender. No rebound or guarding. Normal bowel sounds. No appreciable masses or hepatomegaly. Rectal:  Declines Msk:  Symmetrical without gross deformities. Without edema, no deformity or joint abnormality.  Neurologic:  Alert and  oriented x4;  grossly normal neurologically.  Skin:   Dry and intact without significant lesions or rashes. Psychiatric: Oriented to person, place and time. Demonstrates good judgement and reason without abnormal affect or behaviors.  Physical Exam    RELEVANT LABS AND IMAGING: CBC    Component Value Date/Time   WBC 8.7 12/18/2023 1628   WBC 9.0 11/12/2023 1526   RBC 4.67 12/18/2023 1628   RBC 4.83 11/12/2023 1526   HGB 11.1 12/18/2023 1628   HCT 35.1 12/18/2023 1628   PLT 403 12/18/2023 1628   MCV 75 (L) 12/18/2023 1628   MCH 23.8 (L) 12/18/2023 1628   MCH 23.8 (L) 11/12/2023 1526   MCHC 31.6 12/18/2023 1628   MCHC 32.1 11/12/2023 1526   RDW 16.3 (H) 12/18/2023 1628   LYMPHSABS 3.7 (H) 12/18/2023 1628   MONOABS 0.4 10/01/2022 1434   EOSABS 0.1 12/18/2023 1628   BASOSABS 0.0 12/18/2023 1628    CMP     Component Value Date/Time   NA 137 12/18/2023 1628   K 4.1 12/18/2023 1628   CL 101 12/18/2023 1628   CO2 19 (L) 12/18/2023 1628   GLUCOSE 83 12/18/2023 1628   GLUCOSE 98 04/17/2023 0840   BUN 6 12/18/2023 1628   CREATININE 0.58 12/18/2023 1628   CALCIUM  9.7 12/18/2023 1628   PROT 7.8 12/18/2023 1628   ALBUMIN 4.1 12/18/2023 1628   AST 14 12/18/2023 1628   ALT 13 12/18/2023 1628   ALKPHOS 58 12/18/2023 1628   BILITOT 0.2 12/18/2023 1628   GFRNONAA >60 04/17/2023 0840   GFRAA >60 11/04/2019 2221     Assessment/Plan:   Gastroesophageal reflux disease in pregnancy Severe, recurrent symptoms during pregnancy, currently uncontrolled and significantly impairing quality of life. - Prescribed omeprazole  20 mg orally once daily, 30 minutes before breakfast (this is safe in pregnancy). - Advised calcium  carbonate (Tums) as needed -  Further follow-up recommended with OB GYN - Provided lifestyle and dietary counseling: remain upright after eating, avoid recumbency for at least three hours postprandially, avoid spicy and acidic foods (e.g., tomatoes, pizza, chocolate, caffeine, citrus, pineapple), and elevate head of bed  if needed. - Instructed to contact via MyChart if symptoms persist or worsen for medication adjustment.  Constipation in pregnancy Mild to moderate constipation likely exacerbated by pregnancy.  - Recommended polyethylene glycol (MiraLAX ) powder, one capful daily in water, coffee, or tea, with dose adjustment as needed;  -Increase water, increase fiber, increase exercise  Obesity (BMI 52)  Assigned to Dr. Stacia (Monday)  Nestor Blower, PA-C [facility] 03/02/2024, 3:00 PM  Cc: No ref. provider found "

## 2024-03-03 ENCOUNTER — Ambulatory Visit

## 2024-03-03 ENCOUNTER — Ambulatory Visit: Attending: Obstetrics and Gynecology | Admitting: Obstetrics and Gynecology

## 2024-03-03 ENCOUNTER — Other Ambulatory Visit

## 2024-03-03 VITALS — BP 126/74 | HR 85

## 2024-03-03 DIAGNOSIS — D563 Thalassemia minor: Secondary | ICD-10-CM

## 2024-03-03 DIAGNOSIS — O321XX0 Maternal care for breech presentation, not applicable or unspecified: Secondary | ICD-10-CM | POA: Diagnosis not present

## 2024-03-03 DIAGNOSIS — O285 Abnormal chromosomal and genetic finding on antenatal screening of mother: Secondary | ICD-10-CM | POA: Diagnosis not present

## 2024-03-03 DIAGNOSIS — O99012 Anemia complicating pregnancy, second trimester: Secondary | ICD-10-CM

## 2024-03-03 DIAGNOSIS — E669 Obesity, unspecified: Secondary | ICD-10-CM

## 2024-03-03 DIAGNOSIS — O289 Unspecified abnormal findings on antenatal screening of mother: Secondary | ICD-10-CM

## 2024-03-03 DIAGNOSIS — Z148 Genetic carrier of other disease: Secondary | ICD-10-CM | POA: Diagnosis not present

## 2024-03-03 DIAGNOSIS — Z3A23 23 weeks gestation of pregnancy: Secondary | ICD-10-CM

## 2024-03-03 DIAGNOSIS — O99212 Obesity complicating pregnancy, second trimester: Secondary | ICD-10-CM | POA: Insufficient documentation

## 2024-03-03 DIAGNOSIS — Z362 Encounter for other antenatal screening follow-up: Secondary | ICD-10-CM | POA: Diagnosis not present

## 2024-03-03 DIAGNOSIS — Z3480 Encounter for supervision of other normal pregnancy, unspecified trimester: Secondary | ICD-10-CM

## 2024-03-03 NOTE — Progress Notes (Signed)
 "  Patient information  Patient Name: Bonnie Cobb  Patient MRN:   [id number]  Referring practice: MFM Referring Provider: [facility]  Problem List   Patient Active Problem List   Diagnosis Date Noted   Low fetal fraction on NIPS 02/04/2024   Obesity affecting pregnancy 01/15/2024   Prediabetes in mother during pregnancy 12/23/2023   Trichomonal vaginitis in pregnancy 12/23/2023   Supervision of other normal pregnancy, antepartum 11/20/2023   Mild intermittent asthma without complication 07/17/2023   Obesity due to excess calories without serious comorbidity 07/05/2022   Thrombocytosis 07/05/2022   Iron deficiency anemia due to chronic blood loss 07/05/2022   Prediabetes 07/05/2022   Elevated total protein 07/05/2022   Major depressive disorder, recurrent severe without psychotic features (HCC)    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 G5P1031 at [redacted]w[redacted]d here for ultrasound and consultation. Bonnie Cobb is doing well today with no acute concerns. Today we focused on the following:   The patient is here for a follow-up growth ultrasound due to incomplete visualization of fetal anatomy on a prior examination. Her pregnancy is complicated by maternal obesity with a pre-pregnancy BMI of 51 and a low fetal fraction on prior aneuploidy screening. She requests repeat aneuploidy screening today and declines diagnostic testing with amniocentesis after counseling. Todays evaluation focuses on growth and anatomy reassessment. She reports normal fetal movement.  We discussed the importance of ongoing fetal surveillance given maternal obesity and prior low fetal fraction, including close attention to fetal movement and planned third-trimester growth and antenatal testing.  RECOMMENDATIONS -Redraw aneuploidy screening today  per patient preference. -No diagnostic testing with amniocentesis per patient decision. -Continue daily fetal movement awareness and present for evaluation with any concerns. -Plan for serial growth ultrasounds in the third trimester. -Initiate antenatal testing beginning at [redacted] weeks gestation. -Continue routine prenatal care with attention to maternal obesity-related risks.  The patient had time to ask questions that were answered to her satisfaction.  She verbalized understanding and agrees to proceed with the plan above.   I spent 30 minutes reviewing the patients chart, including labs and images as well as counseling the patient about her medical conditions. Greater than 50% of the time was spent in direct face-to-face patient counseling.  Bonnie Cobb  MFM, [place]   03/03/2024  3:11 PM   Review of Systems: A review of systems was performed and was negative except per HPI   Vitals and Physical Exam    03/03/2024    2:32 PM 03/02/2024    1:34 PM 02/12/2024    3:31 PM  Vitals with BMI  Height  5' 3   Weight  294 lbs 2 oz   BMI  52.11   Systolic 126 132 880  Diastolic 74 78 78  Pulse 85 100 91    Sitting comfortably on the sonogram table Nonlabored breathing Normal rate and rhythm Abdomen is nontender  Past pregnancies OB History  Gravida Para Term Preterm AB Living  5 1 1  3 1   SAB IAB Ectopic Multiple Live Births  3    1    # Outcome Date GA Lbr Len/2nd Weight Sex Type Anes PTL Lv  5 Current           4 Term 08/04/19 110w1d 02:19 / 00:03 7 lb 14.8 oz (3.595 kg)  M Vag-Spont None  LIV  3 SAB 06/02/18 [redacted]w[redacted]d         2 SAB           1 SAB              Future Appointments  Date Time Provider Department Center  03/11/2024  1:50 PM Hoyle Garre, NP [facility] None  04/22/2024 10:40 AM McMichael, Nestor HERO, PA-C [facility]      "

## 2024-03-06 NOTE — Progress Notes (Signed)
 Agree with the assessment and plan as outlined by Boone Master, PA-C.

## 2024-03-09 ENCOUNTER — Ambulatory Visit: Payer: Self-pay

## 2024-03-09 LAB — PANORAMA PRENATAL TEST FULL PANEL:PANORAMA TEST PLUS 5 ADDITIONAL MICRODELETIONS: FETAL FRACTION: 7

## 2024-03-11 ENCOUNTER — Encounter: Admitting: Student

## 2024-03-18 ENCOUNTER — Encounter: Payer: Self-pay | Admitting: Physician Assistant

## 2024-03-18 ENCOUNTER — Ambulatory Visit: Admitting: Student

## 2024-03-18 ENCOUNTER — Other Ambulatory Visit: Payer: Self-pay | Admitting: Physician Assistant

## 2024-03-18 VITALS — BP 122/82 | HR 94 | Wt 290.8 lb

## 2024-03-18 DIAGNOSIS — R7309 Other abnormal glucose: Secondary | ICD-10-CM

## 2024-03-18 DIAGNOSIS — Z3A25 25 weeks gestation of pregnancy: Secondary | ICD-10-CM | POA: Diagnosis not present

## 2024-03-18 DIAGNOSIS — J45909 Unspecified asthma, uncomplicated: Secondary | ICD-10-CM | POA: Diagnosis not present

## 2024-03-18 DIAGNOSIS — Z3402 Encounter for supervision of normal first pregnancy, second trimester: Secondary | ICD-10-CM | POA: Diagnosis not present

## 2024-03-18 DIAGNOSIS — Z3480 Encounter for supervision of other normal pregnancy, unspecified trimester: Secondary | ICD-10-CM

## 2024-03-18 DIAGNOSIS — Z6841 Body Mass Index (BMI) 40.0 and over, adult: Secondary | ICD-10-CM

## 2024-03-18 NOTE — Progress Notes (Signed)
 Pt presents for rob. Pt complains of having headaches. No other questions or concerns at this time.

## 2024-03-19 ENCOUNTER — Other Ambulatory Visit: Payer: Self-pay

## 2024-03-20 NOTE — Progress Notes (Signed)
 "  PRENATAL VISIT NOTE  Subjective:  Bonnie Cobb is a 29 y.o. G5P1031 at [redacted]w[redacted]d being seen today for ongoing prenatal care.  She is currently monitored for the following issues for this low-risk pregnancy and has Alpha thalassemia silent carrier; Attention deficit hyperactivity disorder; Recurrent cold sores; Major depressive disorder, recurrent severe without psychotic features (HCC); Obesity due to excess calories without serious comorbidity; Thrombocytosis; Iron deficiency anemia due to chronic blood loss; Prediabetes; Elevated total protein; Supervision of other normal pregnancy, antepartum; Prediabetes in mother during pregnancy; Trichomonal vaginitis in pregnancy; Mild intermittent asthma without complication; Obesity affecting pregnancy; and Low fetal fraction on NIPS on their problem list.  Patient reports no complaints.  Contractions: Irritability. Vag. Bleeding: None.  Movement: Present. Denies leaking of fluid.   The following portions of the patient's history were reviewed and updated as appropriate: allergies, current medications, past family history, past medical history, past social history, past surgical history and problem list.   Objective:   Vitals:   03/18/24 1548  BP: 122/82  Pulse: 94  Weight: 290 lb 12.8 oz (131.9 kg)    Fetal Status:  Fetal Heart Rate (bpm): 151   Movement: Present    General: Alert, oriented and cooperative. Patient is in no acute distress.  Skin: Skin is warm and dry. No rash noted.   Cardiovascular: Normal heart rate noted  Respiratory: Normal respiratory effort, no problems with respiration noted  Abdomen: Soft, gravid, appropriate for gestational age.  Pain/Pressure: Present     Pelvic: Cervical exam deferred        Extremities: Normal range of motion.  Edema: Trace  Mental Status: Normal mood and affect. Normal behavior. Normal judgment and thought content.      12/18/2023    3:29 PM 11/20/2023    2:49 PM 07/05/2022    2:26 PM   Depression screen PHQ 2/9  Decreased Interest 0 0 0  Down, Depressed, Hopeless 0 0 0  PHQ - 2 Score 0 0 0  Altered sleeping 0 0 0  Tired, decreased energy 2 0 2  Change in appetite 2 0 2  Feeling bad or failure about yourself  0 0 0  Trouble concentrating 0 0 0  Moving slowly or fidgety/restless 0 0 0  Suicidal thoughts 0 0 0  PHQ-9 Score 4  0  4      Data saved with a previous flowsheet row definition        12/18/2023    3:33 PM 11/20/2023    2:50 PM 05/18/2020   12:36 AM 05/14/2019    9:40 AM  GAD 7 : Generalized Anxiety Score  Nervous, Anxious, on Edge 0 0 1 1  Control/stop worrying 0 0 2 2  Worry too much - different things 0 0 2 2  Trouble relaxing 0 0 2 2  Restless 0 0 3 3  Easily annoyed or irritable 0 0 1 1  Afraid - awful might happen 0 0 3 3  Total GAD 7 Score 0 0 14 14  Anxiety Difficulty   Somewhat difficult Not difficult at all    Assessment and Plan:  Pregnancy: G5P1031 at [redacted]w[redacted]d 1. Supervision of other normal pregnancy, antepartum (Primary) - Doing well overall - Reports frequent fetal movement  2. [redacted] weeks gestation of pregnancy - GTT at next visit  3. BMI 45.0-49.9, adult (HCC) - Continue bASA - follow-up growth scans scheduled  4. Elevated hemoglobin A1c - Early glucola WDL - Repeat GTT at next visit  5. Asthma, unspecified asthma severity, unspecified whether complicated, unspecified whether persistent - PRN nebulizer treatment  Preterm labor symptoms and general obstetric precautions including but not limited to vaginal bleeding, contractions, leaking of fluid and fetal movement were reviewed in detail with the patient. Please refer to After Visit Summary for other counseling recommendations.   Return in about 3 weeks (around 04/08/2024) for LOB/GTT, IN-PERSON.  Future Appointments  Date Time Provider Department Center  04/03/2024  8:15 AM [facility] None  04/03/2024  9:35 AM Nicholaus Jorene BRAVO, PA-C [facility] None  04/07/2024  2:15 PM  [facility] PROVIDER 1 [facility]  04/07/2024  2:30 PM [facility] US3 [facility]  04/22/2024 10:40 AM McMichael, Nestor HERO, PA-C [facility]    Nat Dauer, NP  "

## 2024-03-23 ENCOUNTER — Other Ambulatory Visit: Payer: Self-pay

## 2024-03-23 ENCOUNTER — Telehealth: Payer: Self-pay

## 2024-03-23 DIAGNOSIS — J45909 Unspecified asthma, uncomplicated: Secondary | ICD-10-CM

## 2024-03-23 NOTE — Telephone Encounter (Signed)
 Spoke with patient and informed patient a verbal order was given to [employer] for the at home nebulizer machine. Patient voiced understanding.

## 2024-03-23 NOTE — Telephone Encounter (Signed)
 Spoke with pt and let pt know Rx has been resent. Attempted to contact pharmacy for confirmation - line rings busy. Email sent to pharmacy asking for verification.

## 2024-03-23 NOTE — Telephone Encounter (Signed)
 Spoke with Steffan as [employer] and verbal order for at home nebulizer machine given due to pharmacy stating electronic prescription has not been received.

## 2024-03-23 NOTE — Progress Notes (Signed)
 DME Nebulizer machine sent per Jorene Moats, PA-C last visit note.  Rx sent to [facility]

## 2024-03-27 ENCOUNTER — Inpatient Hospital Stay (HOSPITAL_COMMUNITY)
Admission: AD | Admit: 2024-03-27 | Discharge: 2024-03-28 | Disposition: A | Discharge status: Home / Self Care | Attending: Obstetrics and Gynecology | Admitting: Obstetrics and Gynecology

## 2024-03-27 ENCOUNTER — Encounter (HOSPITAL_COMMUNITY): Payer: Self-pay | Admitting: Obstetrics and Gynecology

## 2024-03-27 ENCOUNTER — Encounter: Payer: Self-pay | Admitting: Obstetrics and Gynecology

## 2024-03-27 DIAGNOSIS — B9689 Other specified bacterial agents as the cause of diseases classified elsewhere: Secondary | ICD-10-CM | POA: Diagnosis not present

## 2024-03-27 DIAGNOSIS — O219 Vomiting of pregnancy, unspecified: Secondary | ICD-10-CM | POA: Insufficient documentation

## 2024-03-27 DIAGNOSIS — O23592 Infection of other part of genital tract in pregnancy, second trimester: Secondary | ICD-10-CM | POA: Diagnosis not present

## 2024-03-27 DIAGNOSIS — Z3480 Encounter for supervision of other normal pregnancy, unspecified trimester: Secondary | ICD-10-CM

## 2024-03-27 DIAGNOSIS — Z6841 Body Mass Index (BMI) 40.0 and over, adult: Secondary | ICD-10-CM

## 2024-03-27 DIAGNOSIS — D509 Iron deficiency anemia, unspecified: Secondary | ICD-10-CM | POA: Insufficient documentation

## 2024-03-27 DIAGNOSIS — O99012 Anemia complicating pregnancy, second trimester: Secondary | ICD-10-CM | POA: Insufficient documentation

## 2024-03-27 DIAGNOSIS — Z3A27 27 weeks gestation of pregnancy: Secondary | ICD-10-CM | POA: Diagnosis not present

## 2024-03-27 DIAGNOSIS — O99212 Obesity complicating pregnancy, second trimester: Secondary | ICD-10-CM

## 2024-03-27 DIAGNOSIS — N898 Other specified noninflammatory disorders of vagina: Secondary | ICD-10-CM | POA: Diagnosis present

## 2024-03-27 LAB — URINALYSIS, ROUTINE W REFLEX MICROSCOPIC
Bilirubin Urine: NEGATIVE
Glucose, UA: NEGATIVE mg/dL
Hgb urine dipstick: NEGATIVE
Ketones, ur: NEGATIVE mg/dL
Nitrite: NEGATIVE
Protein, ur: NEGATIVE mg/dL
Specific Gravity, Urine: 1.017 (ref 1.005–1.030)
pH: 6 (ref 5.0–8.0)

## 2024-03-27 LAB — CBC
HCT: 31.5 % — ABNORMAL LOW (ref 36.0–46.0)
Hemoglobin: 10 g/dL — ABNORMAL LOW (ref 12.0–15.0)
MCH: 23.5 pg — ABNORMAL LOW (ref 26.0–34.0)
MCHC: 31.7 g/dL (ref 30.0–36.0)
MCV: 73.9 fL — ABNORMAL LOW (ref 80.0–100.0)
Platelets: 370 K/uL (ref 150–400)
RBC: 4.26 MIL/uL (ref 3.87–5.11)
RDW: 14.8 % (ref 11.5–15.5)
WBC: 8.3 K/uL (ref 4.0–10.5)
nRBC: 0 % (ref 0.0–0.2)

## 2024-03-27 LAB — WET PREP, GENITAL
Sperm: NONE SEEN
Trich, Wet Prep: NONE SEEN
WBC, Wet Prep HPF POC: >=10 — AB
Yeast Wet Prep HPF POC: NONE SEEN

## 2024-03-27 MED ORDER — FAMOTIDINE IN NACL 20-0.9 MG/50ML-% IV SOLN
20.0000 mg | Freq: Once | INTRAVENOUS | Status: AC
  Administered 2024-03-27: 20 mg via INTRAVENOUS
  Filled 2024-03-27: qty 50

## 2024-03-27 MED ORDER — ONDANSETRON HCL 4 MG/2ML IJ SOLN
4.0000 mg | Freq: Once | INTRAMUSCULAR | Status: AC
  Administered 2024-03-27: 4 mg via INTRAVENOUS
  Filled 2024-03-27: qty 2

## 2024-03-27 MED ORDER — LACTATED RINGERS IV BOLUS
1000.0000 mL | Freq: Once | INTRAVENOUS | Status: AC
  Administered 2024-03-27: 1000 mL via INTRAVENOUS

## 2024-03-27 NOTE — MAU Provider Note (Signed)
 Chief Complaint:  No chief complaint on file. Patient reports fluid coming out of her vagina for 2 weeks when she coughs  HPI     Bonnie Cobb is a 29 y.o. [phone] at [redacted]w[redacted]d who presents to maternity admissions reporting that she has had some vaginal fluid, coming out of her vagina for the past 2 weeks especially when she coughs.  She reports some uterine contractions but denies any vaginal bleeding and reports that she last felt baby move yesterday although she is feeling baby move here in triage.  Patient also has had episodes of nausea and vomiting today.  She last vomited after eating egg and bacon and cheese sandwich.  She states she is able to tolerate small frequent meals throughout the day with fruits but has been nauseous since this morning sandwich..   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 No MFM consult needed. Just needs Type and Crossmatch in labor (in case of PPH etc).   Maternal gonorrhea in second trimester 04/02/2019   NSVD (normal spontaneous vaginal delivery)    Prediabetes in mother during pregnancy    PTSD (post-traumatic stress disorder)    OB History  Gravida Para Term Preterm AB Living  5 1 1  3 1   SAB IAB Ectopic Multiple Live Births  3    1    # Outcome Date GA Lbr Len/2nd Weight Sex Type Anes PTL Lv  5 Current           4 Term 08/04/19 [redacted]w[redacted]d 02:19 / 00:03 3595 g M Vag-Spont None  LIV  3 SAB 06/02/18 [redacted]w[redacted]d         2 SAB           1 SAB            Past Surgical History:  Procedure Laterality Date   NO PAST SURGERIES     Family History  Problem Relation Age of Onset   COPD Mother     Asthma Mother    Asthma Father    Social History[1] Allergies[2] Medications Prior to Admission  Medication Sig Dispense Refill Last Dose/Taking   albuterol  (ACCUNEB ) 1.25 MG/3ML nebulizer solution Take 3 mLs (1.25 mg total) by nebulization every 6 (six) hours as needed for wheezing. 75 mL 12 Past Week   albuterol  (VENTOLIN  HFA) 108 (90 Base) MCG/ACT inhaler Inhale 1-2 puffs into the lungs every 6 (six) hours as needed for wheezing or shortness of breath. 18 g 1    aspirin  EC 81 MG tablet Take 1 tablet (81 mg total) by mouth daily. Start taking when you are [redacted] weeks pregnant for rest of pregnancy for prevention of preeclampsia (Patient not taking: Reported on 03/18/2024) 300 tablet 2    Blood Pressure Monitoring (BLOOD PRESSURE KIT) DEVI 1 Device by Does not apply route once a week. 1 each 0    buPROPion (ZYBAN) 150 MG 12 hr tablet Take 150 mg by mouth daily. (Patient not taking: Reported on 03/18/2024)      Doxylamine -Pyridoxine  (DICLEGIS ) 10-10 MG TBEC Take 2 tablets by mouth at bedtime. If symptoms persist, add one tablet in the morning and one  in the afternoon (Patient not taking: Reported on 03/18/2024) 100 tablet 5    famotidine  (PEPCID ) 20 MG tablet Take 1 tablet (20 mg total) by mouth 2 (two) times daily. (Patient not taking: Reported on 03/18/2024) 60 tablet 3    guanFACINE (TENEX) 2 MG tablet Take 2 mg by mouth at bedtime. (Patient not taking: Reported on 03/18/2024)      hydrOXYzine (ATARAX) 10 MG tablet Take 10 mg by mouth 2 (two) times daily as needed for anxiety. (Patient not taking: Reported on 03/18/2024)      omeprazole  (PRILOSEC) 20 MG capsule Take 1 capsule (20 mg total) by mouth daily. 30 capsule 4 More than a month   Prenatal Vit-Fe Phos-FA-Omega (VITAFOL  GUMMIES) 3.33-0.333-34.8 MG CHEW Chew 3 tablets by mouth daily. (Patient not taking: Reported on 03/18/2024) 90 tablet 5    promethazine  (PHENERGAN ) 25 MG tablet Take 1 tablet (25 mg total) by mouth every 6 (six) hours as needed. Can  insert vaginally, if unable to keep anything down (Patient not taking: Reported on 03/18/2024) 30 tablet 0     I have reviewed patient's Past Medical Hx, Surgical Hx, Family Hx, Social Hx, medications and allergies.   ROS  Pertinent items noted in HPI and remainder of comprehensive ROS otherwise negative.   PHYSICAL EXAM  Patient Vitals for the past 24 hrs:  BP Temp Temp src Pulse Resp Height Weight  03/27/24 2109 (!) 107/57 98.6 F (37 C) Oral 96 12 5' 3 (1.6 m) 133.9 kg  03/27/24 2107 -- 98.6 F (37 C) -- -- -- -- --    Constitutional: Well-developed, obese female in no acute distress.  Cardiovascular: normal rate & rhythm, warm and well-perfused Respiratory: normal effort, no problems with respiration noted GI: Abd soft, non-tender, gravid, no ruq pain illlicited MS: Extremities nontender, no edema, normal ROM Neurologic: Alert and oriented x 4.  GU: no CVA tenderness Pelvic: deferred     Fetal Tracing: NST REACTIVE FOR GA Baseline: 135 Variability: moderate Accelerations: 10 x10 Decelerations: absent Toco: none   Labs: Results for orders placed or performed during the hospital encounter of 03/27/24 (from the past 24 hours)  CBC     Status: Abnormal   Collection Time: 03/27/24 10:05 PM  Result Value Ref Range   WBC 8.3 4.0 - 10.5 K/uL   RBC 4.26 3.87 - 5.11 MIL/uL   Hemoglobin 10.0 (L) 12.0 - 15.0 g/dL   HCT 68.4 (L) 63.9 - 53.9 %   MCV 73.9 (L) 80.0 - 100.0 fL   MCH 23.5 (L) 26.0 - 34.0 pg   MCHC 31.7 30.0 - 36.0 g/dL   RDW 85.1 88.4 - 84.4 %   Platelets 370 150 - 400 K/uL   nRBC 0.0 0.0 - 0.2 %  Wet prep, genital     Status: Abnormal   Collection Time: 03/27/24 10:33 PM  Result Value Ref Range   Yeast Wet Prep HPF POC NONE SEEN NONE SEEN   Trich, Wet Prep NONE SEEN NONE SEEN   Clue Cells Wet Prep HPF POC PRESENT (A) NONE SEEN   WBC, Wet Prep HPF POC >=10 (A) <10   Sperm NONE SEEN   Urinalysis, Routine w reflex microscopic -Urine, Random     Status:  Abnormal   Collection Time: 03/27/24 10:38 PM  Result Value Ref Range   Color, Urine YELLOW YELLOW   APPearance CLOUDY (A) CLEAR   Specific Gravity, Urine 1.017 1.005 - 1.030   pH 6.0 5.0 - 8.0  Glucose, UA NEGATIVE NEGATIVE mg/dL   Hgb urine dipstick NEGATIVE NEGATIVE   Bilirubin Urine NEGATIVE NEGATIVE   Ketones, ur NEGATIVE NEGATIVE mg/dL   Protein, ur NEGATIVE NEGATIVE mg/dL   Nitrite NEGATIVE NEGATIVE   Leukocytes,Ua SMALL (A) NEGATIVE   RBC / HPF 0-5 0 - 5 RBC/hpf   WBC, UA 0-5 0 - 5 WBC/hpf   Bacteria, UA FEW (A) NONE SEEN   Squamous Epithelial / HPF 6-10 0 - 5 /HPF   Mucus PRESENT   Comprehensive metabolic panel with GFR     Status: Abnormal   Collection Time: 03/28/24 12:07 AM  Result Value Ref Range   Sodium 135 135 - 145 mmol/L   Potassium 3.9 3.5 - 5.1 mmol/L   Chloride 102 98 - 111 mmol/L   CO2 22 22 - 32 mmol/L   Glucose, Bld 91 70 - 99 mg/dL   BUN 5 (L) 6 - 20 mg/dL   Creatinine, Ser 9.46 0.44 - 1.00 mg/dL   Calcium  9.3 8.9 - 10.3 mg/dL   Total Protein 7.3 6.5 - 8.1 g/dL   Albumin 3.6 3.5 - 5.0 g/dL   AST 16 15 - 41 U/L   ALT 11 0 - 44 U/L   Alkaline Phosphatase 67 38 - 126 U/L   Total Bilirubin <0.2 0.0 - 1.2 mg/dL   GFR, Estimated >39 >39 mL/min   Anion gap 10 5 - 15  Magnesium     Status: None   Collection Time: 03/28/24 12:07 AM  Result Value Ref Range   Magnesium 1.9 1.7 - 2.4 mg/dL    Imaging:  No results found.  MDM & MAU COURSE  MDM:  HIGH  N/V in pregnancy at [redacted] wks GA  CBC: Hemoglobin at 10.0-plan to start on p.o. oral supplement and increase dietary iron CMP Serum magnesium  UA Vaginal cultures IVF: LR bolus administered Antiemetics: IV Zofran  and Pepcid  administered PO Challenge tolerated  Nst for GA and fetal reassurance ( Reactive)    Differential diagnosis with nausea/vomiting includes but is not limited to: nausea due to pregnancy, hyperemesis gravidarum, viral infection, gastroenteritis, cholecystitis, pancreatitis,  ACS, food poisoning, diabetic ketoacidosis, drug ingestion, gastroparesis, alcohol/opiate withdrawal  MAU Course: Orders Placed This Encounter  Procedures   Wet prep, genital   CBC   Urinalysis, Routine w reflex microscopic -Urine, Random   Comprehensive metabolic panel with GFR   Magnesium   Discharge patient Discharge disposition: 01-Home or Self Care; Discharge patient date: 03/28/2024   Meds ordered this encounter  Medications   lactated ringers  bolus 1,000 mL   ondansetron  (ZOFRAN ) injection 4 mg   famotidine  (PEPCID ) IVPB 20 mg premix   ondansetron  (ZOFRAN -ODT) 4 MG disintegrating tablet    Sig: Take 2 tablets (8 mg total) by mouth 2 (two) times daily as needed for nausea or vomiting.    Dispense:  20 tablet    Refill:  0    Supervising Provider:   PRATT, TANYA S [[id number]]   metroNIDAZOLE  (FLAGYL ) 500 MG tablet    Sig: Take 1 tablet (500 mg total) by mouth 2 (two) times daily.    Dispense:  14 tablet    Refill:  0    Supervising Provider:   PRATT, TANYA S [[id number]]   Ferric Maltol  30 MG CAPS    Sig: Take 1 capsule (30 mg total) by mouth 2 (two) times daily. Please take one hour before breakfast and dinner    Dispense:  60 capsule    Refill:  2  Supervising Provider:   PRATT, TANYA S [[id number]]    I have reviewed the patient chart and performed the physical exam . I have ordered & interpreted the lab results and reviewed and interpreted the NST    ASSESSMENT   1. Nausea and vomiting during pregnancy   2. Bacterial vaginosis   3. BMI 50.0-59.9, adult (HCC)   4. Iron deficiency anemia, unspecified iron deficiency anemia type     PLAN   Future Appointments  Date Time Provider Department Center  04/03/2024  8:15 AM [facility] None  04/03/2024  9:35 AM Davis, Devon E, PA-C [facility] None  04/07/2024  2:15 PM [facility] PROVIDER 1 [facility]  04/07/2024  2:30 PM [facility] US3 [facility]  04/22/2024 10:40 AM McMichael, Nestor HERO, PA-C [facility]    Prescription sent  for Flagyl  500 mg twice daily x 7 days  Antiemetics sent to patient's pharmacy of choice BRAT's diet encouraged  Pepcid  as needed   Patient is stable for discharge home with return precautions.   See AVS for full description of information given to the patient including both verbal and written. Patient verbalized understanding and agrees with the plan as described above.      Allergies as of 03/28/2024       Reactions   Bee Venom Anaphylaxis   Mushroom    Other    Carmex lip balm, mushrooms   Pineapple Swelling   Tongue Swelling, fresh pineapple only   Poison Ivy Extract Other (See Comments)        Medication List     TAKE these medications    albuterol  108 (90 Base) MCG/ACT inhaler Commonly known as: VENTOLIN  HFA Inhale 1-2 puffs into the lungs every 6 (six) hours as needed for wheezing or shortness of breath.   albuterol  1.25 MG/3ML nebulizer solution Commonly known as: ACCUNEB  Take 3 mLs (1.25 mg total) by nebulization every 6 (six) hours as needed for wheezing.   aspirin  EC 81 MG tablet Take 1 tablet (81 mg total) by mouth daily. Start taking when you are [redacted] weeks pregnant for rest of pregnancy for prevention of preeclampsia   Blood Pressure Kit Devi 1 Device by Does not apply route once a week.   buPROPion 150 MG 12 hr tablet Commonly known as: ZYBAN Take 150 mg by mouth daily.   Doxylamine -Pyridoxine  10-10 MG Tbec Commonly known as: Diclegis  Take 2 tablets by mouth at bedtime. If symptoms persist, add one tablet in the morning and one in the afternoon   famotidine  20 MG tablet Commonly known as: PEPCID  Take 1 tablet (20 mg total) by mouth 2 (two) times daily.   Ferric Maltol  30 MG Caps Take 1 capsule (30 mg total) by mouth 2 (two) times daily. Please take one hour before breakfast and dinner   guanFACINE 2 MG tablet Commonly known as: TENEX Take 2 mg by mouth at bedtime.   hydrOXYzine 10 MG tablet Commonly known as: ATARAX Take 10 mg by mouth 2  (two) times daily as needed for anxiety.   metroNIDAZOLE  500 MG tablet Commonly known as: FLAGYL  Take 1 tablet (500 mg total) by mouth 2 (two) times daily.   omeprazole  20 MG capsule Commonly known as: PRILOSEC Take 1 capsule (20 mg total) by mouth daily.   ondansetron  4 MG disintegrating tablet Commonly known as: ZOFRAN -ODT Take 2 tablets (8 mg total) by mouth 2 (two) times daily as needed for nausea or vomiting.   promethazine  25 MG tablet Commonly known  as: PHENERGAN  Take 1 tablet (25 mg total) by mouth every 6 (six) hours as needed. Can insert vaginally, if unable to keep anything down   Vitafol  Gummies 3.33-0.333-34.8 MG Chew Chew 3 tablets by mouth daily.        Olam Dalton, MSN, WHNP-BC [facility] [employer]       [1]  Social History Tobacco Use   Smoking status: Former    Types: Cigars   Smokeless tobacco: Never   Tobacco comments:    Rare vape  Vaping Use   Vaping status: Former   Substances: Flavoring  Substance Use Topics   Alcohol use: No   Drug use: Not Currently    Types: Marijuana    Comment: once a month  [2]  Allergies Allergen Reactions   Bee Venom Anaphylaxis   Mushroom    Other     Carmex lip balm, mushrooms   Pineapple Swelling    Tongue Swelling, fresh pineapple only   Poison Ivy Extract Other (See Comments)

## 2024-03-27 NOTE — MAU Note (Signed)
 Pt says fluid comes out of her vagina x2 weeks - when she coughs.  No VB PNC- [facility]  Feels some UC's . Last time baby moved was yesterday afternoon- [facility]- in Triage - 153.  She called nurse line  - about getting meds refilled- told her to come here. Then pt fixed a sandwich - she vomited at 830 and at reg . SABRA  Some nausea now .

## 2024-03-28 DIAGNOSIS — Z6841 Body Mass Index (BMI) 40.0 and over, adult: Secondary | ICD-10-CM

## 2024-03-28 DIAGNOSIS — O219 Vomiting of pregnancy, unspecified: Secondary | ICD-10-CM | POA: Diagnosis not present

## 2024-03-28 DIAGNOSIS — B9689 Other specified bacterial agents as the cause of diseases classified elsewhere: Secondary | ICD-10-CM

## 2024-03-28 DIAGNOSIS — N760 Acute vaginitis: Secondary | ICD-10-CM

## 2024-03-28 DIAGNOSIS — D500 Iron deficiency anemia secondary to blood loss (chronic): Secondary | ICD-10-CM

## 2024-03-28 DIAGNOSIS — Z3A27 27 weeks gestation of pregnancy: Secondary | ICD-10-CM

## 2024-03-28 LAB — COMPREHENSIVE METABOLIC PANEL WITH GFR
ALT: 11 U/L (ref 0–44)
AST: 16 U/L (ref 15–41)
Albumin: 3.6 g/dL (ref 3.5–5.0)
Alkaline Phosphatase: 67 U/L (ref 38–126)
Anion gap: 10 (ref 5–15)
BUN: 5 mg/dL — ABNORMAL LOW (ref 6–20)
CO2: 22 mmol/L (ref 22–32)
Calcium: 9.3 mg/dL (ref 8.9–10.3)
Chloride: 102 mmol/L (ref 98–111)
Creatinine, Ser: 0.53 mg/dL (ref 0.44–1.00)
GFR, Estimated: >60 mL/min
Glucose, Bld: 91 mg/dL (ref 70–99)
Potassium: 3.9 mmol/L (ref 3.5–5.1)
Sodium: 135 mmol/L (ref 135–145)
Total Bilirubin: <0.2 mg/dL (ref 0.0–1.2)
Total Protein: 7.3 g/dL (ref 6.5–8.1)

## 2024-03-28 LAB — MAGNESIUM: Magnesium: 1.9 mg/dL (ref 1.7–2.4)

## 2024-03-28 MED ORDER — FERRIC MALTOL 30 MG PO CAPS: 1.0000 | ORAL_CAPSULE | Freq: Two times a day (BID) | ORAL | 2 refills | Status: DC

## 2024-03-28 MED ORDER — METRONIDAZOLE 500 MG PO TABS: 500.0000 mg | ORAL_TABLET | Freq: Two times a day (BID) | ORAL | 0 refills | Status: AC

## 2024-03-28 MED ORDER — ONDANSETRON 4 MG PO TBDP: 8.0000 mg | ORAL_TABLET | Freq: Two times a day (BID) | ORAL | 0 refills | Status: AC | PRN

## 2024-03-30 LAB — GC/CHLAMYDIA PROBE AMP (~~LOC~~) NOT AT ARMC
Chlamydia: NEGATIVE
Comment: NEGATIVE
Comment: NORMAL
Neisseria Gonorrhea: NEGATIVE

## 2024-04-03 ENCOUNTER — Ambulatory Visit: Payer: Self-pay | Admitting: Physician Assistant

## 2024-04-03 ENCOUNTER — Other Ambulatory Visit

## 2024-04-03 ENCOUNTER — Encounter: Payer: Self-pay | Admitting: Physician Assistant

## 2024-04-03 VITALS — BP 136/74 | HR 97 | Wt 297.2 lb

## 2024-04-03 DIAGNOSIS — O99343 Other mental disorders complicating pregnancy, third trimester: Secondary | ICD-10-CM

## 2024-04-03 DIAGNOSIS — Z6841 Body Mass Index (BMI) 40.0 and over, adult: Secondary | ICD-10-CM

## 2024-04-03 DIAGNOSIS — O23593 Infection of other part of genital tract in pregnancy, third trimester: Secondary | ICD-10-CM

## 2024-04-03 DIAGNOSIS — Z3A28 28 weeks gestation of pregnancy: Secondary | ICD-10-CM

## 2024-04-03 DIAGNOSIS — J4520 Mild intermittent asthma, uncomplicated: Secondary | ICD-10-CM

## 2024-04-03 DIAGNOSIS — F332 Major depressive disorder, recurrent severe without psychotic features: Secondary | ICD-10-CM

## 2024-04-03 DIAGNOSIS — Z3480 Encounter for supervision of other normal pregnancy, unspecified trimester: Secondary | ICD-10-CM

## 2024-04-03 DIAGNOSIS — A5901 Trichomonal vulvovaginitis: Secondary | ICD-10-CM

## 2024-04-03 MED ORDER — VITAFOL GUMMIES 3.33-0.333-34.8 MG PO CHEW: 3.0000 | CHEWABLE_TABLET | Freq: Every day | ORAL | 5 refills | Status: AC

## 2024-04-03 NOTE — Progress Notes (Addendum)
 "  PRENATAL VISIT NOTE  Subjective:  Bonnie Cobb is a 29 y.o. G5P1031 at [redacted]w[redacted]d being seen today for ongoing prenatal care.  She is currently monitored for the following issues for this high-risk pregnancy and has Alpha thalassemia silent carrier; Attention deficit hyperactivity disorder; Recurrent cold sores; Major depressive disorder, recurrent severe without psychotic features (HCC); Obesity due to excess calories without serious comorbidity; Iron deficiency anemia due to chronic blood loss; Prediabetes; Elevated total protein; Supervision of other normal pregnancy, antepartum; Prediabetes in mother during pregnancy; Trichomonal vaginitis in pregnancy; Mild intermittent asthma without complication; Obesity affecting pregnancy; and Low fetal fraction on NIPS on their problem list.  Patient reports no complaints.  Contractions: Not present. Vag. Bleeding: None.  Movement: Present. Denies leaking of fluid.   The following portions of the patient's history were reviewed and updated as appropriate: allergies, current medications, past family history, past medical history, past social history, past surgical history and problem list.   Objective:   Vitals:   04/03/24 0857  BP: 136/74  Pulse: 97  Weight: 297 lb 3.2 oz (134.8 kg)    Fetal Status:  Fetal Heart Rate (bpm): 147 Fundal Height: 28 cm Movement: Present    General: Alert, oriented and cooperative. Patient is in no acute distress.  Skin: Skin is warm and dry. No rash noted.   Cardiovascular: Normal heart rate noted  Respiratory: Normal respiratory effort, no problems with respiration noted  Abdomen: Soft, gravid, appropriate for gestational age.  Pain/Pressure: Absent     Pelvic: Cervical exam deferred        Extremities: Normal range of motion.  Edema: Trace  Mental Status: Normal mood and affect. Normal behavior. Normal judgment and thought content.      04/03/2024    9:08 AM 12/18/2023    3:29 PM 11/20/2023    2:49 PM   Depression screen PHQ 2/9  Decreased Interest 2 0 0  Down, Depressed, Hopeless 0 0 0  PHQ - 2 Score 2 0 0  Altered sleeping 0 0 0  Tired, decreased energy 2 2 0  Change in appetite 0 2 0  Feeling bad or failure about yourself  0 0 0  Trouble concentrating 0 0 0  Moving slowly or fidgety/restless 0 0 0  Suicidal thoughts 0 0 0  PHQ-9 Score 4 4  0      Data saved with a previous flowsheet row definition        04/03/2024    9:08 AM 12/18/2023    3:33 PM 11/20/2023    2:50 PM 05/18/2020   12:36 AM  GAD 7 : Generalized Anxiety Score  Nervous, Anxious, on Edge 0 0  0  1   Control/stop worrying 0 0  0  2   Worry too much - different things 2 0  0  2   Trouble relaxing 0 0  0  2   Restless 0 0  0  3   Easily annoyed or irritable 0 0  0  1   Afraid - awful might happen 0 0  0  3   Total GAD 7 Score 2 0 0 14  Anxiety Difficulty    Somewhat difficult     Data saved with a previous flowsheet row definition    Assessment and Plan:  Pregnancy: G5P1031 at [redacted]w[redacted]d  1. Supervision of other normal pregnancy, antepartum (Primary) Patient doing well, feeling regular fetal movement BP, FHR, FH appropriate   2. [redacted] weeks gestation of  pregnancy Anticipatory guidance about next visits/weeks of pregnancy given.   3. Trichomonal vaginitis during pregnancy, antepartum TOC negative  4. Mild intermittent asthma without complication Albuterol  inhaler using once monthly  5. BMI 45.0-49.9, adult (HCC) Continue ASA Growths and antenatal testing   6. Major depressive disorder, recurrent severe without psychotic features (HCC)   Preterm labor symptoms and general obstetric precautions including but not limited to vaginal bleeding, contractions, leaking of fluid and fetal movement were reviewed in detail with the patient.  Please refer to After Visit Summary for other counseling recommendations.   Return in about 2 weeks (around 04/17/2024) for [facility].  Future Appointments  Date Time Provider  Department Center  04/07/2024  2:15 PM [facility] PROVIDER 1 [facility]  04/07/2024  2:30 PM [facility] US3 [facility]  04/22/2024 10:40 AM McMichael, Nestor HERO, PA-C [facility]    Bonnie FORBES Moats, PA-C  "

## 2024-04-03 NOTE — Progress Notes (Signed)
 Pt presents for ROB visit. Pt was seen at MAU 03-27-24. Pt states issues have resolved since that visit. US  scheduled 04-07-24  Wants to defer Tdap until 32 weeks to get it with RSV vaccine

## 2024-04-04 LAB — CBC
Hematocrit: 28.5 % — ABNORMAL LOW (ref 34.0–46.6)
Hemoglobin: 9.4 g/dL — ABNORMAL LOW (ref 11.1–15.9)
MCH: 24 pg — ABNORMAL LOW (ref 26.6–33.0)
MCHC: 33 g/dL (ref 31.5–35.7)
MCV: 73 fL — ABNORMAL LOW (ref 79–97)
Platelets: 341 10*3/uL (ref 150–450)
RBC: 3.92 x10E6/uL (ref 3.77–5.28)
RDW: 14.6 % (ref 11.7–15.4)
WBC: 7.8 10*3/uL (ref 3.4–10.8)

## 2024-04-04 LAB — GLUCOSE TOLERANCE, 2 HOURS W/ 1HR
Glucose, 1 hour: 128 mg/dL (ref 70–179)
Glucose, 2 hour: 117 mg/dL (ref 70–152)
Glucose, Fasting: 79 mg/dL (ref 70–91)

## 2024-04-04 LAB — SYPHILIS: RPR W/REFLEX TO RPR TITER AND TREPONEMAL ANTIBODIES, TRADITIONAL SCREENING AND DIAGNOSIS ALGORITHM: RPR Ser Ql: NONREACTIVE

## 2024-04-04 LAB — HIV ANTIBODY (ROUTINE TESTING W REFLEX): HIV Screen 4th Generation wRfx: NONREACTIVE

## 2024-04-06 ENCOUNTER — Encounter (HOSPITAL_COMMUNITY): Payer: Self-pay

## 2024-04-06 ENCOUNTER — Other Ambulatory Visit: Payer: Self-pay

## 2024-04-06 ENCOUNTER — Ambulatory Visit: Payer: Self-pay | Admitting: Physician Assistant

## 2024-04-06 ENCOUNTER — Inpatient Hospital Stay (HOSPITAL_COMMUNITY)
Admission: AD | Admit: 2024-04-06 | Discharge: 2024-04-06 | Disposition: A | Discharge status: Home / Self Care | Attending: Emergency Medicine | Admitting: Emergency Medicine

## 2024-04-06 ENCOUNTER — Emergency Department (HOSPITAL_COMMUNITY)

## 2024-04-06 DIAGNOSIS — R202 Paresthesia of skin: Secondary | ICD-10-CM | POA: Diagnosis not present

## 2024-04-06 DIAGNOSIS — R109 Unspecified abdominal pain: Secondary | ICD-10-CM

## 2024-04-06 DIAGNOSIS — O26893 Other specified pregnancy related conditions, third trimester: Secondary | ICD-10-CM | POA: Diagnosis not present

## 2024-04-06 DIAGNOSIS — O9A213 Injury, poisoning and certain other consequences of external causes complicating pregnancy, third trimester: Secondary | ICD-10-CM | POA: Diagnosis present

## 2024-04-06 DIAGNOSIS — R1011 Right upper quadrant pain: Secondary | ICD-10-CM | POA: Insufficient documentation

## 2024-04-06 DIAGNOSIS — X58XXXA Exposure to other specified factors, initial encounter: Secondary | ICD-10-CM | POA: Diagnosis not present

## 2024-04-06 DIAGNOSIS — S46012A Strain of muscle(s) and tendon(s) of the rotator cuff of left shoulder, initial encounter: Secondary | ICD-10-CM | POA: Diagnosis not present

## 2024-04-06 DIAGNOSIS — M25512 Pain in left shoulder: Secondary | ICD-10-CM

## 2024-04-06 DIAGNOSIS — M6520 Calcific tendinitis, unspecified site: Secondary | ICD-10-CM

## 2024-04-06 DIAGNOSIS — Z3A28 28 weeks gestation of pregnancy: Secondary | ICD-10-CM

## 2024-04-06 DIAGNOSIS — O99891 Other specified diseases and conditions complicating pregnancy: Secondary | ICD-10-CM

## 2024-04-06 LAB — URINALYSIS, ROUTINE W REFLEX MICROSCOPIC
Bilirubin Urine: NEGATIVE
Glucose, UA: NEGATIVE mg/dL
Hgb urine dipstick: NEGATIVE
Ketones, ur: NEGATIVE mg/dL
Nitrite: NEGATIVE
Protein, ur: NEGATIVE mg/dL
Specific Gravity, Urine: 1.014 (ref 1.005–1.030)
pH: 6 (ref 5.0–8.0)

## 2024-04-06 MED ORDER — MORPHINE SULFATE (PF) 4 MG/ML IV SOLN
4.0000 mg | Freq: Once | INTRAVENOUS | Status: AC
  Administered 2024-04-06: 4 mg via INTRAMUSCULAR
  Filled 2024-04-06: qty 1

## 2024-04-06 MED ORDER — LIDOCAINE 5 % EX PTCH
1.0000 | MEDICATED_PATCH | CUTANEOUS | Status: DC
  Administered 2024-04-06: 1 via TRANSDERMAL
  Filled 2024-04-06: qty 1

## 2024-04-06 MED ORDER — ACETAMINOPHEN 500 MG PO TABS
1000.0000 mg | ORAL_TABLET | Freq: Once | ORAL | Status: AC
  Administered 2024-04-06: 1000 mg via ORAL
  Filled 2024-04-06: qty 2

## 2024-04-06 NOTE — Discharge Instructions (Signed)
 [facility]   Address: [address], [place], [place] [address] Hours:  Closed  Opens 8?AM Tue Phone: [phone] Appointments: [email or web address]

## 2024-04-06 NOTE — MAU Note (Signed)
 Bonnie Cobb is a 29 y.o. at [redacted]w[redacted]d here in MAU reporting: here by carelink from [facility] c/o left shoulder pain that feels like someone is stabbing her - constant. Reports pain started Saturday but worsened Sunday evening. Having occasional pelvic pressure. Denies VB or LOF. +FM. Had a punch feeling in her back earlier, but not currently.    Pain score: 10 - shoulder; 3 - pelvic  Vitals:   04/06/24 0147 04/06/24 0313  BP: 120/68 121/65  Pulse: 96 94  Resp: 18 18  Temp: 98.3 F (36.8 C) 98.3 F (36.8 C)  SpO2: 100% 98%     FHT: 148  Lab orders placed from triage: none

## 2024-04-06 NOTE — ED Triage Notes (Signed)
 Pt. Arrives via [facility] for left shoulder pain and numbness that started at 3:30 pm. Pt.also having RUQ abdominal pain and low back pain. Pt. Is [redacted] weeks pregnant. This is her 5th pregnancy. 1 living child.

## 2024-04-06 NOTE — Progress Notes (Signed)
 Orthopedic Tech Progress Note Patient Details:  Bonnie Cobb 1995-10-31 [id number]  Ortho Devices Type of Ortho Device: Arm sling Ortho Device/Splint Location: lue Ortho Device/Splint Interventions: Ordered, Adjustment, Application   Post Interventions Patient Tolerated: Well Instructions Provided: Adjustment of device, Care of device  Chandra Dorn PARAS 04/06/2024, 4:15 AM

## 2024-04-06 NOTE — MAU Note (Signed)
 04/06/2024  Bonnie Cobb DOB: 1995-05-14 MRN: [id number]   RIDER WAIVER AND RELEASE OF LIABILITY  For the purposes of helping with transportation needs, [facility] partners with outside transportation providers (taxi companies, [place], catering manager.) to give [facility] patients or other approved people the choice of on-demand rides Public Librarian) to our buildings for non-emergency visits.  By using [employer], I, the person signing this document, on behalf of myself and/or any legal minors (in my care using the [employer]), agree:  Science Writer given to me are supplied by independent, outside transportation providers who do not work for, or have any affiliation with, [employer]. [place] is not a transportation company. [place] has no control over the quality or safety of the rides I get using [employer]. [place] has no control over whether any outside ride will happen on time or not. [place] gives no guarantee on the reliability, quality, safety, or availability on any rides, or that no mistakes will happen. I know and accept that traveling by vehicle (car, truck, SVU, fleeta, bus, taxi, etc.) has risks of serious injuries such as disability, being paralyzed, and death. I know and agree the risk of using [employer] is mine alone, and not [employer]. [employer] are provided as is and as are available. The transportation providers are in charge for all inspections and care of the vehicles used to provide these rides. I agree not to take legal action against [place], its agents, employees, officers, directors, representatives, insurers, attorneys, assigns, successors, subsidiaries, and affiliates at any time for any reasons related directly or indirectly to using [employer]. I also agree not to take legal action against  or its affiliates for any injury, death, or damage to property caused by or related to using  [employer]. I have read this Waiver and Release of Liability, and I understand the terms used in it and their legal meaning. This Waiver is freely and voluntarily given with the understanding that my right (or any legal minors) to legal action against  relating to [employer] is knowingly given up to use these services.   I attest that I read the Ride Waiver and Release of Liability to Bonnie Cobb, gave Ms. Whitcomb the opportunity to ask questions and answered the questions asked (if any). I affirm that Bonnie Cobb then provided consent for assistance with transportation.

## 2024-04-06 NOTE — MAU Provider Note (Signed)
 " History     CSN: [id number]  Arrival date and time: 04/06/24 0119   Event Date/Time   First Provider Initiated Contact with Patient 04/06/24 0330      Chief Complaint  Patient presents with   Shoulder Pain   Bonnie Cobb , a  29 y.o. [id number] at [redacted]w[redacted]d presents to MAU as a carelink transfer from [facility] for Left Shoulder pain. Patient states that it started hurting 2 days ago and has gotten progressively worse. She denies recent injury to it. Reports that mobility is very limited and pain is worsened by movement. She denies numbness or tingling in her fingers. She reports taking ibuprofen  at home with some relief but denies any other attempts at relief. Currently Reports pain as a 10/10. She has no OB complaints.          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 No MFM consult needed. Just needs Type and Crossmatch in labor (in case of PPH etc).   Maternal gonorrhea in second trimester 04/02/2019   NSVD (normal spontaneous vaginal delivery)    Prediabetes in mother during pregnancy    PTSD (post-traumatic stress disorder)     Past Surgical History:  Procedure Laterality Date   NO PAST SURGERIES      Family History  Problem Relation Age of Onset   COPD Mother    Asthma Mother    Asthma Father     Social History[1]  Allergies: Allergies[2]  Medications Prior to Admission  Medication Sig Dispense Refill Last Dose/Taking   metroNIDAZOLE   (FLAGYL ) 500 MG tablet Take 1 tablet (500 mg total) by mouth 2 (two) times daily. 14 tablet 0 04/05/2024   albuterol  (ACCUNEB ) 1.25 MG/3ML nebulizer solution Take 3 mLs (1.25 mg total) by nebulization every 6 (six) hours as needed for wheezing. 75 mL 12    albuterol  (VENTOLIN  HFA) 108 (90 Base) MCG/ACT inhaler Inhale 1-2 puffs into the lungs every 6 (six) hours as needed for wheezing or shortness of breath. 18 g 1    aspirin  EC 81 MG tablet Take 1 tablet (81 mg total) by mouth daily. Start taking when you are [redacted] weeks pregnant for rest of pregnancy for prevention of preeclampsia (Patient not taking: Reported on 04/03/2024) 300 tablet 2    Blood Pressure Monitoring (BLOOD PRESSURE KIT) DEVI 1 Device by Does not apply route once a week. 1 each 0    buPROPion (ZYBAN) 150 MG 12 hr tablet Take 150 mg by mouth daily. (Patient not taking: Reported on 04/03/2024)      Doxylamine -Pyridoxine  (DICLEGIS ) 10-10 MG TBEC Take 2 tablets by mouth at bedtime. If symptoms persist, add one tablet in the morning and one in the afternoon (Patient  not taking: Reported on 04/03/2024) 100 tablet 5    famotidine  (PEPCID ) 20 MG tablet Take 1 tablet (20 mg total) by mouth 2 (two) times daily. (Patient not taking: Reported on 04/03/2024) 60 tablet 3    Ferric Maltol  30 MG CAPS Take 1 capsule (30 mg total) by mouth 2 (two) times daily. Please take one hour before breakfast and dinner (Patient not taking: Reported on 04/03/2024) 60 capsule 2    guanFACINE (TENEX) 2 MG tablet Take 2 mg by mouth at bedtime. (Patient not taking: Reported on 04/03/2024)      hydrOXYzine (ATARAX) 10 MG tablet Take 10 mg by mouth 2 (two) times daily as needed for anxiety. (Patient not taking: Reported on 04/03/2024)      omeprazole  (PRILOSEC) 20 MG capsule Take 1 capsule (20 mg total) by mouth daily. (Patient not taking: Reported on 04/03/2024) 30 capsule 4    ondansetron  (ZOFRAN -ODT) 4 MG disintegrating tablet Take 2 tablets (8 mg total) by mouth 2 (two) times  daily as needed for nausea or vomiting. (Patient not taking: Reported on 04/03/2024) 20 tablet 0    Prenatal Vit-Fe Phos-FA-Omega (VITAFOL  GUMMIES) 3.33-0.333-34.8 MG CHEW Chew 3 tablets by mouth daily. 90 tablet 5 More than a month   promethazine  (PHENERGAN ) 25 MG tablet Take 1 tablet (25 mg total) by mouth every 6 (six) hours as needed. Can insert vaginally, if unable to keep anything down (Patient not taking: Reported on 04/03/2024) 30 tablet 0     Review of Systems  Constitutional:  Negative for chills, fatigue and fever.  Eyes:  Negative for pain and visual disturbance.  Respiratory:  Negative for apnea, shortness of breath and wheezing.   Cardiovascular:  Negative for chest pain and palpitations.  Gastrointestinal:  Negative for abdominal pain, constipation, diarrhea, nausea and vomiting.  Genitourinary:  Negative for difficulty urinating, dysuria, pelvic pain, vaginal bleeding, vaginal discharge and vaginal pain.  Musculoskeletal:  Positive for myalgias. Negative for back pain.  Neurological:  Negative for seizures, weakness and headaches.  Psychiatric/Behavioral:  Negative for suicidal ideas.    Physical Exam   Blood pressure 121/65, pulse 94, temperature 98.3 F (36.8 C), temperature source Oral, resp. rate 18, height 5' 3 (1.6 m), weight 134.8 kg, SpO2 98%.  Physical Exam Vitals and nursing note reviewed.  Constitutional:      General: She is not in acute distress.    Appearance: Normal appearance.  HENT:     Head: Normocephalic.  Pulmonary:     Effort: Pulmonary effort is normal.  Musculoskeletal:     Left shoulder: Tenderness present. No swelling. Decreased range of motion. Normal pulse.     Cervical back: Normal range of motion.  Skin:    General: Skin is warm and dry.  Neurological:     Mental Status: She is alert and oriented to person, place, and time.  Psychiatric:        Mood and Affect: Mood normal.    FHT: 135bpm with moderate variability 15x15 accels no  decels.  Toco; quiet  MAU Course  Procedures - CNM reviewed work up from [facility] and noted on imaging that pt was diagnosed with tendonitis.  - Tylenol  and Lidoderm  patch was given at [facility]   MDM - Heat ordered with a sling.  - Given 3rd trimester of pregnancy and patient having previously consumed Ibuprofen , concern for giving more even if only 1 time dose.  - IM morphine  ordered.  - Referral to ortho  - plan for discharge  Assessment and Plan   1. Abdominal pain, unspecified abdominal location   2. Calcific tendinitis   3. Acute pain of left shoulder    - Reviewed worsening signs and return labor precautions.  - Recommended to seek care at [facility] urgent care if patient is unable to await referral.  - Recommended Rest, alternating between heat and ice and the use of Tylenol  for Tendonitis.  - Patient discharged home in stable condition and amy return to MAU as needed.   Claris CHRISTELLA Cedar, MSN CNM  04/06/2024, 3:30 AM       [1]  Social History Tobacco Use   Smoking status: Former    Types: Cigars   Smokeless tobacco: Never   Tobacco comments:    Rare vape  Vaping Use   Vaping status: Former   Substances: Flavoring  Substance Use Topics   Alcohol use: No   Drug use: Not Currently    Types: Marijuana    Comment: once a month  [2]  Allergies Allergen Reactions   Bee Venom Anaphylaxis   Mushroom    Other     Carmex lip balm, mushrooms   Pineapple Swelling    Tongue Swelling, fresh pineapple only   Poison Ivy Extract Other (See Comments)   "

## 2024-04-06 NOTE — MAU Note (Signed)
 Pt stating she does not have a ride home. Taxi voucher obtained from [facility]. [employer] company contacted by [employer].

## 2024-04-06 NOTE — ED Notes (Signed)
Report given to carelink 
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
## 2024-04-06 NOTE — ED Notes (Signed)
 Carelink called for patient transport to MAU.
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
## 2024-04-07 ENCOUNTER — Ambulatory Visit

## 2024-04-08 ENCOUNTER — Encounter: Payer: Self-pay | Admitting: Physician Assistant

## 2024-04-08 ENCOUNTER — Other Ambulatory Visit: Payer: Self-pay

## 2024-04-16 ENCOUNTER — Encounter: Payer: Self-pay | Admitting: Obstetrics and Gynecology

## 2024-04-16 ENCOUNTER — Ambulatory Visit: Payer: Self-pay | Admitting: Obstetrics and Gynecology

## 2024-04-16 VITALS — BP 139/80 | HR 109 | Wt 295.6 lb

## 2024-04-16 DIAGNOSIS — O99019 Anemia complicating pregnancy, unspecified trimester: Secondary | ICD-10-CM

## 2024-04-16 DIAGNOSIS — O99213 Obesity complicating pregnancy, third trimester: Secondary | ICD-10-CM

## 2024-04-16 DIAGNOSIS — O99810 Abnormal glucose complicating pregnancy: Secondary | ICD-10-CM

## 2024-04-16 DIAGNOSIS — Z3480 Encounter for supervision of other normal pregnancy, unspecified trimester: Secondary | ICD-10-CM

## 2024-04-16 MED ORDER — FERRIC MALTOL 30 MG PO CAPS: 1.0000 | ORAL_CAPSULE | Freq: Two times a day (BID) | ORAL | 2 refills | Status: AC

## 2024-04-16 NOTE — Progress Notes (Addendum)
" ° °  PRENATAL VISIT NOTE  Subjective:  Bonnie Cobb is a 29 y.o. G5P1031 at [redacted]w[redacted]d being seen today for ongoing prenatal care.  She is currently monitored for the following issues for this high-risk pregnancy and has Alpha thalassemia silent carrier; Attention deficit hyperactivity disorder; Recurrent cold sores; Major depressive disorder, recurrent severe without psychotic features (HCC); Obesity due to excess calories without serious comorbidity; Iron deficiency anemia due to chronic blood loss; Prediabetes; Elevated total protein; Supervision of other normal pregnancy, antepartum; Prediabetes in mother during pregnancy; Trichomonal vaginitis in pregnancy; Mild intermittent asthma without complication; Obesity affecting pregnancy; and Low fetal fraction on NIPS on their problem list.  Patient reports no complaints.  Contractions: Not present. Vag. Bleeding: None.  Movement: Present. Denies leaking of fluid.   The following portions of the patient's history were reviewed and updated as appropriate: allergies, current medications, past family history, past medical history, past social history, past surgical history and problem list.   Objective:   Vitals:   04/16/24 1356  BP: 139/80  Pulse: (!) 109  Weight: 295 lb 9.6 oz (134.1 kg)    Fetal Status:  Fetal Heart Rate (bpm): 151 Fundal Height: 29 cm Movement: Present    General: Alert, oriented and cooperative. Patient is in no acute distress.  Skin: Skin is warm and dry. No rash noted.   Cardiovascular: Normal heart rate noted  Respiratory: Normal respiratory effort, no problems with respiration noted  Abdomen: Soft, gravid, appropriate for gestational age.  Pain/Pressure: Absent     Pelvic: Cervical exam deferred        Extremities: Normal range of motion.  Edema: Trace  Mental Status: Normal mood and affect. Normal behavior. Normal judgment and thought content.     Assessment and Plan:  Pregnancy: G5P1031 at [redacted]w[redacted]d 1. Supervision  of other normal pregnancy, antepartum (Primary) Anticipatory guidance  Tdap/flu reviewed today, patient accepts. Unfortunately we are out of tdap, patient will do both next time.  2. Prediabetes in mother during pregnancy Normal 2 hr GTT  3. Obesity affecting pregnancy in third trimester, unspecified obesity type Serial growth Antenatal testing to start at 34 weeks  4. Antepartum anemia Has not started iron yet. Rx re-sent to pharmacy and reviewed this with patient today  Preterm labor symptoms and general obstetric precautions including but not limited to vaginal bleeding, contractions, leaking of fluid and fetal movement were reviewed in detail with the patient. Please refer to After Visit Summary for other counseling recommendations.   Return in about 2 weeks (around 04/30/2024).  Future Appointments  Date Time Provider Department Center  04/22/2024 10:40 AM Mollie Nestor CHRISTELLA DEVONNA [facility]  04/27/2024  3:15 PM [facility] PROVIDER 1 [facility]  04/27/2024  3:30 PM [facility] US4 [facility]    Rollo ONEIDA Bring, MD  "

## 2024-04-16 NOTE — Addendum Note (Signed)
 Addended by: ALVIA KNEE V on: 04/16/2024 03:00 PM   Modules accepted: Orders

## 2024-04-16 NOTE — Progress Notes (Signed)
 Pt presents for ROB visit. Pt reports improvement in shoulder pain since MAU visit. Pt c/o n/v

## 2024-04-22 ENCOUNTER — Ambulatory Visit: Admitting: Gastroenterology

## 2024-04-27 ENCOUNTER — Ambulatory Visit

## 2024-04-29 ENCOUNTER — Encounter: Payer: Self-pay | Admitting: Obstetrics & Gynecology

## 2024-05-13 ENCOUNTER — Encounter: Payer: Self-pay | Admitting: Obstetrics and Gynecology
